# Patient Record
Sex: Male | Born: 1945 | Race: White | Hispanic: No | Smoking: Current every day smoker
Health system: Southern US, Community
[De-identification: ages and names within clinical notes are randomized; demographics above are authoritative.]

## PROBLEM LIST (undated history)

## (undated) ENCOUNTER — Emergency Department (HOSPITAL_COMMUNITY): Discharge status: Against Medical Advice | Payer: Self-pay

## (undated) DIAGNOSIS — F1020 Alcohol dependence, uncomplicated: Secondary | ICD-10-CM

## (undated) DIAGNOSIS — I10 Essential (primary) hypertension: Secondary | ICD-10-CM

---

## 1998-11-08 ENCOUNTER — Encounter: Payer: Self-pay | Admitting: Orthopedic Surgery

## 1998-11-11 ENCOUNTER — Observation Stay (HOSPITAL_COMMUNITY): Admission: RE | Admit: 1998-11-11 | Discharge: 1998-11-12 | Payer: Self-pay | Admitting: Orthopedic Surgery

## 2001-11-03 ENCOUNTER — Ambulatory Visit (HOSPITAL_COMMUNITY): Admission: RE | Admit: 2001-11-03 | Discharge: 2001-11-04 | Payer: Self-pay | Admitting: Cardiovascular Disease

## 2001-11-03 ENCOUNTER — Encounter: Payer: Self-pay | Admitting: Cardiovascular Disease

## 2007-08-15 ENCOUNTER — Emergency Department (HOSPITAL_COMMUNITY): Admission: EM | Admit: 2007-08-15 | Discharge: 2007-08-15 | Payer: Self-pay | Admitting: Emergency Medicine

## 2008-06-27 ENCOUNTER — Ambulatory Visit: Payer: Self-pay | Admitting: Cardiology

## 2008-06-27 ENCOUNTER — Inpatient Hospital Stay (HOSPITAL_COMMUNITY): Admission: EM | Admit: 2008-06-27 | Discharge: 2008-06-28 | Payer: Self-pay | Admitting: Emergency Medicine

## 2008-07-19 DIAGNOSIS — J449 Chronic obstructive pulmonary disease, unspecified: Secondary | ICD-10-CM | POA: Insufficient documentation

## 2008-07-19 DIAGNOSIS — I10 Essential (primary) hypertension: Secondary | ICD-10-CM | POA: Insufficient documentation

## 2008-07-19 DIAGNOSIS — J42 Unspecified chronic bronchitis: Secondary | ICD-10-CM

## 2008-12-28 ENCOUNTER — Emergency Department (HOSPITAL_COMMUNITY): Admission: EM | Admit: 2008-12-28 | Discharge: 2008-12-29 | Payer: Self-pay | Admitting: Emergency Medicine

## 2009-05-23 IMAGING — CT CT ABDOMEN W/ CM
1 of 3 series · 14 of 32 positions shown, 19 images · IV contrast (agent unspecified)
Comparison: None

CT ABDOMEN

CLINICAL DATA: Nominal pain and diarrhea.

CT ABDOMEN AND PELVIS WITH CONTRAST
TECHNIQUE: Multidetector CT imaging of the abdomen and pelvis was
performed using the standard protocol following bolus
administration of intravenous contrast.
Contrast: 100 ml Imnipaque-W77 and oral contrast

[Series 2: abd_pel 5.0 b40f st · axial · 0.72mm/px · z∈[-465,-20]mm · 14 of 99 slices shown, 19 images]
[im 5/99  soft-tissue]
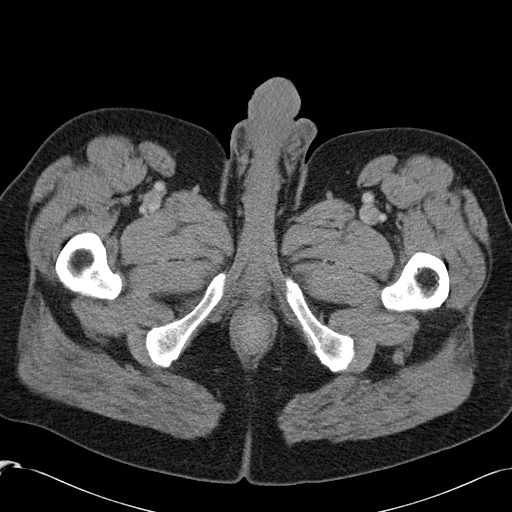
[im 5/99  bone]
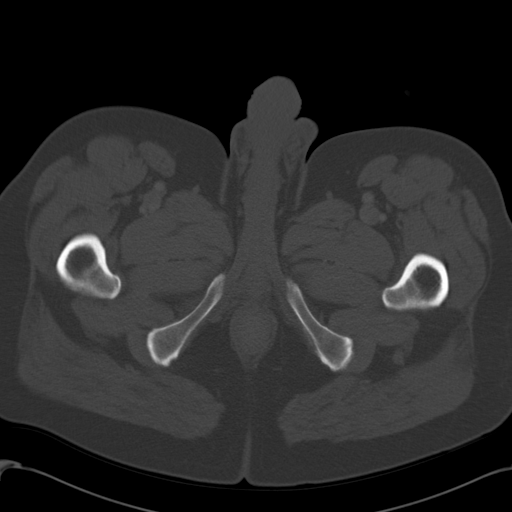
[im 15/99  soft-tissue]
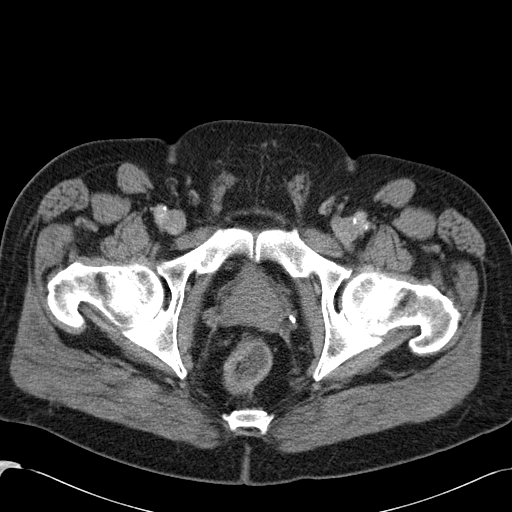
[im 20/99  soft-tissue]
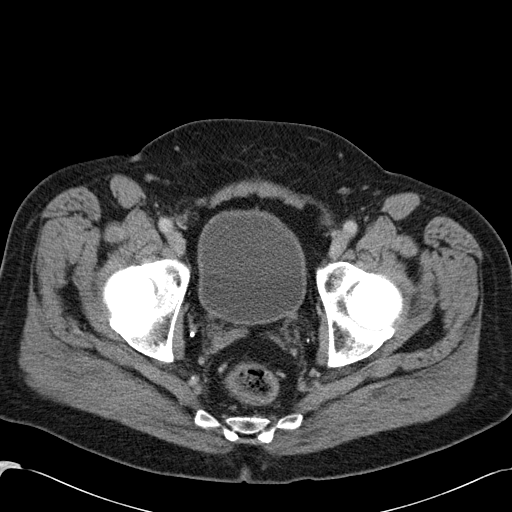
[im 30/99  soft-tissue]
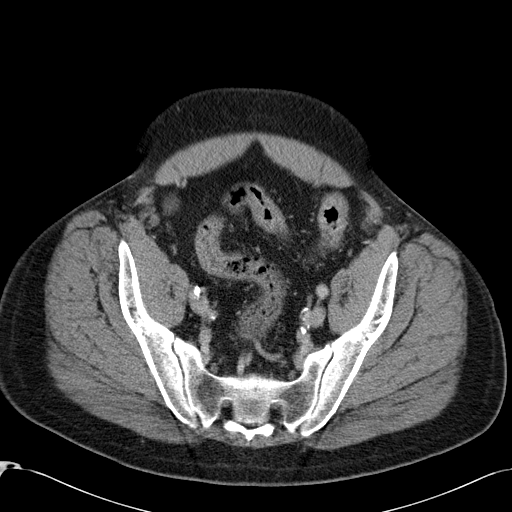
[im 35/99  soft-tissue]
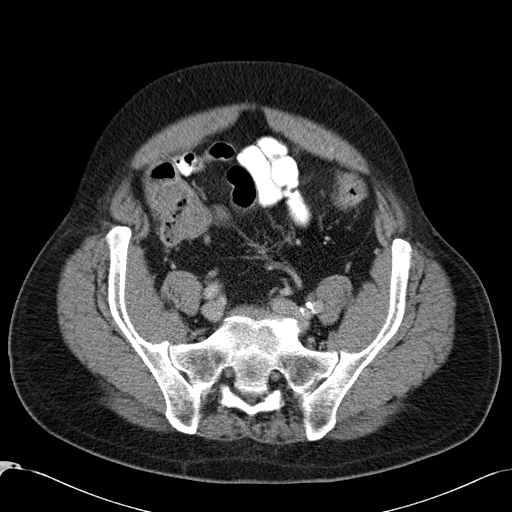
[im 45/99  soft-tissue]
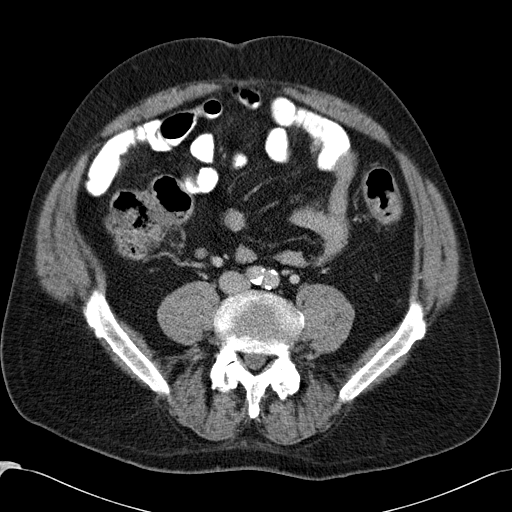
[im 50/99  soft-tissue]
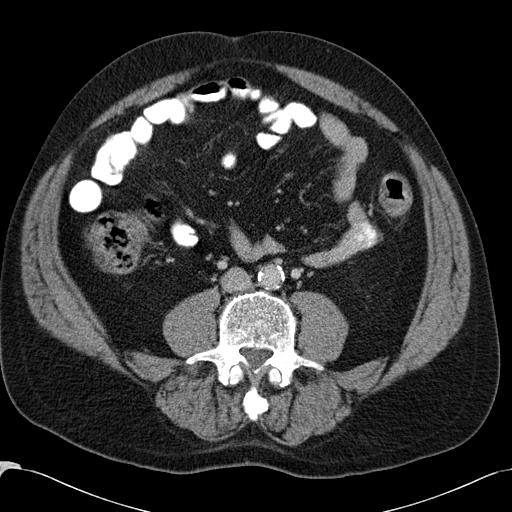
[im 54/99  soft-tissue]
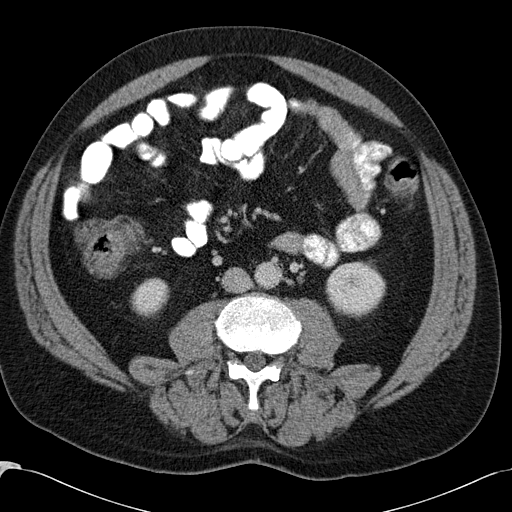
[im 64/99  soft-tissue]
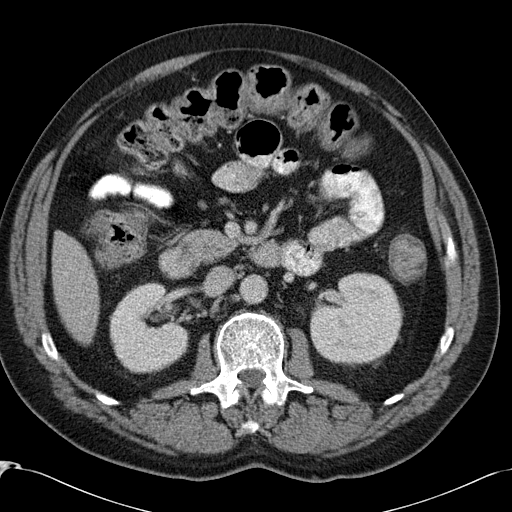
[im 64/99  bone]
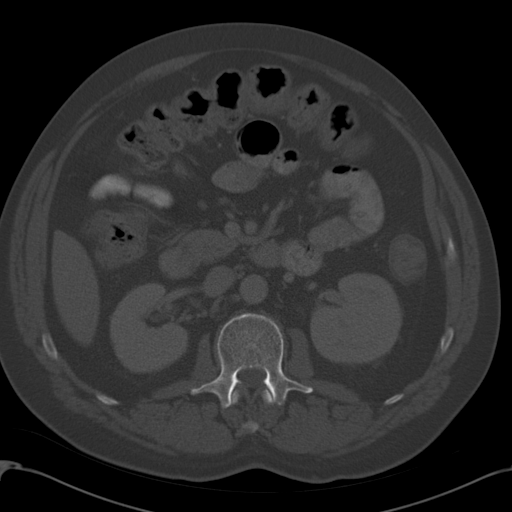
[im 69/99  soft-tissue]
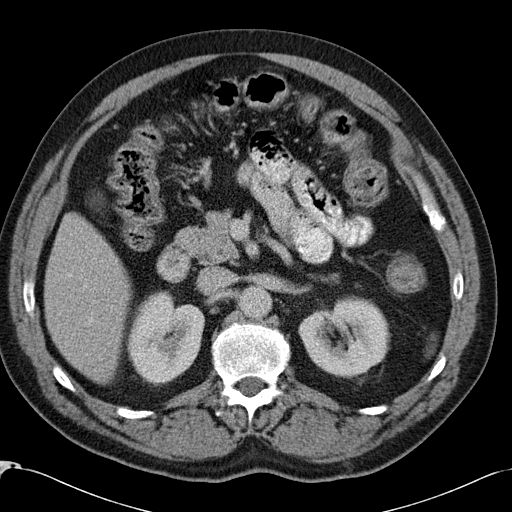
[im 79/99  soft-tissue]
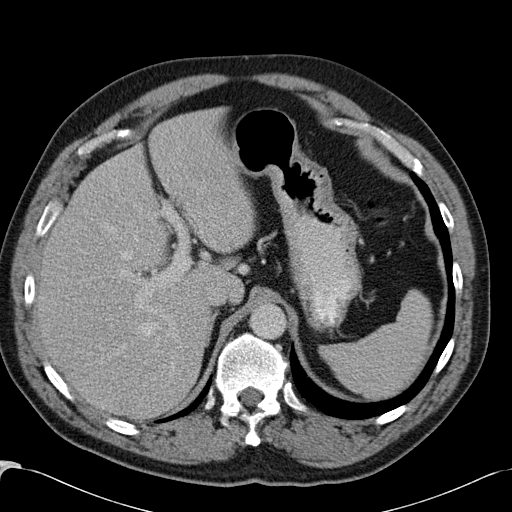
[im 79/99  lung]
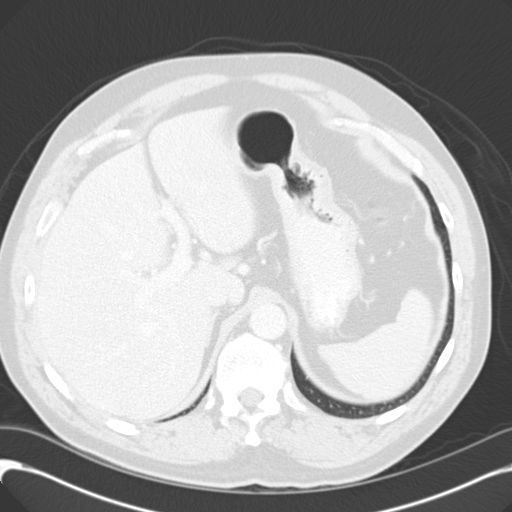
[im 84/99  soft-tissue]
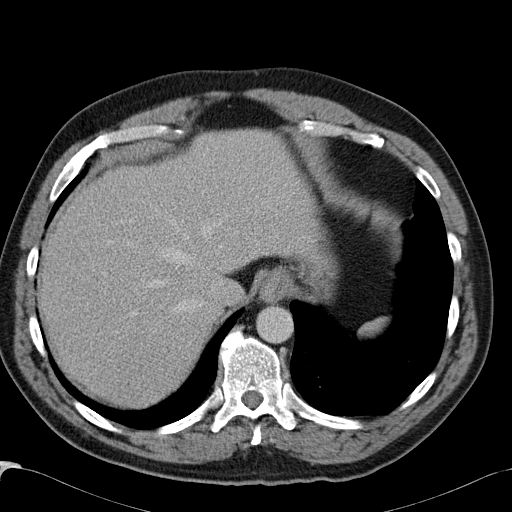
[im 84/99  lung]
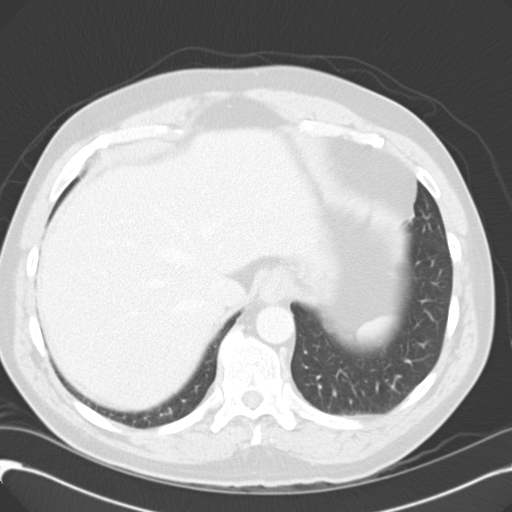
[im 89/99  lung]
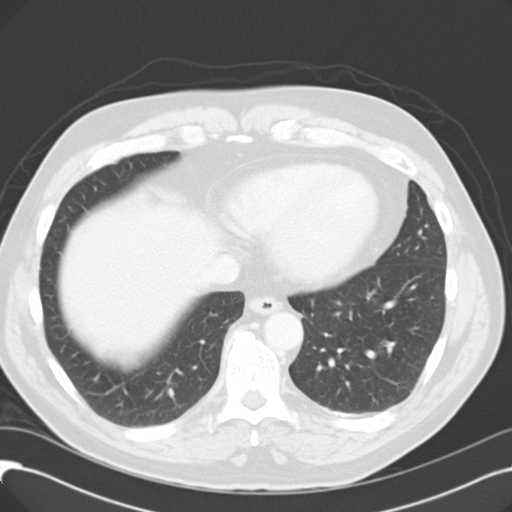
[im 94/99  soft-tissue]
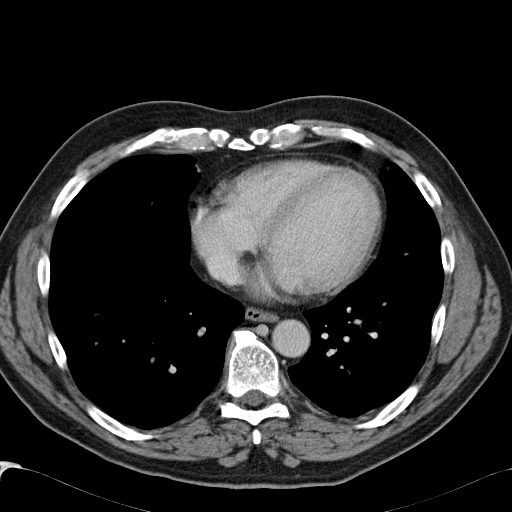
[im 94/99  lung]
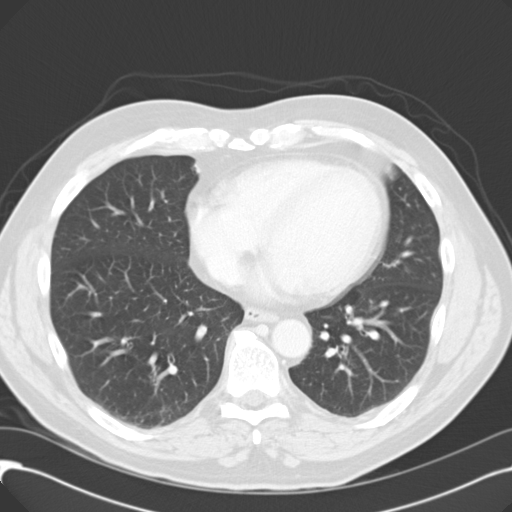

[14 of 32 positions shown; findings below may reference images not displayed]

FINDINGS: A large calcified granulomas seen in the posterior right
lower lobe.  A tiny less than 1 cm cyst is seen in the lower pole
right kidney.  The kidneys are otherwise normal appearance there is
no evidence of hydronephrosis.  The other abdominal parenchymal
organs are normal appearance.

There is no evidence of soft tissue masses or adenopathy.  There is
no evidence of inflammatory process or abnormal fluid collections.
Abdominal bowel loops are unremarkable appearance.
IMPRESSION: No acute findings.

CT PELVIS
FINDINGS: There is no evidence of pelvic mass or lymphadenopathy.
There is no evidence of inflammatory process or abnormal fluid
collections.  Pelvic bowel loops are unremarkable in appearance.
IMPRESSION: No acute findings.

## 2010-04-05 IMAGING — CR DG CHEST 1V PORT
1 series · 1 of 1 positions shown · non-contrast
Comparison: None

CLINICAL DATA: Chest pain.

PORTABLE CHEST - 1 VIEW

[AP]
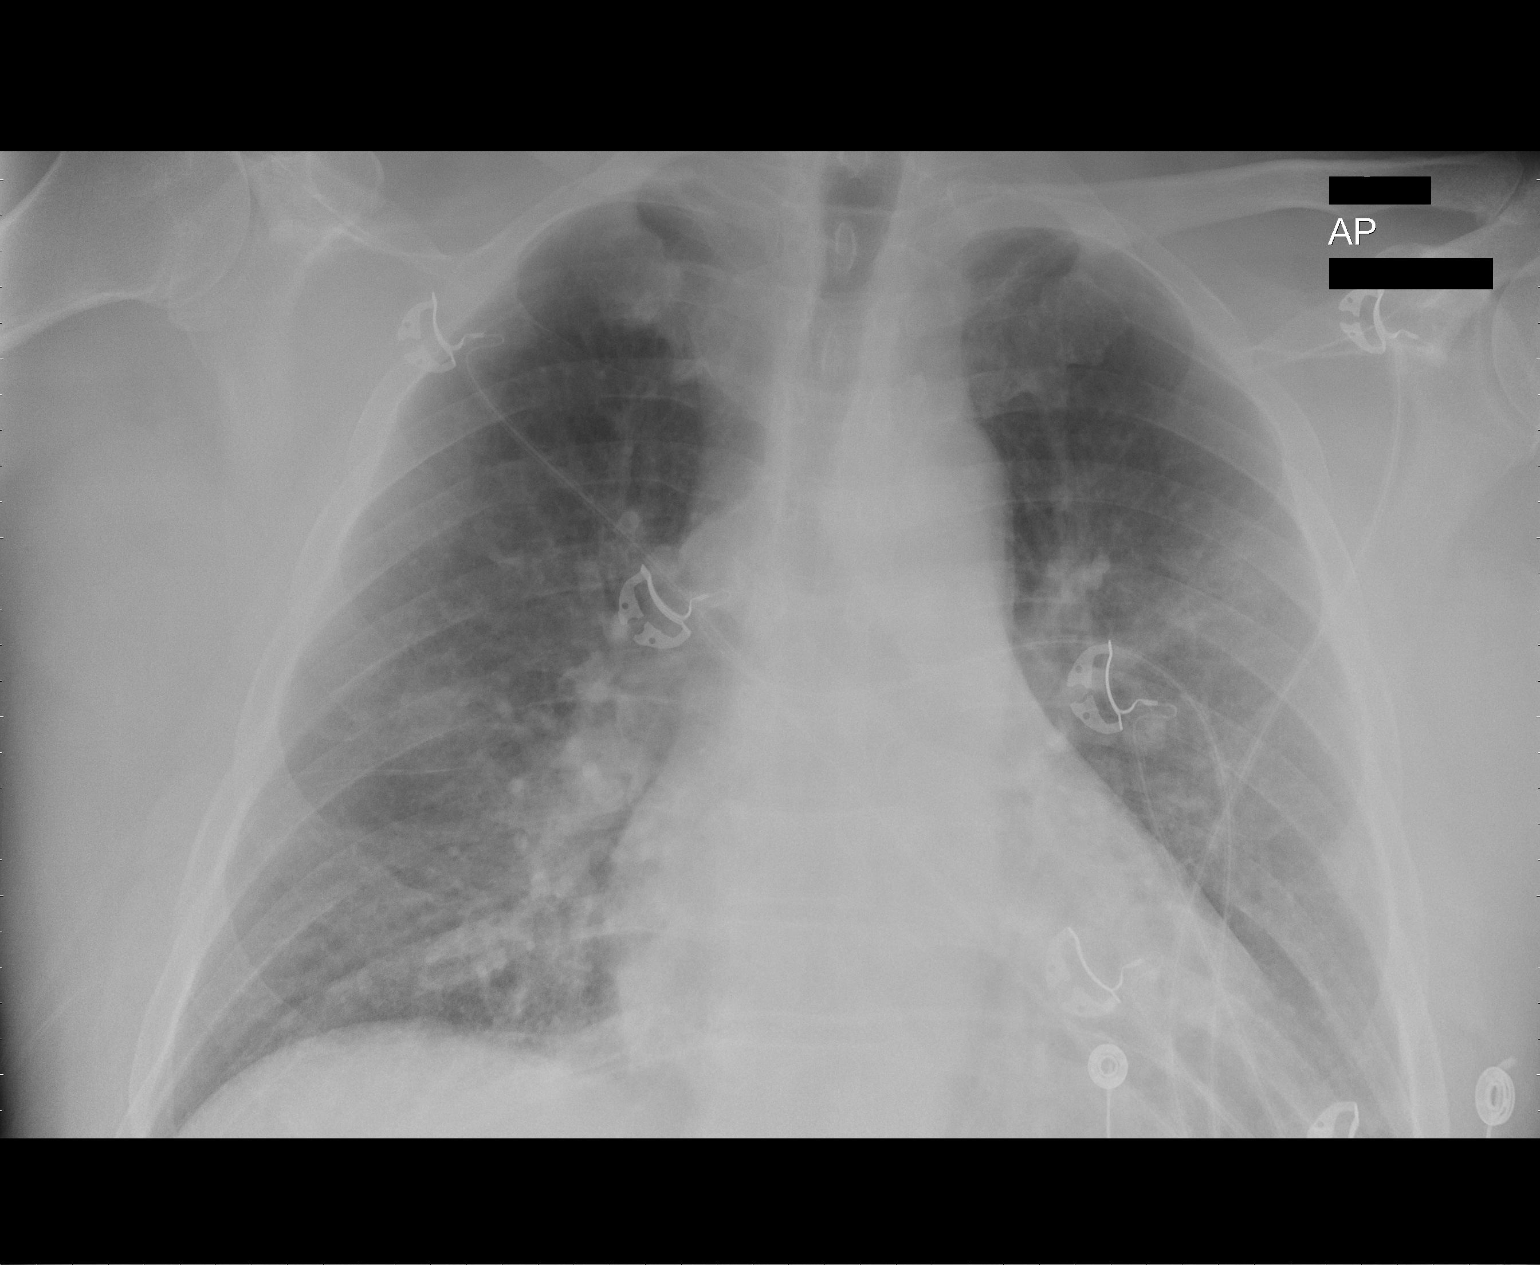

[1 of 1 positions shown; findings below may reference images not displayed]

FINDINGS: Cardiomegaly and moderate vascular congestion.  No
pulmonary edema or infiltrate.
IMPRESSION: Cardiomegaly and vascular congestion.

## 2010-07-09 LAB — DIFFERENTIAL
Eosinophils Absolute: 0 10*3/uL (ref 0.0–0.7)
Eosinophils Relative: 0 % (ref 0–5)
Lymphs Abs: 0.9 10*3/uL (ref 0.7–4.0)
Monocytes Relative: 4 % (ref 3–12)

## 2010-07-09 LAB — CARDIAC PANEL(CRET KIN+CKTOT+MB+TROPI)
CK, MB: 35.4 ng/mL — ABNORMAL HIGH (ref 0.3–4.0)
CK, MB: 39.3 ng/mL — ABNORMAL HIGH (ref 0.3–4.0)
Relative Index: 13.9 — ABNORMAL HIGH (ref 0.0–2.5)
Total CK: 270 U/L — ABNORMAL HIGH (ref 7–232)

## 2010-07-09 LAB — LIPID PANEL
HDL: 48 mg/dL
Total CHOL/HDL Ratio: 4.2 ratio
Triglycerides: 64 mg/dL
VLDL: 13 mg/dL (ref 0–40)

## 2010-07-09 LAB — COMPREHENSIVE METABOLIC PANEL
ALT: 25 U/L (ref 0–53)
AST: 30 U/L (ref 0–37)
Albumin: 3.2 g/dL — ABNORMAL LOW (ref 3.5–5.2)
Calcium: 8.5 mg/dL (ref 8.4–10.5)
GFR calc Af Amer: >60 mL/min (ref >60)
Sodium: 136 mEq/L (ref 135–145)
Total Protein: 6 g/dL (ref 6.0–8.3)

## 2010-07-09 LAB — CBC
MCHC: 35 g/dL (ref 30.0–36.0)
RDW: 13.9 % (ref 11.5–15.5)

## 2010-07-09 LAB — HEMOGLOBIN A1C
Hgb A1c MFr Bld: 5.5 % (ref 4.6–6.1)
Mean Plasma Glucose: 111 mg/dL

## 2010-07-09 LAB — PROTIME-INR: INR: 1 (ref 0.00–1.49)

## 2010-07-09 LAB — GLUCOSE, CAPILLARY: Glucose-Capillary: 139 mg/dL — ABNORMAL HIGH (ref 70–99)

## 2010-07-09 LAB — TSH: TSH: 0.27 u[IU]/mL — ABNORMAL LOW (ref 0.350–4.500)

## 2010-07-10 LAB — POCT I-STAT, CHEM 8
Calcium, Ion: 0.97 mmol/L — ABNORMAL LOW (ref 1.12–1.32)
Chloride: 102 mEq/L (ref 96–112)
HCT: 49 % (ref 39.0–52.0)
Hemoglobin: 16.7 g/dL (ref 13.0–17.0)

## 2010-07-10 LAB — POCT CARDIAC MARKERS: Troponin i, poc: <0.05 ng/mL (ref 0.00–0.09)

## 2010-08-12 NOTE — Discharge Summary (Signed)
NAME:  NAS, WAFER NO.:  [id number]   MEDICAL RECORD NO.:  [id number]          PATIENT TYPE:  INP   LOCATION:  2913                         FACILITY:  [facility]   PHYSICIAN:  Luis Abed, MD, FACCDATE OF BIRTH:  03/16/1946   DATE OF ADMISSION:  06/27/2008  DATE OF DISCHARGE:  06/28/2008                               DISCHARGE SUMMARY   CARDIOLOGIST:  Luis Abed, MD, [facility].   PREVIOUS CARDIOLOGIST:  Richard A. Alanda Amass, MD   DISCHARGE DIAGNOSES:  1. Coronary artery disease/ST elevated myocardial infarction this      admission.  The patient with acute myocardial infarction due to      posterior lateral ruptured plaque, underwent cardiac      catheterization on day of admission with successful percutaneous      angioplasty only of the small vessel.  He has diffuse left coronary      artery disease.  See cardiac catheterization report for further      details.  Note, the patient is not clinically ready for discharge,      but is insistent on leaving and is being discharged with      prescriptions provided.  2. Alcoholism, ongoing.  3. Tobacco abuse, ongoing.  4. Medical noncompliance.  The patient is apparently on medications at      home prescribed through the [place], but he is not taking them.   PAST MEDICAL HISTORY:  1. Anxiety.  2. Previous cardiac catheterization in 2003.      a.     Coronary artery disease with high-grade mid circumflex       stenosis, culprit lesion treated successfully with percutaneous       transluminal angioplasty and drug-eluting stent in 2003.  3. Ongoing tobacco abuse.  4. GERD.  5. EtOH abuse.   HOSPITAL COURSE:  [name] Oneal is a 65 year old Caucasian gentleman with  previous history as stated above.  He is an unfortunate gentleman.  He  is an alcoholic.  He is a previous [place] veteran with anxiety issues  who presented to the emergency room with an acute MI.  The patient was  stabilized and taken to the cardiac  catheterization lab.  The patient  with improvement in the ST elevated MI as medications were started.  In  the cardiac catheterization lab, the patient was found to have diffuse  30% plaquing to the proximal vessel of the RCA, moderate diffuse  irregularity distally with another 30% area of narrowing, mild  irregularity in the PDA, the posterior lateral branch moderate size  vessel, 2 tiny branches and moderate-sized branch that had subtotal  occlusion following the dilatation with a 2-mm balloon, this was reduced  to less than 50% with evidence of TIMI-III flow.  Left main coronary was  calcified and tapered.  There is both ostial circumflex and LAD disease.  The distal left main was about 40% narrow, ostium of the LAD had 50-60%  narrowing that opens with aneurysmal dilatation, the diagonal itself had  50% narrowing, circumflex mild irregularity.  There was probably 30-40%  mid circumflex plaquing, the stent  placed in the second of the larger  marginal branches was widely patent.  EF around 60%.  The patient with  acute myocardial infarction due to posterior lateral ruptured plaque  underwent successful percutaneous angioplasty of the small vessel only  as described above.  The patient with multiple lifestyle issues, will  need close follow up at the [facility] for further management.  The patient  admitted to Intensive Care Unit from the cath lab, remained stable post-  cath entirely, and was discontinued.  Nitroglycerin was weaned off,  major concern for DTs in the setting of EtOH abuse.  The patient  remained tachycardic.  Blood pressure stable.  The patient adamant and  agitated, became combative to pulling out IVs, difficult to manage,  security notified to help monitor the patient's status, attempted  tobacco cessation educator, but the patient is not cooperative.  On the  evening of June 28, 2008, the patient stable, demanding to go home,  being obtuse and belligerent with staff, Dr.  Myrtis Ser in to see the patient.  The patient will not tolerate being in the hospital.  He felt the  patient would be better off at home with his family, therefore, the  patient is being given instructions and prescriptions and being allowed  to return home.  He will need to follow up outpatient with the [place].  I  have written for the patient 20 tablets of Ativan, to follow a detox  protocol, which has been specifically written out on his pink discharge  instructions as to how to taper the dosing.  We will not be giving him  any refills on this medication.  He will have to follow up with the [place].   He is instructed to continue aspirin 325, Lipitor 80, and Toprol-XL 50,  nitroglycerin p.r.n. I have given him prescriptions for 30 day supply on  the Lipitor and Toprol and 25 tablets of the nitroglycerin, Ativan for  alcohol detox is prescribed.  [facility] [place]  Post-cardiac catheterization instructions have been given to the patient  also.   Duration of discharge encounter is over 30 minutes.      [name] Oneal, ACNP      Luis Abed, MD, [facility]  Electronically Signed    MB/MEDQ  D:  06/28/2008  T:  06/29/2008  Job:  [id number]   cc:   [facility]

## 2010-08-12 NOTE — Cardiovascular Report (Signed)
NAME:  LENIS, NETTLETON NO.:  [id number]   MEDICAL RECORD NO.:  [id number]           PATIENT TYPE:   LOCATION:                                 FACILITY:   PHYSICIAN:  Arturo Morton. Riley Kill, MD, FACCDATE OF BIRTH:  Jan 24, 1946   DATE OF PROCEDURE:  DATE OF DISCHARGE:                            CARDIAC CATHETERIZATION   INDICATIONS:  Mr. Bulman is a 65 year old who has previously undergone  stenting of the circumflex artery.  He is followed at the [place].  He  presented to the emergency room with chest pain and some isolated ST  elevation in the inferior leads.  He was called as a code STEMI, and was  promptly seen by Dr. Myrtis Ser and myself.  At the same time, Dr. Margretta Ditty  had a second patient with marked ST elevation in the anterior precordial  leads, with back pain that he sent for a CT scan.  The patient also had  ongoing chest pain and the EKG was suggestive of anterior infarction.  As a result, the cath lab team had been called in, and the patient based  on the electrocardiogram was taken promptly to the catheterization  laboratory.  In the meantime, Dr. Arvilla Meres joined Dr. Myrtis Ser.  They began to arrange for a second call team while we were completing  the first anterior wall infarction case.  The patient was brought to the  catheterization laboratory.  Of note, the patient had some modification  of his pain, and improvement in his ST segments.  His EKG was not  considered a high-risk inferior infarction.  He was brought to the  laboratory by the second call team, and the case was started by Dr.  Arvilla Meres.  The patient arrived to the [facility] Emergency Room at  1854.  The patient arrived in the cath lab at 2055.  Case was started by  Dr. Gala Romney at 2107.  I came over from the other laboratory at 2138  and the first balloon was placed to 2150.  Delay in treatment was  related to one team with 2 acute myocardial infarctions.   PROCEDURE:  1. Left heart  catheterization.  2. Selective coronary arteriography.  3. Selective left ventriculography.  4. Intracoronary nitroglycerin administration.  5. Percutaneous angioplasty of the posterolateral artery and the right      coronary artery.   DESCRIPTION OF PROCEDURE:  The patient was brought to the  catheterization laboratory.  The case was started by Dr. Gala Romney.  He  entered the right femoral artery through a single anterior puncture.  A  6-French sheath was placed.  There was some oozing around the sheath,  and a moderate hematoma developed.  Views of the left and right coronary  arteries were obtained as well as ventriculography.  Intracoronary  nitroglycerin was administered in the posterolateral branch.  Following  this, he and I discussed the case and I elected to recommend a  percutaneous angioplasty of the posterolateral vessel.  The patient had  already received both unfractionated heparin and eptifibatide 50 units  per kilo were given to prolong the ACT  greater than 200 seconds.  The 6-  French sheath was upgraded to a 7-French sheath because of the using.  A  JR-4 guiding catheter with side holes was utilized.  ACT was rechecked  and found to be appropriate for percutaneous intervention.  The patient  also received 300 mg of oral clopidogrel.  A second bolus of entire room  was administered.  We then used a traverse wire to cross the total  occlusion and dilatations were done with both a 1.5-mm apex and 2.0-mm  apex balloons.  The latter was done for 3 minutes, and after  intracoronary nitroglycerin there was fairly marked improvement in the  appearance of the artery.  Given the 2-mm vessel, we elected not to  place a stent.  His pain was relieved, and he was markedly improved.  All catheters were subsequently removed, and the femoral sheath was sewn  into place.  There were no major complications.   HEMODYNAMIC DATA:  1. Initial central aortic pressure 169/89, mean 120.  2.  Left ventricular pressure 177/21 with an LVEDP of 30.  3. There was no gradient on pullback across the aortic valve noted.   ANGIOGRAPHIC DATA:  1. The right coronary artery is the infarct-related artery.  There is      diffuse 30% plaquing throughout the proximal vessel.  There are      some moderate diffuse irregularity distally with another 30% area      of narrowing.  There is mild irregularity in the mid PDA.  The      posterolateral branch is a moderate-sized vessel that is 2 mm.      There are 2 tiny branches and a moderate-sized branch that has a      subtotal occlusion.  Following the dilatation with a 2-mm balloon,      this was reduced to less than 50% with evidence of TIMI III flow.  2. The left main coronary artery is calcified and tapers so that there      is both ostial circumflex and LAD disease.  The distal left main is      about 40% narrow.  3. The ostium of the left anterior descending artery has about 50-60%      narrowing that opens up with aneurysmal dilatation.  The ostial      lesion is calcified.  There is another 30% area of narrowing      leading in the LAD at the takeoff of the diagonal.  The diagonal      itself has 50% narrowing.  At intramyocardial portion of the LAD      distally, there is about 40% narrowing.  4. The circumflex ostium has mild irregularity and then there is      diffuse luminal irregularity prior to bifurcation into 2 large      marginal branches.  There is a smaller marginal branch and a small      intermediate vessel as well, none of which have critical disease      but diffuse luminal irregularities.  There probably 30-40% mid      circumflex plaquing.  The stent placed in the second of the larger      marginal branches is widely patent with less than 0% narrowing.   Ventriculography was performed in the RAO projection.  Overall, systolic  function was preserved.  There was a very small wall motion abnormality.  Ejection fraction  would be estimated at around 60%.   CONCLUSION:  1. Acute myocardial infarction due to posterolateral ruptured plaque.  2. Successful percutaneous angioplasty only of the small vessel.  3. Diffuse left coronary artery disease as described in the above      text.   DISPOSITION:  The patient has multiple lifestyle issues.  His mother  admits that he has some difficulty with good lifestyle habits.  He will  need followup at the [place].      Arturo Morton. Riley Kill, MD, [facility]  Electronically Signed     TDS/MEDQ  D:  06/27/2008  T:  06/28/2008  Job:  [id number]

## 2010-08-12 NOTE — H&P (Signed)
NAME:  [name] Oneal, [name] NO.:  [id number]   MEDICAL RECORD NO.:  [id number]          PATIENT TYPE:  EMS   LOCATION:  MAJO                         FACILITY:  [facility]   PHYSICIAN:  Luis Abed, MD, FACCDATE OF BIRTH:  [birth year]   DATE OF ADMISSION:  06/27/2008  DATE OF DISCHARGE:                              HISTORY & PHYSICAL   Mr. [name] Oneal presented to the emergency room with an acute MI.  He has a  history of coronary disease.  In 2003, he received a Cypher stent to the  circumflex.  He had normal LV function at that time.  The patient  continues to smoke.  Currently, he is smoking 1-1/2 packs per day.  He  does also drink alcohol.  He is very honest about it and admits to one  pint per day.  He has chronic bronchitis.  However, recently he has been  feeling very well.  On June 26, 2008, he began having chest pain in the  evening.  He had pain throughout the evening.  Then late today on June 27, 2008, he felt worse and came to the hospital.  It is difficult to  say if there was a definite time to the change in his pain pattern, but  it may have been at 5 o'clock this afternoon.  He presented into the  emergency room in the range of 7.  His EKG shows inferior ST elevation  compatible with an inferior MI.  The patient was managed very rapidly.  With the addition of aspirin, heparin and nitroglycerin his pain began  to improve.  Therefore, a decision was made to continue giving him meds  to stabilize his situation.  Lytic therapy was considered, but for  several reasons it was felt that this was not appropriate.  Because he  was improving and because there was a simultaneous acute anterior MI  with pulmonary edema, decision is made on the emergent basis to continue  to watch his improvement on medications over the next short period of  time.   PAST MEDICAL HISTORY:   ALLERGIES:  NO KNOWN DRUG ALLERGIES.   MEDICATIONS:  Aspirin 81 mg.   OTHER MEDICAL PROBLEMS:   Hypertension and smoking.   SOCIAL HISTORY:  The patient lives in the area with his wife.   FAMILY HISTORY:  Positive for coronary disease.   REVIEW OF SYSTEMS:  He has not had any fevers or chills.  He is not  having any headaches.  He has not had skin rashes.  He does have  problems with chronic bronchitis.  There has been no change in his  weight.  He has no visual changes or hearing changes.  He has had no  significant GI symptoms.  There is no GU symptoms.  He had no major  musculoskeletal problems.  All other review of systems were interviewed  an asked and are all negative.   PHYSICAL EXAMINATION:  VITAL SIGNS:  Initially blood pressure was  extremely high at 224/118.  It is coming down progressively and  currently in the range of 160/80, and  his pulse is now in the range of  70.  GENERAL:  The patient is oriented to person, time and place.  Affect is  normal.  His wife is here with him in the room.  HEENT:  Reveals no xanthelasma.  He has normal extraocular motion.  NECK:  There are no carotid bruits.  There is no jugulovenous  distention.  LUNGS:  Clear.  Respiratory effort is not labored.  CARDIAC:  Reveals S1-S2.  There are no clicks or significant murmurs.  ABDOMEN:  Soft.  EXTREMITIES:  He has no significant peripheral edema.   LABORATORY DATA:  BUN is 13.  Creatinine 1.1.  Hemoglobin is 16.7.  His  first EKG reveals 2 mm of inferior ST elevation with some reciprocal ST  depression.  His most recent EKG was in the range of 1 mm of ST  elevation and soon another repeat will be done.   PROBLEMS:  1. Hypertension, severe with admission, improving.  2. History of coronary disease, status post circumflex stent with a      Cypher stent in 2003.  3. History of good left ventricular function.  4. History of smoking that continues at 1-1/2 packs per day.  5. History of alcohol use.  He still drinks one pint per day.  We will      need to follow alcohol withdrawal  precautions carefully.  6. Chronic bronchitis.  7. ** Acute inferior ST elevation myocardial infarction with continued      improvement in the ST-segment elevation as medications are started.      The patient will be treated very carefully and aggressively over      the next 30 minutes to 1 hour as decisions are made on the best      approach to his care considering all factors, including the fact      that there is an acute anterior MI in the cath lab with pulmonary      edema.  The patient has received aspirin and IV heparin.  There is      IV nitroglycerin.  He is getting Integrilin at this time.  Beta-      blocker is being used because he is tachycardiac.      Luis Abed, MD, [facility]  Electronically Signed     JDK/MEDQ  D:  06/27/2008  T:  06/27/2008  Job:  [id number]

## 2010-08-15 NOTE — Cardiovascular Report (Signed)
NAME:  [name] Oneal, [name] Oneal                    ACCOUNT NO.:  [id number]   MEDICAL RECORD NO.:  [id number]                   PATIENT TYPE:  OIB   LOCATION:  2899                                 FACILITY:  [facility]   PHYSICIAN:  Richard A. Alanda Amass, M.D.          DATE OF BIRTH:  12/20/1945   DATE OF PROCEDURE:  11/03/2001  DATE OF DISCHARGE:                              CARDIAC CATHETERIZATION   PROCEDURE:  1. Retrograde central aortic catheterization.  2. Selective coronary angiography pre and post intracoronary nitroglycerin     administration.  3. Left ventricular angiogram, RAO/LAO projection.  4. Abdominal angiogram, hand injection, PA projection.  5. Angiomax bolus plus infusion.  6. Aspirin and Plavix p.o.  7. Percutaneous coronary intervention with percutaneous transluminal     coronary angioplasty and subsequent coronary stenting, high-grade mid     circumflex stenosis.  8. Attempted Perclose, right femoral artery, 6-French single suture closure.   DESCRIPTION OF PROCEDURE:  The patient was brought to the second floor CP  lab in a postabsorptive state after 5 mg Valium p.o. premedication.  He took  an aspirin a day prior to the procedure and aspirin the day of the procedure  and was given three baby aspirin in the laboratory.  Xylocaine, 1%, was used  for local anesthesia.  The RFA was entered with a single anterior puncture  using a #18 thin-walled needle and a 6-French short Daig sidearm sheath were  inserted without difficulty.  Catheterization was done with a 6-French 4 cm  taper, Cordis preformed coronary and pigtail catheters using Omnipaque dye  throughout the procedure.  LV angiogram was done in the RAO and LAO  projection at 25 cc/14 cc per second, 20 cc/12 cc per second.  Pullback  pressure of the CA showed no gradient across the aortic valve.  Subselective  LIMA and left subclavian injection was done with a right coronary catheter  by hand injection.   Abdominal angiogram was done with a pigtail catheter above the level of the  renal arteries by hand injection demonstrating single normal renal arteries  bilaterally and normal-appearing infrarenal abdominal aorta and iliac  bifurcation with no aneurysm.  The patient was given 2 mg of Nubain for  sedation during the diagnostic procedure and given 200 mcg of IC  nitroglycerin with the left coronary repeat injections obtained.   He tolerated the procedure well.   PRESSURES:  1. LV 155/0; LVEDP 18 mmHg.  2. CA 155/85 mmHg.  3. There was no gradient across the aortic valve on catheter pullback.   LEFT VENTRICULOGRAPHY:  LV angiogram in the RAO and LAO projection  demonstrated a vigorously and normally contracting left ventricle with EF  greater than 55% and no segmental wall motion abnormalities.  No mitral  regurgitation was present.   ANGIOGRAPHIC RESULTS:  1. The LIMA was widely patent.  There was no subclavian stenosis.  There was     antegrade left vertebral flow.  2.  Fluoroscopy demonstrated +2 calcification of the proximal LAD, +1     calcification of the proximal circumflex, and +1 calcification of the     proximal RCA.  There was no intracardiac or valvular calcification seen.  3. The main left coronary was normal.  4. The left anterior descending artery had mild to moderate aneurysmal     dilatation segmentally of the proximal LAD approximately 5 mm after its     origin extending before the first septal perforator branch.  There was     some streaming of flow through this but no thrombus present.  Careful     examination in multiple views of the ostial and proximal LAD revealed a     probable normal-appearing segment compared to the referenced LAD beyond     the first diagonal.  There was some minimal haziness in this area but no     thrombus present and good flow.  The remainder of the LAD had no     significant stenosis and coursed to the apex of the heart where it      bifurcated.  The first septal perforator branch arose opposite the large     first diagonal branch which trifurcated and was normal.  5. The circumflex artery gave off two small marginal branches proximally     followed by an atrial branch.  The third marginal branch was moderately     large and bifurcated and was normal.  Just beyond this in the mid     circumflex was a 95% segmental subtotal stenosis.  No thrombus was     evident and there was TIMI-3 flow to the distal circumflex which     bifurcated, was moderately large, and a small PABG branch.  6. The right coronary artery was a large dominant vessel.  There was 30%     narrowing in the proximal, proximal-mid, and mid portion beyond the RV     branch.  The remainder of the vessel was large, widely patent, with no     significant stenosis with good flow to a large bifurcating PLA and PDA.   DISCUSSION:  This 65 year old separated white father of two has a history of  heavy smoking greater than two packs a day, persistent chronic ETOH use.  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  He has been under a lot of  stress caring for his father who has had prior CABG, remote CVAs, and is now  in a nursing home.  The patient has had two weeks, approximately, of  recurrent substernal chest pain compatible with ischemia occurring both at  rest and during a.m. hours.  In coming home from the beach on November 02, 2001, he had prolonged chest pain lasting several hours.  He beat himself  in the chest to help relieve this.  He was concerned and presented to  Urgent Care.  Nonspecific ST-T changes were present on EKG without acute  changes and he was referred to [place] for evaluation.  The patient was quite  anxious although he was concerned.  He voiced to [place] his concern and dislike for physicians and medical therapy, particularly in view of his father's  apparent  poor outcome.  His anxiety was quite clear and he was quite  animated in the office.   We had recommended hospitalization for unstable angina and the patient chose  to  go outside and have a cigarette and consider this.  He did agree to come  into the hospital on the following day and took aspirin on the day prior to  admission.   He did not have any recurrent pain since yesterday.  He has not been on  prior medication and no recent hospitalizations.   The patient has high-grade 95% stenosis in a string-like segmental fashion  of the mid circumflex which appears to be his culprit lesion.  He does have  aneurysmal dilatation of the proximal LAD segmentally.  Just proximal to  this in the ostium and very proximal LAD, this area of the artery has an  adequate lumen.  There does not appear to be any significant stenosis and it  is about the size of the referenced LAD beyond the first diagonal.  It was  recommended that we proceed with PCA and PCI in this setting.   Because of this patient's anxiety, concern about lying in the hospital, and  my concern about his possibility of problems postprocedure including  possibility of getting out of bed early, we felt that we should probably use  Angiomax and provisional IIb/IIIa inhibitors with antiplatelet therapy in  this setting and also consider Perclose closure of his right groin if  possible.  Procedure and risks were previously and again explained to the  patient.  He agreed to proceed with the procedure.   DESCRIPTION OF PROCEDURE:  He was given Angiomax bolus plus infusion, 300 mg  of Plavix in the laboratory, and three baby aspirin.  He was given an  additional 4 mg of Nubain IV for sedation in divided doses.   The left groin area was intubated with a 6-French JL-4 guiding catheter.  The high-grade circumflex lesion was crossed with a HTF 0.014 inch guidewire  which was positioned free in the distal circumflex.  The lesion was   predilated with an ACS 3.5 x 15 mm CrossSail balloon at 5 atmospheres for 41  seconds with adequate expansion.  It was then exchanged for a 2.5 x 13 mm  balloon expandable Cypher DES (Cordis).  This was positioned  fluoroscopically beyond the third marginal branch and deployed at 12  atmospheres for 47 seconds with good balloon expansion.  The balloon was  then pulled back and exchanged for the 2.5 x 15 mm CrossSail and the stent  was post dilated at 10 atmospheres for 53 seconds.  IC nitroglycerin was  administered, 200 mcg.  Final injections demonstrated excellent angiographic  result with stenosis reduction from 95% to less than 10% with good TIMI-3  flow to the distal circumflex and no dissection.  The patient was stable  throughout the procedure.  The dilatation system was removed.  The ACT was  275 seconds.  Angiomax infusion was discontinued.  A 6-French Perclose single suture device was then attempted.  Despite  adequate entrance into the CFA, there was continued oozing at the site.  The  sutures were cut and removed and pressure hemostasis was placed on the right  groin in the lab which the patient tolerated well.  ACTs will be monitored  closely postoperatively.   RECOMMENDATIONS:  Recommend continued medical therapy with the additional of  beta blockers, aspirin, and long-term Plavix in this patient.  He has been  agreeable to be compliant with medication which was addressed  preoperatively, particularly in use of the DES stent.   CATHETERIZATION DIAGNOSES:  1. Arteriosclerotic heart disease, unstable angina pectoris.  2. High-grade  mid circumflex stenosis, culprit lesion, treated successfully     with percutaneous transluminal coronary angioplasty and drug-eluting     stenting, November 03, 2001.  3. Cigarette abuse greater than two packs a day, chronic with chronic     bronchitis.  4. Gastroesophageal reflux disease.  5. Probable hyperlipidemia, laboratory pending.  6.  Chronic ETOH abuse.  7. Anxiety.                                               Richard A. Alanda Amass, M.D.    RAW/MEDQ  D:  11/03/2001  T:  11/08/2001  Job:  [id number]   cc:   Cardiac Catheterization Lab   Darlin Priestly, M.D.

## 2010-08-15 NOTE — Discharge Summary (Signed)
   NAME:  [name] Oneal, [name] Oneal                    ACCOUNT NO.:  [id number]   MEDICAL RECORD NO.:  [id number]                   PATIENT TYPE:  OIB   LOCATION:  6531                                 FACILITY:  [facility]   PHYSICIAN:  Richard A. Alanda Amass, M.D.          DATE OF BIRTH:  1945-06-29   DATE OF ADMISSION:  11/03/2001  DATE OF DISCHARGE:  11/04/2001                                 DISCHARGE SUMMARY   DISCHARGE DIAGNOSES:  1. Unstable angina.  2. Coronary disease status post circumflex intervention with CYPHER stent     this admission.  3. History of smoking.  4. History of alcohol abuse.  5. Anxiety.   HOSPITAL COURSE:  The patient is a 66 year old male who was sent to [place] from  [facility].  He presented there with complaints of chest pain  while driving back from the beach.  His symptoms were consistent with  unstable angina.  He was very anxious and did not want to be admitted.  We  set him up for same-day catheterization the next day.  This was done November 03, 2001 by Dr. Alanda Amass and revealed 95% mid-circumflex lesion that was  dilated and stented.  His EF was 55%.  Proximal LAD had an aneurysmal  dilatation but no significant stenosis.  RCA had 30% narrowing.  We feel he  can be discharged November 04, 2001.  He will follow up with Dr. Alanda Amass in a  few weeks in the office.   DISCHARGE MEDICATIONS:  1. Baby aspirin once a day.  2. Plavix 75 mg a day.  3. Nexium 40 mg a day.  4. Zyban 150 mg twice a day.  5. Foltx once a day.  6. Nitroglycerin sublingual p.r.n.  7. Zocor 20 mg a day.   He is not on a beta blocker; his heart rate was in the 50s in the hospital.   LABORATORIES:  Hematology shows a white count 9.6, hemoglobin 14.3,  hematocrit 42.3, platelets 245.  INR 0.9.  Sodium 139, potassium 2.9, BUN 9,  creatinine 0.8.  TSH 0.44.  Lipid profile is pending.  Chest x-ray showed no  active disease.  EKG showed sinus rhythm, sinus bradycardia without  acute  changes.   DISPOSITION:  The patient is discharged in stable condition and will follow  up in our office in a couple weeks.  We did talk to him about smoking and he  promises to give this serious consideration.  He has also been instructed  not to do any heavy lifting or strenuous activity for at least 48 hours.     Abelino Derrick, P.A.                      Richard A. Alanda Amass, M.D.    Lenard Lance  D:  11/04/2001  T:  11/09/2001  Job:  [id number]

## 2010-12-24 LAB — DIFFERENTIAL
Basophils Absolute: 0
Basophils Relative: 0
Eosinophils Absolute: 0.1
Monocytes Absolute: 1.5 — ABNORMAL HIGH
Neutro Abs: 14.5 — ABNORMAL HIGH
Neutrophils Relative %: 79 — ABNORMAL HIGH

## 2010-12-24 LAB — COMPREHENSIVE METABOLIC PANEL
ALT: 29
Alkaline Phosphatase: 69
BUN: 9
Chloride: 101
Glucose, Bld: 102 — ABNORMAL HIGH
Potassium: 4
Sodium: 137
Total Bilirubin: 0.9

## 2010-12-24 LAB — URINE MICROSCOPIC-ADD ON

## 2010-12-24 LAB — URINALYSIS, ROUTINE W REFLEX MICROSCOPIC
Bilirubin Urine: NEGATIVE
Glucose, UA: NEGATIVE
Hgb urine dipstick: NEGATIVE
Ketones, ur: NEGATIVE
pH: 6

## 2010-12-24 LAB — CBC
HCT: 46.4
Hemoglobin: 16.2
RBC: 4.84
WBC: 18.5 — ABNORMAL HIGH

## 2010-12-24 LAB — URINE CULTURE

## 2014-09-18 ENCOUNTER — Encounter (HOSPITAL_COMMUNITY): Payer: Self-pay

## 2014-09-18 ENCOUNTER — Emergency Department (HOSPITAL_COMMUNITY): Payer: PPO

## 2014-09-18 ENCOUNTER — Emergency Department (HOSPITAL_COMMUNITY)
Admission: EM | Admit: 2014-09-18 | Discharge: 2014-09-18 | Disposition: A | Discharge status: Home / Self Care | Payer: PPO | Attending: Emergency Medicine | Admitting: Emergency Medicine

## 2014-09-18 DIAGNOSIS — Y9289 Other specified places as the place of occurrence of the external cause: Secondary | ICD-10-CM | POA: Diagnosis not present

## 2014-09-18 DIAGNOSIS — Y9389 Activity, other specified: Secondary | ICD-10-CM | POA: Insufficient documentation

## 2014-09-18 DIAGNOSIS — W010XXA Fall on same level from slipping, tripping and stumbling without subsequent striking against object, initial encounter: Secondary | ICD-10-CM | POA: Diagnosis not present

## 2014-09-18 DIAGNOSIS — M62830 Muscle spasm of back: Secondary | ICD-10-CM | POA: Diagnosis not present

## 2014-09-18 DIAGNOSIS — I10 Essential (primary) hypertension: Secondary | ICD-10-CM | POA: Insufficient documentation

## 2014-09-18 DIAGNOSIS — S3992XA Unspecified injury of lower back, initial encounter: Secondary | ICD-10-CM | POA: Insufficient documentation

## 2014-09-18 DIAGNOSIS — Z720 Tobacco use: Secondary | ICD-10-CM | POA: Diagnosis not present

## 2014-09-18 DIAGNOSIS — M545 Low back pain, unspecified: Secondary | ICD-10-CM

## 2014-09-18 DIAGNOSIS — T148 Other injury of unspecified body region: Secondary | ICD-10-CM | POA: Diagnosis not present

## 2014-09-18 DIAGNOSIS — T148XXA Other injury of unspecified body region, initial encounter: Secondary | ICD-10-CM

## 2014-09-18 DIAGNOSIS — F1011 Alcohol abuse, in remission: Secondary | ICD-10-CM

## 2014-09-18 DIAGNOSIS — Y998 Other external cause status: Secondary | ICD-10-CM | POA: Diagnosis not present

## 2014-09-18 HISTORY — DX: Essential (primary) hypertension: I10

## 2014-09-18 HISTORY — DX: Alcohol dependence, uncomplicated: F10.20

## 2014-09-18 MED ORDER — MELOXICAM 7.5 MG PO TABS: 7.5000 mg | ORAL_TABLET | Freq: Every day | ORAL | Status: DC

## 2014-09-18 MED ORDER — MELOXICAM 7.5 MG PO TABS
7.5000 mg | ORAL_TABLET | Freq: Once | ORAL | Status: AC
  Administered 2014-09-18: 7.5 mg via ORAL
  Filled 2014-09-18: qty 1

## 2014-09-18 NOTE — Discharge Instructions (Signed)
Back Pain:  Your back pain should be treated with medicines such as ibuprofen or aleve and this back pain should get better over the next 2 weeks.  However if you develop severe or worsening pain, low back pain with fever, numbness, weakness or inability to walk or urinate, you should return to the ER immediately.  Please follow up with your doctor this week for a recheck if still having symptoms.  Low back pain is discomfort in the lower back that may be due to injuries to muscles and ligaments around the spine.  Occasionally, it may be caused by a a problem to a part of the spine called a disc.  The pain may last several days or a week;  However, most patients get completely well in 4 weeks.  Self - care:  The application of heat can help soothe the pain.  Maintaining your daily activities, including walking, is encourged, as it will help you get better faster than just staying in bed. Perform gentle stretching as discussed. Drink plenty of fluids.  Medications are also useful to help with pain control.  A commonly prescribed medication includes tylenol.  Non steroidal anti inflammatory medications including Ibuprofen and naproxen;  These medications help both pain and swelling and are very useful in treating back pain.  They should be taken with food, as they can cause stomach upset, and more seriously, stomach bleeding.    SEEK IMMEDIATE MEDICAL ATTENTION IF: New numbness, tingling, weakness, or problem with the use of your arms or legs.  Severe back pain not relieved with medications.  Difficulty with or loss of control of your bowel or bladder control.  Increasing pain in any areas of the body (such as chest or abdominal pain).  Shortness of breath, dizziness or fainting.  Nausea (feeling sick to your stomach), vomiting, fever, or sweats.  You will need to follow up with  Your orthopedist in 1-2 weeks for reassessment.   Back Pain, Adult Back pain is very common. The pain often gets  better over time. The cause of back pain is usually not dangerous. Most people can learn to manage their back pain on their own.  HOME CARE   Stay active. Start with short walks on flat ground if you can. Try to walk farther each day.  Do not sit, drive, or stand in one place for more than 30 minutes. Do not stay in bed.  Do not avoid exercise or work. Activity can help your back heal faster.  Be careful when you bend or lift an object. Bend at your knees, keep the object close to you, and do not twist.  Sleep on a firm mattress. Lie on your side, and bend your knees. If you lie on your back, put a pillow under your knees.  Only take medicines as told by your doctor.  Put ice on the injured area.  Put ice in a plastic bag.  Place a towel between your skin and the bag.  Leave the ice on for 15-20 minutes, 03-04 times a day for the first 2 to 3 days. After that, you can switch between ice and heat packs.  Ask your doctor about back exercises or massage.  Avoid feeling anxious or stressed. Find good ways to deal with stress, such as exercise. GET HELP RIGHT AWAY IF:   Your pain does not go away with rest or medicine.  Your pain does not go away in 1 week.  You have new problems.  You  do not feel well.  The pain spreads into your legs.  You cannot control when you poop (bowel movement) or pee (urinate).  Your arms or legs feel weak or lose feeling (numbness).  You feel sick to your stomach (nauseous) or throw up (vomit).  You have belly (abdominal) pain.  You feel like you may pass out (faint). MAKE SURE YOU:   Understand these instructions.  Will watch your condition.  Will get help right away if you are not doing well or get worse. Document Released: 09/02/2007 Document Revised: 06/08/2011 Document Reviewed: 07/18/2013 [facility] Patient Information 2015 [place], [place]. This information is not intended to replace advice given to you by your health care provider.  Make sure you discuss any questions you have with your health care provider.  Back Injury Prevention The following tips can help you to prevent a back injury. PHYSICAL FITNESS  Exercise often. Try to develop strong stomach (abdominal) muscles.  Do aerobic exercises often. This includes walking, jogging, biking, swimming.  Do exercises that help with balance and strength often. This includes tai chi and yoga.  Stretch before and after you exercise.  Keep a healthy weight. DIET   Ask your doctor how much calcium and vitamin D you need every day.  Include calcium in your diet. Foods high in calcium include dairy products; green, leafy vegetables; and products with calcium added (fortified).  Include vitamin D in your diet. Foods high in vitamin D include milk and products with vitamin D added.  Think about taking a multivitamin or other nutritional products called " supplements."  Stop smoking if you smoke. POSTURE   Sit and stand up straight. Avoid leaning forward or hunching over.  Choose chairs that support your lower back.  If you work at a desk:  Sit close to your work so you do not lean over.  Keep your chin tucked in.  Keep your neck drawn back.  Keep your elbows bent at a right angle. Your arms should look like the letter "L."  Sit high and close to the steering wheel when you drive. Add low back support to your car seat if needed.  Avoid sitting or standing in one position for too long. Get up and move around every hour. Take breaks if you are driving for a long time.  Sleep on your side with your knees slightly bent. You can also sleep on your back with a pillow under your knees. Do not sleep on your stomach. LIFTING, TWISTING, AND REACHING  Avoid heavy lifting, especially lifting over and over again. If you must do heavy lifting:  Stretch before lifting.  Work slowly.  Rest between lifts.  Use carts and dollies to move objects when possible.  Make  several small trips instead of carrying 1 heavy load.  Ask for help when you need it.  Ask for help when moving big, awkward objects.  Follow these steps when lifting:  Stand with your feet shoulder-width apart.  Get as close to the object as you can. Do not pick up heavy objects that are far from your body.  Use handles or lifting straps when possible.  Bend at your knees. Squat down, but keep your heels off the floor.  Keep your shoulders back, your chin tucked in, and your back straight.  Lift the object slowly. Tighten the muscles in your legs, stomach, and butt. Keep the object as close to the center of your body as possible.  Reverse these directions when you put  a load down.  Do not:  Lift the object above your waist.  Twist at the waist while lifting or carrying a load. Move your feet if you need to turn, not your waist.  Bend over without bending at your knees.  Avoid reaching over your head, across a table, or for an object on a high surface. OTHER TIPS  Avoid wet floors and keep sidewalks clear of ice.  Do not sleep on a mattress that is too soft or too hard.  Keep items that you use often within easy reach.  Put heavier objects on shelves at waist level. Put lighter objects on lower or higher shelves.  Find ways to lessen your stress. You can try exercise, massage, or relaxation.  Get help for depression or anxiety if needed. GET HELP IF:  You injure your back.  You have questions about diet, exercise, or other ways to prevent back injuries. MAKE SURE YOU:  Understand these instructions.  Will watch your condition.  Will get help right away if you are not doing well or get worse. Document Released: 09/02/2007 Document Revised: 06/08/2011 Document Reviewed: 04/27/2011 [facility] Patient Information 2015 [place], [place]. This information is not intended to replace advice given to you by your health care provider. Make sure you discuss any questions you  have with your health care provider.  Back Exercises Back exercises help treat and prevent back injuries. The goal of back exercises is to increase the strength of your abdominal and back muscles and the flexibility of your back. These exercises should be started when you no longer have back pain. Back exercises include:  Pelvic Tilt. Lie on your back with your knees bent. Tilt your pelvis until the lower part of your back is against the floor. Hold this position 5 to 10 sec and repeat 5 to 10 times.  Knee to Chest. Pull first 1 knee up against your chest and hold for 20 to 30 seconds, repeat this with the other knee, and then both knees. This may be done with the other leg straight or bent, whichever feels better.  Sit-Ups or Curl-Ups. Bend your knees 90 degrees. Start with tilting your pelvis, and do a partial, slow sit-up, lifting your trunk only 30 to 45 degrees off the floor. Take at least 2 to 3 seconds for each sit-up. Do not do sit-ups with your knees out straight. If partial sit-ups are difficult, simply do the above but with only tightening your abdominal muscles and holding it as directed.  Hip-Lift. Lie on your back with your knees flexed 90 degrees. Push down with your feet and shoulders as you raise your hips a couple inches off the floor; hold for 10 seconds, repeat 5 to 10 times.  Back arches. Lie on your stomach, propping yourself up on bent elbows. Slowly press on your hands, causing an arch in your low back. Repeat 3 to 5 times. Any initial stiffness and discomfort should lessen with repetition over time.  Shoulder-Lifts. Lie face down with arms beside your body. Keep hips and torso pressed to floor as you slowly lift your head and shoulders off the floor. Do not overdo your exercises, especially in the beginning. Exercises may cause you some mild back discomfort which lasts for a few minutes; however, if the pain is more severe, or lasts for more than 15 minutes, do not continue  exercises until you see your caregiver. Improvement with exercise therapy for back problems is slow.  See your caregivers for assistance  with developing a proper back exercise program. Document Released: 04/23/2004 Document Revised: 06/08/2011 Document Reviewed: 01/15/2011 [facility] Patient Information 2015 [place], [place]. This information is not intended to replace advice given to you by your health care provider. Make sure you discuss any questions you have with your health care provider.  Contusion A contusion is a deep bruise. Contusions are the result of an injury that caused bleeding under the skin. The contusion may turn blue, purple, or yellow. Minor injuries will give you a painless contusion, but more severe contusions may stay painful and swollen for a few weeks.  CAUSES  A contusion is usually caused by a blow, trauma, or direct force to an area of the body. SYMPTOMS   Swelling and redness of the injured area.  Bruising of the injured area.  Tenderness and soreness of the injured area.  Pain. DIAGNOSIS  The diagnosis can be made by taking a history and physical exam. An X-ray, CT scan, or MRI may be needed to determine if there were any associated injuries, such as fractures. TREATMENT  Specific treatment will depend on what area of the body was injured. In general, the best treatment for a contusion is resting, icing, elevating, and applying cold compresses to the injured area. Over-the-counter medicines may also be recommended for pain control. Ask your caregiver what the best treatment is for your contusion. HOME CARE INSTRUCTIONS   Put ice on the injured area.  Put ice in a plastic bag.  Place a towel between your skin and the bag.  Leave the ice on for 15-20 minutes, 3-4 times a day, or as directed by your health care provider.  Only take over-the-counter or prescription medicines for pain, discomfort, or fever as directed by your caregiver. Your caregiver may  recommend avoiding anti-inflammatory medicines (aspirin, ibuprofen, and naproxen) for 48 hours because these medicines may increase bruising.  Rest the injured area.  If possible, elevate the injured area to reduce swelling. SEEK IMMEDIATE MEDICAL CARE IF:   You have increased bruising or swelling.  You have pain that is getting worse.  Your swelling or pain is not relieved with medicines. MAKE SURE YOU:   Understand these instructions.  Will watch your condition.  Will get help right away if you are not doing well or get worse. Document Released: 12/24/2004 Document Revised: 03/21/2013 Document Reviewed: 01/19/2011 [facility] Patient Information 2015 [place], [place]. This information is not intended to replace advice given to you by your health care provider. Make sure you discuss any questions you have with your health care provider.  Cryotherapy Cryotherapy means treatment with cold. Ice or gel packs can be used to reduce both pain and swelling. Ice is the most helpful within the first 24 to 48 hours after an injury or flare-up from overusing a muscle or joint. Sprains, strains, spasms, burning pain, shooting pain, and aches can all be eased with ice. Ice can also be used when recovering from surgery. Ice is effective, has very few side effects, and is safe for most people to use. PRECAUTIONS  Ice is not a safe treatment option for people with:  Raynaud phenomenon. This is a condition affecting small blood vessels in the extremities. Exposure to cold may cause your problems to return.  Cold hypersensitivity. There are many forms of cold hypersensitivity, including:  Cold urticaria. Red, itchy hives appear on the skin when the tissues begin to warm after being iced.  Cold erythema. This is a red, itchy rash caused by exposure  to cold.  Cold hemoglobinuria. Red blood cells break down when the tissues begin to warm after being iced. The hemoglobin that carry oxygen are passed into  the urine because they cannot combine with blood proteins fast enough.  Numbness or altered sensitivity in the area being iced. If you have any of the following conditions, do not use ice until you have discussed cryotherapy with your caregiver:  Heart conditions, such as arrhythmia, angina, or chronic heart disease.  High blood pressure.  Healing wounds or open skin in the area being iced.  Current infections.  Rheumatoid arthritis.  Poor circulation.  Diabetes. Ice slows the blood flow in the region it is applied. This is beneficial when trying to stop inflamed tissues from spreading irritating chemicals to surrounding tissues. However, if you expose your skin to cold temperatures for too long or without the proper protection, you can damage your skin or nerves. Watch for signs of skin damage due to cold. HOME CARE INSTRUCTIONS Follow these tips to use ice and cold packs safely.  Place a dry or damp towel between the ice and skin. A damp towel will cool the skin more quickly, so you may need to shorten the time that the ice is used.  For a more rapid response, add gentle compression to the ice.  Ice for no more than 10 to 20 minutes at a time. The bonier the area you are icing, the less time it will take to get the benefits of ice.  Check your skin after 5 minutes to make sure there are no signs of a poor response to cold or skin damage.  Rest 20 minutes or more between uses.  Once your skin is numb, you can end your treatment. You can test numbness by very lightly touching your skin. The touch should be so light that you do not see the skin dimple from the pressure of your fingertip. When using ice, most people will feel these normal sensations in this order: cold, burning, aching, and numbness.  Do not use ice on someone who cannot communicate their responses to pain, such as small children or people with dementia. HOW TO MAKE AN ICE PACK Ice packs are the most common way to  use ice therapy. Other methods include ice massage, ice baths, and cryosprays. Muscle creams that cause a cold, tingly feeling do not offer the same benefits that ice offers and should not be used as a substitute unless recommended by your caregiver. To make an ice pack, do one of the following:  Place crushed ice or a bag of frozen vegetables in a sealable plastic bag. Squeeze out the excess air. Place this bag inside another plastic bag. Slide the bag into a pillowcase or place a damp towel between your skin and the bag.  Mix 3 parts water with 1 part rubbing alcohol. Freeze the mixture in a sealable plastic bag. When you remove the mixture from the freezer, it will be slushy. Squeeze out the excess air. Place this bag inside another plastic bag. Slide the bag into a pillowcase or place a damp towel between your skin and the bag. SEEK MEDICAL CARE IF:  You develop white spots on your skin. This may give the skin a blotchy (mottled) appearance.  Your skin turns blue or pale.  Your skin becomes waxy or hard.  Your swelling gets worse. MAKE SURE YOU:   Understand these instructions.  Will watch your condition.  Will get help right away if you  are not doing well or get worse. Document Released: 11/10/2010 Document Revised: 07/31/2013 Document Reviewed: 11/10/2010 [facility] Patient Information 2015 [place], [place]. This information is not intended to replace advice given to you by your health care provider. Make sure you discuss any questions you have with your health care provider.  Heat Therapy Heat therapy can help make painful, stiff muscles and joints feel better. Do not use heat on new injuries. Wait at least 48 hours after an injury to use heat. Do not use heat when you have aches or pains right after an activity. If you still have pain 3 hours after stopping the activity, then you may use heat. HOME CARE Wet heat pack  Soak a clean towel in warm water. Squeeze out the extra  water.  Put the warm, wet towel in a plastic bag.  Place a thin, dry towel between your skin and the bag.  Put the heat pack on the area for 5 minutes, and check your skin. Your skin may be pink, but it should not be red.  Leave the heat pack on the area for 15 to 30 minutes.  Repeat this every 2 to 4 hours while awake. Do not use heat while you are sleeping. Warm water bath  Fill a tub with warm water.  Place the affected body part in the tub.  Soak the area for 20 to 40 minutes.  Repeat as needed. Hot water bottle  Fill the water bottle half full with hot water.  Press out the extra air. Close the cap tightly.  Place a dry towel between your skin and the bottle.  Put the bottle on the area for 5 minutes, and check your skin. Your skin may be pink, but it should not be red.  Leave the bottle on the area for 15 to 30 minutes.  Repeat this every 2 to 4 hours while awake. Electric heating pad  Place a dry towel between your skin and the heating pad.  Set the heating pad on low heat.  Put the heating pad on the area for 10 minutes, and check your skin. Your skin may be pink, but it should not be red.  Leave the heating pad on the area for 20 to 40 minutes.  Repeat this every 2 to 4 hours while awake.  Do not lie on the heating pad.  Do not fall asleep while using the heating pad.  Do not use the heating pad near water. GET HELP RIGHT AWAY IF:  You get blisters or red skin.  Your skin is puffy (swollen), or you lose feeling (numbness) in the affected area.  You have any new problems.  Your problems are getting worse.  You have any questions or concerns. If you have any problems, stop using heat therapy until you see your doctor. MAKE SURE YOU:  Understand these instructions.  Will watch your condition.  Will get help right away if you are not doing well or get worse. Document Released: 06/08/2011 Document Reviewed: 05/09/2013 [facility] Patient  Information 2015 [place]. This information is not intended to replace advice given to you by your health care provider. Make sure you discuss any questions you have with your health care provider.  How Much is Too Much Alcohol? Drinking too much alcohol can cause injury, accidents, and health problems. These types of problems can include:   Car crashes.  Falls.  Family fighting (domestic violence).  Drowning.  Fights.  Injuries.  Burns.  Damage to certain  organs.  Having a baby with birth defects. ONE DRINK CAN BE TOO MUCH WHEN YOU ARE:  Working.  Pregnant or breastfeeding.  Taking medicines. Ask your doctor.  Driving or planning to drive. WHAT IS A STANDARD DRINK?   1 regular beer (12 ounces or 360 milliliters).  1 glass of wine (5 ounces or 150 milliliters).  1 shot of liquor (1.5 ounces or 45 milliliters). BLOOD ALCOHOL LEVELS   .00 A person is sober.  Marland Kitchen03 A person has no trouble keeping balance, talking, or seeing right, but a "buzz" may be felt.  Marland Kitchen05 A person feels "buzzed" and relaxed.  Marland Kitchen08 or .10  A person is drunk. He or she has trouble talking, seeing right, and keeping his or her balance.  .15 A person loses body control and may pass out (blackout).  .20 A person has trouble walking (staggering) and throws up (vomits).  .30 A person will pass out (unconscious).  .40+ A person will be in a coma. Death is possible. If you or someone you know has a drinking problem, get help from a doctor.  Document Released: 01/10/2009 Document Revised: 06/08/2011 Document Reviewed: 01/10/2009 [facility] Patient Information 2015 [place], [place]. This information is not intended to replace advice given to you by your health care provider. Make sure you discuss any questions you have with your health care provider.

## 2014-09-18 NOTE — ED Notes (Signed)
Pt sitting on side of bed in no distress, given water to drink.

## 2014-09-18 NOTE — ED Notes (Signed)
Patient transported to X-ray 

## 2014-09-18 NOTE — ED Provider Notes (Signed)
CSN: [id number]     Arrival date & time 09/18/14  0428 History   First MD Initiated Contact with Patient 09/18/14 [phone]     Chief Complaint  Patient presents with  . Fall     (Consider location/radiation/quality/duration/timing/severity/associated sxs/prior Treatment) HPI Comments: [name] Oneal is a 69 y.o. male with a PMHx of alcoholism and HTN, who presents to the ED with complaints of left-sided lower back pain after a mechanical fall when he tripped over his dog and fell onto 2-3 concrete steps 2 weeks ago. He reports the pain is 7/10 left-sided low back constant sharp nonradiating pain worse with walking and bending with no medications tried prior to arrival. He states that typically his pain is worse in the morning and eats up by the afternoon but then returns in the evening. He has an appointment with Dr. Fabienne Bruns free on 10/04/14. He denies any head injury or loss of consciousness, fevers, chills, chest pain, shortness of breath, abdominal pain, nausea, vomiting, dysuria, hematuria, cauda equina symptoms, incontinence of urine or stool, numbness, tingling, or weakness. Denies any bruises or abrasions.  Patient is a 69 y.o. male presenting with fall. The history is provided by the patient. No language interpreter was used.  Fall This is a new problem. The current episode started 1 to 4 weeks ago. The problem occurs rarely. The problem has been unchanged. Pertinent negatives include no abdominal pain, arthralgias, chest pain, chills, fever, headaches, myalgias, nausea, numbness, urinary symptoms, visual change, vomiting or weakness. The symptoms are aggravated by walking and bending. He has tried nothing for the symptoms. The treatment provided no relief.    Past Medical History  Diagnosis Date  . Alcoholic   . Hypertension    History reviewed. No pertinent past surgical history. History reviewed. No pertinent family history. History  Substance Use Topics  . Smoking status: Current  Every Day Smoker  . Smokeless tobacco: Not on file  . Alcohol Use: Yes    Review of Systems  Constitutional: Negative for fever and chills.  HENT: Negative for facial swelling (no head injury).   Eyes: Negative for visual disturbance.  Respiratory: Negative for shortness of breath.   Cardiovascular: Negative for chest pain.  Gastrointestinal: Negative for nausea, vomiting and abdominal pain.  Genitourinary: Negative for dysuria, hematuria and flank pain.  Musculoskeletal: Positive for back pain (L lumbar). Negative for myalgias and arthralgias.  Skin: Negative for color change and wound.  Allergic/Immunologic: Negative for immunocompromised state.  Neurological: Negative for syncope, weakness, numbness and headaches.  Psychiatric/Behavioral: Negative for confusion.   10 Systems reviewed and are negative for acute change except as noted in the HPI.    Allergies  Review of patient's allergies indicates no known allergies.  Home Medications   Prior to Admission medications   Not on File   BP 152/83 mmHg  Pulse 95  Temp(Src) 97.8 F (36.6 C) (Oral)  Resp 20  SpO2 96% Physical Exam  Constitutional: He is oriented to person, place, and time. Vital signs are normal. He appears well-developed and well-nourished.  Non-toxic appearance. No distress.  Afebrile, nontoxic, NAD  HENT:  Head: Normocephalic and atraumatic.  Mouth/Throat: Mucous membranes are normal.  Eyes: Conjunctivae and EOM are normal. Right eye exhibits no discharge. Left eye exhibits no discharge.  Neck: Normal range of motion. Neck supple.  Cardiovascular: Normal rate and intact distal pulses.   Pulmonary/Chest: Effort normal and breath sounds normal. No respiratory distress. He has no decreased breath sounds. He has  no wheezes. He has no rhonchi. He has no rales.  Abdominal: Normal appearance. He exhibits no distension.  Musculoskeletal: Normal range of motion.       Lumbar back: He exhibits tenderness and  spasm. He exhibits normal range of motion, no bony tenderness and no deformity.       Back:  Lumbar spine with FROM intact without spinous process TTP, no bony stepoffs or deformities, with L sided paraspinous muscle TTP near the last rib, palpable muscle spasms. Strength 5/5 in all extremities, sensation grossly intact in all extremities, negative SLR bilaterally, gait steady and nonantalgic. No overlying skin changes. Distal pulses intact  Neurological: He is alert and oriented to person, place, and time. He has normal strength. No sensory deficit.  Skin: Skin is warm, dry and intact. No rash noted.  No bruising or abrasions  Psychiatric: He has a normal mood and affect.  Nursing note and vitals reviewed.   ED Course  Procedures (including critical care time) Labs Review Labs Reviewed - No data to display  Imaging Review Dg Ribs Unilateral W/chest Left  09/18/2014   CLINICAL DATA:  Tripped over dog and fell down steps 2 weeks ago. Persistent LEFT posterolateral rib pain, worse with coughing.  EXAM: LEFT RIBS AND CHEST - 3+ VIEW  COMPARISON:  Chest radiograph June 27, 2008  FINDINGS: The cardiac silhouette is mildly enlarged, similar. Mediastinal silhouette is nonsuspicious. Mild pulmonary vascular congestion without pleural effusion or focal consolidation. Suspected RIGHT calcified pleural plaque midlung zone. No pneumothorax.  No rib fracture deformity.  IMPRESSION: Stable cardiomegaly and mild pulmonary vascular congestion. No rib fracture deformity.   Electronically Signed   By: Elon Alas M.D.   On: 09/18/2014 05:59     EKG Interpretation None      MDM   Final diagnoses:  Left-sided low back pain without sciatica  HTN (hypertension), benign  History of alcohol abuse  Contusion  Muscle spasm of back    69 y.o. male here with L lower back/flank pain after a mechanical fall. No red flag s/s of low back pain. No s/s of central cord compression or cauda equina. Lower  extremities are neurovascularly intact and patient is ambulating without difficulty. Pain to paraspinous muscles of L side, at the level of the last ribs. No midline tenderness. Xray of L ribs negative. Patient was counseled on back pain precautions and told to do activity as tolerated but do not lift, push, or pull heavy objects more than 10 pounds for the next week. Patient counseled to use ice or heat on back for no longer than 15 minutes every hour. Pt is an alcoholic therefore will avoid narcotics or muscle relaxants. Will start on mobic low-dose until he sees Dr. Gladstone Lighter on 7/7. Discussed use of ice/heat. Pt urged to return with worsening severe pain, loss of bowel or bladder control, trouble walking. The patient verbalizes understanding and agrees with the plan.   BP 139/103 mmHg  Pulse 91  Temp(Src) 97.8 F (36.6 C) (Oral)  Resp 18  SpO2 93%  Meds ordered this encounter  Medications  . meloxicam (MOBIC) tablet 7.5 mg    Sig:   . meloxicam (MOBIC) 7.5 MG tablet    Sig: Take 1 tablet (7.5 mg total) by mouth daily.    Dispense:  30 tablet    Refill:  0    Order Specific Question:  Supervising Provider    Answer:  Sabra Heck, BRIAN [[id number]]       Howie Rufus Camprubi-Soms,  PA-C 09/18/14 0701  Shanon Rosser, MD 09/18/14 3414

## 2014-09-18 NOTE — ED Notes (Signed)
Pt fell about two weeks ago, he still complains of left flank pain especially when coughing and taking deep breaths, no obvious bruising noted

## 2014-09-18 NOTE — ED Notes (Signed)
Pt states about 2 1/2 weeks ago tripped over his dog and fell down about 2-3 concrete stairs on L side, states has a hx of a "bad back", states still having L flank/back pain, states feels like something sharp when walking. Has not been seen for this when it happen.

## 2015-05-09 DIAGNOSIS — E559 Vitamin D deficiency, unspecified: Secondary | ICD-10-CM | POA: Diagnosis not present

## 2015-05-09 DIAGNOSIS — M25511 Pain in right shoulder: Secondary | ICD-10-CM | POA: Diagnosis not present

## 2015-05-09 DIAGNOSIS — M25512 Pain in left shoulder: Secondary | ICD-10-CM | POA: Diagnosis not present

## 2015-05-09 DIAGNOSIS — E7800 Pure hypercholesterolemia, unspecified: Secondary | ICD-10-CM | POA: Diagnosis not present

## 2015-05-09 DIAGNOSIS — I10 Essential (primary) hypertension: Secondary | ICD-10-CM | POA: Diagnosis not present

## 2015-05-09 DIAGNOSIS — G8929 Other chronic pain: Secondary | ICD-10-CM | POA: Diagnosis not present

## 2015-07-22 ENCOUNTER — Emergency Department (HOSPITAL_COMMUNITY)
Admission: EM | Admit: 2015-07-22 | Discharge: 2015-07-22 | Discharge status: Absent without leave | Payer: PRIVATE HEALTH INSURANCE

## 2015-07-23 DIAGNOSIS — E559 Vitamin D deficiency, unspecified: Secondary | ICD-10-CM | POA: Diagnosis not present

## 2015-07-23 DIAGNOSIS — M25511 Pain in right shoulder: Secondary | ICD-10-CM | POA: Diagnosis not present

## 2015-07-23 DIAGNOSIS — G8929 Other chronic pain: Secondary | ICD-10-CM | POA: Diagnosis not present

## 2015-07-23 DIAGNOSIS — Z720 Tobacco use: Secondary | ICD-10-CM | POA: Diagnosis not present

## 2015-07-23 DIAGNOSIS — F17200 Nicotine dependence, unspecified, uncomplicated: Secondary | ICD-10-CM | POA: Diagnosis not present

## 2015-07-23 DIAGNOSIS — R0781 Pleurodynia: Secondary | ICD-10-CM | POA: Diagnosis not present

## 2015-07-23 DIAGNOSIS — M25512 Pain in left shoulder: Secondary | ICD-10-CM | POA: Diagnosis not present

## 2015-08-06 DIAGNOSIS — M25512 Pain in left shoulder: Secondary | ICD-10-CM | POA: Diagnosis not present

## 2015-08-06 DIAGNOSIS — I10 Essential (primary) hypertension: Secondary | ICD-10-CM | POA: Diagnosis not present

## 2015-08-06 DIAGNOSIS — E559 Vitamin D deficiency, unspecified: Secondary | ICD-10-CM | POA: Diagnosis not present

## 2015-08-06 DIAGNOSIS — Z79899 Other long term (current) drug therapy: Secondary | ICD-10-CM | POA: Diagnosis not present

## 2015-08-06 DIAGNOSIS — M25511 Pain in right shoulder: Secondary | ICD-10-CM | POA: Diagnosis not present

## 2015-08-06 DIAGNOSIS — F17200 Nicotine dependence, unspecified, uncomplicated: Secondary | ICD-10-CM | POA: Diagnosis not present

## 2015-08-06 DIAGNOSIS — E7800 Pure hypercholesterolemia, unspecified: Secondary | ICD-10-CM | POA: Diagnosis not present

## 2015-08-06 DIAGNOSIS — Z720 Tobacco use: Secondary | ICD-10-CM | POA: Diagnosis not present

## 2015-09-25 DIAGNOSIS — J069 Acute upper respiratory infection, unspecified: Secondary | ICD-10-CM | POA: Diagnosis not present

## 2015-09-25 DIAGNOSIS — R0602 Shortness of breath: Secondary | ICD-10-CM | POA: Diagnosis not present

## 2015-12-12 DIAGNOSIS — L821 Other seborrheic keratosis: Secondary | ICD-10-CM | POA: Diagnosis not present

## 2015-12-12 DIAGNOSIS — D485 Neoplasm of uncertain behavior of skin: Secondary | ICD-10-CM | POA: Diagnosis not present

## 2015-12-12 DIAGNOSIS — L570 Actinic keratosis: Secondary | ICD-10-CM | POA: Diagnosis not present

## 2016-01-02 DIAGNOSIS — D045 Carcinoma in situ of skin of trunk: Secondary | ICD-10-CM | POA: Diagnosis not present

## 2016-01-23 ENCOUNTER — Emergency Department (HOSPITAL_COMMUNITY)
Admission: EM | Admit: 2016-01-23 | Discharge: 2016-01-23 | Disposition: A | Discharge status: Home / Self Care | Payer: PPO | Attending: Emergency Medicine | Admitting: Emergency Medicine

## 2016-01-23 ENCOUNTER — Encounter (HOSPITAL_COMMUNITY): Payer: Self-pay | Admitting: *Deleted

## 2016-01-23 DIAGNOSIS — Y999 Unspecified external cause status: Secondary | ICD-10-CM | POA: Insufficient documentation

## 2016-01-23 DIAGNOSIS — F17200 Nicotine dependence, unspecified, uncomplicated: Secondary | ICD-10-CM | POA: Insufficient documentation

## 2016-01-23 DIAGNOSIS — X501XXA Overexertion from prolonged static or awkward postures, initial encounter: Secondary | ICD-10-CM | POA: Diagnosis not present

## 2016-01-23 DIAGNOSIS — Y929 Unspecified place or not applicable: Secondary | ICD-10-CM | POA: Insufficient documentation

## 2016-01-23 DIAGNOSIS — M62830 Muscle spasm of back: Secondary | ICD-10-CM | POA: Diagnosis not present

## 2016-01-23 DIAGNOSIS — M545 Low back pain: Secondary | ICD-10-CM | POA: Diagnosis present

## 2016-01-23 DIAGNOSIS — I10 Essential (primary) hypertension: Secondary | ICD-10-CM | POA: Insufficient documentation

## 2016-01-23 DIAGNOSIS — Y9389 Activity, other specified: Secondary | ICD-10-CM | POA: Insufficient documentation

## 2016-01-23 MED ORDER — CYCLOBENZAPRINE HCL 10 MG PO TABS: 10.0000 mg | ORAL_TABLET | Freq: Two times a day (BID) | ORAL | 0 refills | Status: DC | PRN

## 2016-01-23 MED ORDER — CYCLOBENZAPRINE HCL 10 MG PO TABS
10.0000 mg | ORAL_TABLET | Freq: Once | ORAL | Status: AC
  Administered 2016-01-23: 10 mg via ORAL
  Filled 2016-01-23: qty 1

## 2016-01-23 NOTE — ED Provider Notes (Signed)
[facility] DEPT Provider Note   CSN: [id number] Arrival date & time: 01/23/16  2111     History   Chief Complaint Chief Complaint  Patient presents with  . Back Pain    HPI [name] Oneal is a 70 y.o. male.  Patient presents to the emergency department with chief complaint of muscle spasm. He states that he has had low back pain for the past 3-4 days. He states that he twisted his back or pick up the newspaper. He denies any fevers, chills, nausea, or vomiting. There are no other associated symptoms. He denies any numbness, weakness, or tingling. He has not tried taking anything to alleviate his symptoms.   The history is provided by the patient. No language interpreter was used.    Past Medical History:  Diagnosis Date  . Alcoholic ([place])   . Hypertension     Patient Active Problem List   Diagnosis Date Noted  . HYPERTENSION, UNSPECIFIED 07/19/2008  . BRONCHITIS, CHRONIC 07/19/2008    History reviewed. No pertinent surgical history.     Home Medications    Prior to Admission medications   Medication Sig Start Date End Date Taking? Authorizing Provider  cyclobenzaprine (FLEXERIL) 10 MG tablet Take 1 tablet (10 mg total) by mouth 2 (two) times daily as needed for muscle spasms. 01/23/16   Montine Circle, PA-C  meloxicam (MOBIC) 7.5 MG tablet Take 1 tablet (7.5 mg total) by mouth daily. 09/18/14   Mercedes Camprubi-Soms, PA-C    Family History History reviewed. No pertinent family history.  Social History Social History  Substance Use Topics  . Smoking status: Current Every Day Smoker  . Smokeless tobacco: Never Used  . Alcohol use Yes     Allergies   Review of patient's allergies indicates no known allergies.   Review of Systems Review of Systems  Constitutional: Negative for chills and fever.  Gastrointestinal:       No bowel incontinence  Genitourinary:       No urinary incontinence  Musculoskeletal: Positive for arthralgias, back  pain and myalgias.  Neurological:       No saddle anesthesia     Physical Exam Updated Vital Signs BP 167/89 (BP Location: Left Arm)   Pulse 81   Temp 97.6 F (36.4 C) (Oral)   Resp 16   Ht 5\' 10"  (1.778 m)   Wt 105.8 kg   SpO2 95%   BMI 33.47 kg/m   Physical Exam Physical Exam  Constitutional: Pt appears well-developed and well-nourished. No distress.  HENT:  Head: Normocephalic and atraumatic.  Mouth/Throat: Oropharynx is clear and moist. No oropharyngeal exudate.  Eyes: Conjunctivae are normal.  Neck: Normal range of motion. Neck supple.  No meningismus Cardiovascular: Normal rate, regular rhythm and intact distal pulses.   Pulmonary/Chest: Effort normal and breath sounds normal. No respiratory distress. Pt has no wheezes.  Abdominal: Pt exhibits no distension Musculoskeletal:  Left latissimus dorsi tender to palpation, no bony CTLS spine tenderness, deformity, step-off, or crepitus Lymphadenopathy: Pt has no cervical adenopathy.  Neurological: Pt is alert and oriented Speech is clear and goal oriented, follows commands Normal 5/5 strength in upper and lower extremities bilaterally including dorsiflexion and plantar flexion, strong and equal grip strength Sensation intact Great toe extension intact Moves extremities without ataxia, coordination intact Normal gait Normal balance No Clonus Skin: Skin is warm and dry. No rash noted. Pt is not diaphoretic. No erythema.  Psychiatric: Pt has a normal mood and affect. Behavior is normal.  Nursing  note and vitals reviewed.   ED Treatments / Results  Labs (all labs ordered are listed, but only abnormal results are displayed) Labs Reviewed - No data to display  EKG  EKG Interpretation None       Radiology No results found.  Procedures Procedures (including critical care time)  Medications Ordered in ED Medications  cyclobenzaprine (FLEXERIL) tablet 10 mg (not administered)     Initial Impression /  Assessment and Plan / ED Course  I have reviewed the triage vital signs and the nursing notes.  Pertinent labs & imaging results that were available during my care of the patient were reviewed by me and considered in my medical decision making (see chart for details).  Clinical Course    Patient with back pain.  No neurological deficits and normal neuro exam.  Patient is ambulatory.  No loss of bowel or bladder control.  Doubt cauda equina.  Denies fever,  doubt epidural abscess or other lesion. Recommend back exercises, stretching, RICE, and will treat with a short course of flexeril. Encouraged the patient that there could be a need for additional workup and/or imaging such as MRI, if the symptoms do not resolve. Patient advised that if the back pain does not resolve, or radiates, this could progress to more serious conditions and is encouraged to follow-up with PCP or orthopedics within 2 weeks.     Final Clinical Impressions(s) / ED Diagnoses   Final diagnoses:  Muscle spasm of back    New Prescriptions New Prescriptions   CYCLOBENZAPRINE (FLEXERIL) 10 MG TABLET    Take 1 tablet (10 mg total) by mouth 2 (two) times daily as needed for muscle spasms.     Montine Circle, PA-C 01/23/16 2149    Virgel Manifold, MD 01/26/16 [phone]

## 2016-01-23 NOTE — ED Notes (Signed)
Declined W/C at D/C and was escorted to lobby by RN. 

## 2016-01-23 NOTE — ED Notes (Signed)
Pt appears intoxicated.  States that he had beer PTA (10 pack)

## 2016-01-23 NOTE — ED Triage Notes (Signed)
Pt here for worsening lower back pain in left hip and back.  Not associated with any recent injury.  No weakness or numbness with this.

## 2016-03-06 DIAGNOSIS — M25512 Pain in left shoulder: Secondary | ICD-10-CM | POA: Diagnosis not present

## 2016-03-06 DIAGNOSIS — H10023 Other mucopurulent conjunctivitis, bilateral: Secondary | ICD-10-CM | POA: Diagnosis not present

## 2016-03-06 DIAGNOSIS — E7800 Pure hypercholesterolemia, unspecified: Secondary | ICD-10-CM | POA: Diagnosis not present

## 2016-03-06 DIAGNOSIS — I10 Essential (primary) hypertension: Secondary | ICD-10-CM | POA: Diagnosis not present

## 2016-03-06 DIAGNOSIS — Z716 Tobacco abuse counseling: Secondary | ICD-10-CM | POA: Diagnosis not present

## 2016-03-06 DIAGNOSIS — Z0000 Encounter for general adult medical examination without abnormal findings: Secondary | ICD-10-CM | POA: Diagnosis not present

## 2016-03-06 DIAGNOSIS — Z1389 Encounter for screening for other disorder: Secondary | ICD-10-CM | POA: Diagnosis not present

## 2016-03-06 DIAGNOSIS — M25511 Pain in right shoulder: Secondary | ICD-10-CM | POA: Diagnosis not present

## 2016-03-06 DIAGNOSIS — G8929 Other chronic pain: Secondary | ICD-10-CM | POA: Diagnosis not present

## 2016-03-06 DIAGNOSIS — Z79899 Other long term (current) drug therapy: Secondary | ICD-10-CM | POA: Diagnosis not present

## 2016-03-06 DIAGNOSIS — E559 Vitamin D deficiency, unspecified: Secondary | ICD-10-CM | POA: Diagnosis not present

## 2016-05-27 DIAGNOSIS — M25511 Pain in right shoulder: Secondary | ICD-10-CM | POA: Diagnosis not present

## 2016-05-27 DIAGNOSIS — M25512 Pain in left shoulder: Secondary | ICD-10-CM | POA: Diagnosis not present

## 2016-05-27 DIAGNOSIS — Z79899 Other long term (current) drug therapy: Secondary | ICD-10-CM | POA: Diagnosis not present

## 2016-05-27 DIAGNOSIS — I10 Essential (primary) hypertension: Secondary | ICD-10-CM | POA: Diagnosis not present

## 2016-05-27 DIAGNOSIS — G8929 Other chronic pain: Secondary | ICD-10-CM | POA: Diagnosis not present

## 2016-09-03 DIAGNOSIS — I10 Essential (primary) hypertension: Secondary | ICD-10-CM | POA: Diagnosis not present

## 2016-09-03 DIAGNOSIS — E559 Vitamin D deficiency, unspecified: Secondary | ICD-10-CM | POA: Diagnosis not present

## 2016-09-03 DIAGNOSIS — Z87891 Personal history of nicotine dependence: Secondary | ICD-10-CM | POA: Diagnosis not present

## 2016-09-03 DIAGNOSIS — M25512 Pain in left shoulder: Secondary | ICD-10-CM | POA: Diagnosis not present

## 2016-09-03 DIAGNOSIS — Z79899 Other long term (current) drug therapy: Secondary | ICD-10-CM | POA: Diagnosis not present

## 2016-09-03 DIAGNOSIS — G8929 Other chronic pain: Secondary | ICD-10-CM | POA: Diagnosis not present

## 2016-09-03 DIAGNOSIS — F17200 Nicotine dependence, unspecified, uncomplicated: Secondary | ICD-10-CM | POA: Diagnosis not present

## 2016-09-03 DIAGNOSIS — E7800 Pure hypercholesterolemia, unspecified: Secondary | ICD-10-CM | POA: Diagnosis not present

## 2016-09-03 DIAGNOSIS — M25511 Pain in right shoulder: Secondary | ICD-10-CM | POA: Diagnosis not present

## 2016-09-03 DIAGNOSIS — Z1389 Encounter for screening for other disorder: Secondary | ICD-10-CM | POA: Diagnosis not present

## 2016-12-01 DIAGNOSIS — M25512 Pain in left shoulder: Secondary | ICD-10-CM | POA: Diagnosis not present

## 2016-12-01 DIAGNOSIS — G8929 Other chronic pain: Secondary | ICD-10-CM | POA: Diagnosis not present

## 2016-12-01 DIAGNOSIS — E7800 Pure hypercholesterolemia, unspecified: Secondary | ICD-10-CM | POA: Diagnosis not present

## 2016-12-01 DIAGNOSIS — Z79899 Other long term (current) drug therapy: Secondary | ICD-10-CM | POA: Diagnosis not present

## 2016-12-01 DIAGNOSIS — M25511 Pain in right shoulder: Secondary | ICD-10-CM | POA: Diagnosis not present

## 2017-01-22 DIAGNOSIS — Z87891 Personal history of nicotine dependence: Secondary | ICD-10-CM | POA: Diagnosis not present

## 2017-01-22 DIAGNOSIS — F17200 Nicotine dependence, unspecified, uncomplicated: Secondary | ICD-10-CM | POA: Diagnosis not present

## 2017-01-22 DIAGNOSIS — M25512 Pain in left shoulder: Secondary | ICD-10-CM | POA: Diagnosis not present

## 2017-01-22 DIAGNOSIS — M25511 Pain in right shoulder: Secondary | ICD-10-CM | POA: Diagnosis not present

## 2017-01-22 DIAGNOSIS — J209 Acute bronchitis, unspecified: Secondary | ICD-10-CM | POA: Diagnosis not present

## 2017-01-22 DIAGNOSIS — G8929 Other chronic pain: Secondary | ICD-10-CM | POA: Diagnosis not present

## 2017-01-22 DIAGNOSIS — I10 Essential (primary) hypertension: Secondary | ICD-10-CM | POA: Diagnosis not present

## 2017-01-22 DIAGNOSIS — Z79899 Other long term (current) drug therapy: Secondary | ICD-10-CM | POA: Diagnosis not present

## 2017-09-16 ENCOUNTER — Encounter (HOSPITAL_COMMUNITY): Payer: Self-pay

## 2017-09-16 ENCOUNTER — Other Ambulatory Visit: Payer: Self-pay

## 2017-09-16 ENCOUNTER — Emergency Department (HOSPITAL_COMMUNITY)
Admission: EM | Admit: 2017-09-16 | Discharge: 2017-09-17 | Disposition: A | Discharge status: Home / Self Care | Payer: Non-veteran care | Attending: Emergency Medicine | Admitting: Emergency Medicine

## 2017-09-16 DIAGNOSIS — Y908 Blood alcohol level of 240 mg/100 ml or more: Secondary | ICD-10-CM | POA: Diagnosis not present

## 2017-09-16 DIAGNOSIS — I10 Essential (primary) hypertension: Secondary | ICD-10-CM | POA: Diagnosis not present

## 2017-09-16 DIAGNOSIS — F1014 Alcohol abuse with alcohol-induced mood disorder: Secondary | ICD-10-CM | POA: Insufficient documentation

## 2017-09-16 DIAGNOSIS — R451 Restlessness and agitation: Secondary | ICD-10-CM | POA: Insufficient documentation

## 2017-09-16 DIAGNOSIS — Z046 Encounter for general psychiatric examination, requested by authority: Secondary | ICD-10-CM

## 2017-09-16 DIAGNOSIS — Z79899 Other long term (current) drug therapy: Secondary | ICD-10-CM | POA: Insufficient documentation

## 2017-09-16 DIAGNOSIS — F17210 Nicotine dependence, cigarettes, uncomplicated: Secondary | ICD-10-CM | POA: Diagnosis not present

## 2017-09-16 LAB — COMPREHENSIVE METABOLIC PANEL
ALT: 67 U/L — ABNORMAL HIGH (ref 17–63)
AST: 63 U/L — AB (ref 15–41)
Albumin: 4.3 g/dL (ref 3.5–5.0)
Alkaline Phosphatase: 58 U/L (ref 38–126)
Anion gap: 14 (ref 5–15)
BUN: 24 mg/dL — ABNORMAL HIGH (ref 6–20)
CHLORIDE: 104 mmol/L (ref 101–111)
CO2: 26 mmol/L (ref 22–32)
Calcium: 9.5 mg/dL (ref 8.9–10.3)
Creatinine, Ser: 1.08 mg/dL (ref 0.61–1.24)
Glucose, Bld: 106 mg/dL — ABNORMAL HIGH (ref 65–99)
POTASSIUM: 3.8 mmol/L (ref 3.5–5.1)
SODIUM: 144 mmol/L (ref 135–145)
Total Bilirubin: 0.6 mg/dL (ref 0.3–1.2)
Total Protein: 7.6 g/dL (ref 6.5–8.1)

## 2017-09-16 LAB — SALICYLATE LEVEL

## 2017-09-16 LAB — CBC
HCT: 46.1 % (ref 39.0–52.0)
HEMOGLOBIN: 16 g/dL (ref 13.0–17.0)
MCH: 35 pg — ABNORMAL HIGH (ref 26.0–34.0)
MCHC: 34.7 g/dL (ref 30.0–36.0)
MCV: 100.9 fL — ABNORMAL HIGH (ref 78.0–100.0)
PLATELETS: 279 10*3/uL (ref 150–400)
RBC: 4.57 MIL/uL (ref 4.22–5.81)
RDW: 14.3 % (ref 11.5–15.5)
WBC: 8.2 10*3/uL (ref 4.0–10.5)

## 2017-09-16 LAB — ACETAMINOPHEN LEVEL: Acetaminophen (Tylenol), Serum: <10 ug/mL — ABNORMAL LOW (ref 10–30)

## 2017-09-16 LAB — ETHANOL: ALCOHOL ETHYL (B): 267 mg/dL — AB (ref <10)

## 2017-09-16 MED ORDER — THIAMINE HCL 100 MG/ML IJ SOLN: 100.0000 mg | Freq: Every day | INTRAMUSCULAR | Status: DC

## 2017-09-16 MED ORDER — VITAMIN B-1 100 MG PO TABS
100.0000 mg | ORAL_TABLET | Freq: Every day | ORAL | Status: DC
  Administered 2017-09-17: 100 mg via ORAL
  Filled 2017-09-16: qty 1

## 2017-09-16 MED ORDER — NICOTINE 21 MG/24HR TD PT24
21.0000 mg | MEDICATED_PATCH | Freq: Every day | TRANSDERMAL | Status: DC
  Filled 2017-09-16: qty 1

## 2017-09-16 MED ORDER — ONDANSETRON HCL 4 MG PO TABS: 4.0000 mg | ORAL_TABLET | Freq: Three times a day (TID) | ORAL | Status: DC | PRN

## 2017-09-16 MED ORDER — ZOLPIDEM TARTRATE 5 MG PO TABS
5.0000 mg | ORAL_TABLET | Freq: Every evening | ORAL | Status: DC | PRN
  Administered 2017-09-16: 5 mg via ORAL
  Filled 2017-09-16: qty 1

## 2017-09-16 MED ORDER — LORAZEPAM 2 MG/ML IJ SOLN: 0.0000 mg | Freq: Two times a day (BID) | INTRAMUSCULAR | Status: DC

## 2017-09-16 MED ORDER — LORAZEPAM 1 MG PO TABS: 0.0000 mg | ORAL_TABLET | Freq: Two times a day (BID) | ORAL | Status: DC

## 2017-09-16 MED ORDER — ALUM & MAG HYDROXIDE-SIMETH 200-200-20 MG/5ML PO SUSP: 30.0000 mL | Freq: Four times a day (QID) | ORAL | Status: DC | PRN

## 2017-09-16 MED ORDER — LORAZEPAM 2 MG/ML IJ SOLN
2.0000 mg | Freq: Once | INTRAMUSCULAR | Status: AC
Start: 2017-09-16 — End: 2017-09-16
  Administered 2017-09-16: 2 mg via INTRAMUSCULAR
  Filled 2017-09-16: qty 1

## 2017-09-16 MED ORDER — LORAZEPAM 1 MG PO TABS
0.0000 mg | ORAL_TABLET | Freq: Four times a day (QID) | ORAL | Status: DC
  Administered 2017-09-17 (×2): 1 mg via ORAL
  Filled 2017-09-16 (×2): qty 1

## 2017-09-16 MED ORDER — LORAZEPAM 2 MG/ML IJ SOLN: 0.0000 mg | Freq: Four times a day (QID) | INTRAMUSCULAR | Status: DC

## 2017-09-16 MED ORDER — ACETAMINOPHEN 325 MG PO TABS: 650.0000 mg | ORAL_TABLET | ORAL | Status: DC | PRN

## 2017-09-16 NOTE — ED Notes (Signed)
Bed: WA31 Expected date:  Expected time:  Means of arrival:  Comments: 

## 2017-09-16 NOTE — ED Notes (Signed)
Pt being eval by TTS Lytle Michaels at present.
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
## 2017-09-16 NOTE — BHH Counselor (Signed)
Clinician called IVC petitioner Linus Orn Candie Echevaria, niece [phone]) to gather additional information however the call was sent to voicemail. Clinician left a HIPPA compliant voice message.   Vertell Novak, MS, [facility], [facility] Triage Specialist [phone]

## 2017-09-17 DIAGNOSIS — F1014 Alcohol abuse with alcohol-induced mood disorder: Secondary | ICD-10-CM

## 2017-09-17 LAB — RAPID URINE DRUG SCREEN, HOSP PERFORMED
Amphetamines: NOT DETECTED
BENZODIAZEPINES: NOT DETECTED
COCAINE: NOT DETECTED
Opiates: NOT DETECTED
Tetrahydrocannabinol: NOT DETECTED

## 2017-09-17 LAB — URINALYSIS, ROUTINE W REFLEX MICROSCOPIC
Bilirubin Urine: NEGATIVE
Glucose, UA: NEGATIVE mg/dL
Hgb urine dipstick: NEGATIVE
Ketones, ur: NEGATIVE mg/dL
Leukocytes, UA: NEGATIVE
Nitrite: NEGATIVE
Protein, ur: NEGATIVE mg/dL
Specific Gravity, Urine: 1.014 (ref 1.005–1.030)
pH: 5 (ref 5.0–8.0)

## 2017-09-17 MED ORDER — LISINOPRIL 20 MG PO TABS
20.0000 mg | ORAL_TABLET | Freq: Every day | ORAL | Status: DC
  Administered 2017-09-17: 20 mg via ORAL
  Filled 2017-09-17: qty 1

## 2017-09-17 MED ORDER — HYDROCHLOROTHIAZIDE 25 MG PO TABS
25.0000 mg | ORAL_TABLET | Freq: Every day | ORAL | Status: DC
  Administered 2017-09-17: 25 mg via ORAL
  Filled 2017-09-17: qty 1

## 2017-09-17 MED ORDER — GABAPENTIN 300 MG PO CAPS
300.0000 mg | ORAL_CAPSULE | Freq: Two times a day (BID) | ORAL | Status: DC
  Administered 2017-09-17: 300 mg via ORAL
  Filled 2017-09-17: qty 1

## 2017-09-17 MED ORDER — GABAPENTIN 300 MG PO CAPS: 300.0000 mg | ORAL_CAPSULE | Freq: Two times a day (BID) | ORAL | Status: DC

## 2017-09-17 MED ORDER — LISINOPRIL-HYDROCHLOROTHIAZIDE 20-25 MG PO TABS: 1.0000 | ORAL_TABLET | Freq: Every day | ORAL | Status: DC

## 2017-09-17 MED ORDER — GABAPENTIN 300 MG PO CAPS: 300.0000 mg | ORAL_CAPSULE | Freq: Two times a day (BID) | ORAL | 0 refills | Status: DC

## 2017-09-17 MED ORDER — METOPROLOL TARTRATE 25 MG PO TABS
100.0000 mg | ORAL_TABLET | Freq: Two times a day (BID) | ORAL | Status: DC
  Administered 2017-09-17: 100 mg via ORAL
  Filled 2017-09-17: qty 4

## 2017-09-17 NOTE — Discharge Instructions (Signed)
For your behavioral health needs, you are advised to follow up with one of the following providers at your earliest opportunity:       [facility]      [place], [place] [address]      [phone]       [facility]      [place], [place] [address]      [phone]  For supportive services for veterans in the [place] area, contact the [place]:       [facility]      [address], [place], [place] [address]      [phone]      Hours of operation: 8:00 am - 7:00 pm, Monday - Friday  For crisis services for veterans, call the [employer], available 24 hours a day, 7 days a week:       [facility] [employer]      [phone]

## 2017-09-17 NOTE — ED Notes (Signed)
Pt discharged home. Discharged instructions read to pt who verbalized understanding. All belongings returned to pt who signed for same. Denies SI/HI, is not delusional and not responding to internal stimuli. Escorted pt to the ED exit.   Concerning high BP pt said, "It is always like that; just let me go please." Pt insisted on leaving. All BP medications in addition to an Ativan 1 mg and Neurontin given to pt. Pt was calm and cooperative.

## 2017-09-17 NOTE — BH Assessment (Signed)
[facility] Assessment Progress Note  Per Corena Pilgrim, MD, this pt does not require psychiatric hospitalization at this time.  Pt presents under IVC initiated by pt's niece, which Dr Darleene Cleaver has rescinded.  Pt is to be discharged from [facility] with referral information for the [facility], and for area [place] service providers.  This has been included in pt's discharge instructions.  Pt's nurse, Diane, has been notified.  Jalene Mullet, [facility] Triage Specialist [phone]

## 2017-09-17 NOTE — Patient Outreach (Signed)
CPSS met with the patient to provide substance use recovery support.  Patient states that he has been to [place] meetings in the past and has a family member who is really involved in [place]. CPSS provided an AA meeting list for the [place] area. CPSS provided CPSS contact information. CPSS strongly encouraged the patient to contact CPSS for further recovery support and help with recovery resources.
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
## 2018-02-08 DIAGNOSIS — M25511 Pain in right shoulder: Secondary | ICD-10-CM | POA: Diagnosis not present

## 2018-02-08 DIAGNOSIS — M542 Cervicalgia: Secondary | ICD-10-CM | POA: Diagnosis not present

## 2019-09-04 DIAGNOSIS — M19071 Primary osteoarthritis, right ankle and foot: Secondary | ICD-10-CM | POA: Diagnosis not present

## 2019-09-04 DIAGNOSIS — M109 Gout, unspecified: Secondary | ICD-10-CM | POA: Diagnosis not present

## 2019-09-04 DIAGNOSIS — M67371 Transient synovitis, right ankle and foot: Secondary | ICD-10-CM | POA: Diagnosis not present

## 2019-09-06 DIAGNOSIS — M109 Gout, unspecified: Secondary | ICD-10-CM | POA: Diagnosis not present

## 2019-09-06 DIAGNOSIS — M7751 Other enthesopathy of right foot: Secondary | ICD-10-CM | POA: Diagnosis not present

## 2019-11-22 DIAGNOSIS — J1282 Pneumonia due to coronavirus disease 2019: Secondary | ICD-10-CM | POA: Diagnosis not present

## 2019-11-22 DIAGNOSIS — R918 Other nonspecific abnormal finding of lung field: Secondary | ICD-10-CM | POA: Diagnosis not present

## 2019-11-22 DIAGNOSIS — J189 Pneumonia, unspecified organism: Secondary | ICD-10-CM | POA: Diagnosis not present

## 2019-11-22 DIAGNOSIS — R05 Cough: Secondary | ICD-10-CM | POA: Diagnosis not present

## 2019-11-22 DIAGNOSIS — R5383 Other fatigue: Secondary | ICD-10-CM | POA: Diagnosis not present

## 2019-11-22 DIAGNOSIS — U071 COVID-19: Secondary | ICD-10-CM | POA: Diagnosis not present

## 2019-11-22 DIAGNOSIS — Z20822 Contact with and (suspected) exposure to covid-19: Secondary | ICD-10-CM | POA: Diagnosis not present

## 2019-11-22 DIAGNOSIS — R0602 Shortness of breath: Secondary | ICD-10-CM | POA: Diagnosis not present

## 2020-05-27 DIAGNOSIS — M109 Gout, unspecified: Secondary | ICD-10-CM | POA: Diagnosis not present

## 2020-05-27 DIAGNOSIS — M67371 Transient synovitis, right ankle and foot: Secondary | ICD-10-CM | POA: Diagnosis not present

## 2020-05-29 DIAGNOSIS — M109 Gout, unspecified: Secondary | ICD-10-CM | POA: Diagnosis not present

## 2020-05-29 DIAGNOSIS — M7751 Other enthesopathy of right foot: Secondary | ICD-10-CM | POA: Diagnosis not present

## 2022-06-20 ENCOUNTER — Emergency Department (HOSPITAL_COMMUNITY): Payer: No Typology Code available for payment source

## 2022-06-20 ENCOUNTER — Other Ambulatory Visit: Payer: Self-pay

## 2022-06-20 ENCOUNTER — Inpatient Hospital Stay (HOSPITAL_COMMUNITY)
Admission: EM | Admit: 2022-06-20 | Discharge: 2022-07-14 | DRG: 280 | Disposition: A | Discharge status: Skilled Nursing Facility | Payer: No Typology Code available for payment source | Attending: Internal Medicine | Admitting: Internal Medicine

## 2022-06-20 ENCOUNTER — Encounter (HOSPITAL_COMMUNITY): Payer: Self-pay

## 2022-06-20 DIAGNOSIS — Z1152 Encounter for screening for COVID-19: Secondary | ICD-10-CM

## 2022-06-20 DIAGNOSIS — J189 Pneumonia, unspecified organism: Secondary | ICD-10-CM

## 2022-06-20 DIAGNOSIS — I160 Hypertensive urgency: Secondary | ICD-10-CM | POA: Diagnosis present

## 2022-06-20 DIAGNOSIS — Z66 Do not resuscitate: Secondary | ICD-10-CM | POA: Diagnosis present

## 2022-06-20 DIAGNOSIS — D539 Nutritional anemia, unspecified: Secondary | ICD-10-CM | POA: Diagnosis present

## 2022-06-20 DIAGNOSIS — E7220 Disorder of urea cycle metabolism, unspecified: Secondary | ICD-10-CM | POA: Diagnosis present

## 2022-06-20 DIAGNOSIS — K5900 Constipation, unspecified: Secondary | ICD-10-CM | POA: Diagnosis not present

## 2022-06-20 DIAGNOSIS — I200 Unstable angina: Secondary | ICD-10-CM | POA: Diagnosis present

## 2022-06-20 DIAGNOSIS — N179 Acute kidney failure, unspecified: Secondary | ICD-10-CM | POA: Diagnosis not present

## 2022-06-20 DIAGNOSIS — I214 Non-ST elevation (NSTEMI) myocardial infarction: Secondary | ICD-10-CM

## 2022-06-20 DIAGNOSIS — I25110 Atherosclerotic heart disease of native coronary artery with unstable angina pectoris: Secondary | ICD-10-CM | POA: Diagnosis present

## 2022-06-20 DIAGNOSIS — L03116 Cellulitis of left lower limb: Secondary | ICD-10-CM | POA: Diagnosis present

## 2022-06-20 DIAGNOSIS — E669 Obesity, unspecified: Secondary | ICD-10-CM | POA: Diagnosis present

## 2022-06-20 DIAGNOSIS — R001 Bradycardia, unspecified: Secondary | ICD-10-CM | POA: Diagnosis not present

## 2022-06-20 DIAGNOSIS — I5032 Chronic diastolic (congestive) heart failure: Secondary | ICD-10-CM | POA: Diagnosis present

## 2022-06-20 DIAGNOSIS — G9341 Metabolic encephalopathy: Secondary | ICD-10-CM | POA: Diagnosis present

## 2022-06-20 DIAGNOSIS — E870 Hyperosmolality and hypernatremia: Secondary | ICD-10-CM | POA: Diagnosis present

## 2022-06-20 DIAGNOSIS — Z7409 Other reduced mobility: Secondary | ICD-10-CM | POA: Diagnosis not present

## 2022-06-20 DIAGNOSIS — L89620 Pressure ulcer of left heel, unstageable: Secondary | ICD-10-CM | POA: Diagnosis not present

## 2022-06-20 DIAGNOSIS — J441 Chronic obstructive pulmonary disease with (acute) exacerbation: Secondary | ICD-10-CM | POA: Diagnosis not present

## 2022-06-20 DIAGNOSIS — I959 Hypotension, unspecified: Secondary | ICD-10-CM | POA: Diagnosis not present

## 2022-06-20 DIAGNOSIS — R739 Hyperglycemia, unspecified: Secondary | ICD-10-CM | POA: Diagnosis not present

## 2022-06-20 DIAGNOSIS — Z781 Physical restraint status: Secondary | ICD-10-CM

## 2022-06-20 DIAGNOSIS — Z91199 Patient's noncompliance with other medical treatment and regimen due to unspecified reason: Secondary | ICD-10-CM

## 2022-06-20 DIAGNOSIS — E538 Deficiency of other specified B group vitamins: Secondary | ICD-10-CM | POA: Diagnosis present

## 2022-06-20 DIAGNOSIS — J9602 Acute respiratory failure with hypercapnia: Secondary | ICD-10-CM | POA: Diagnosis not present

## 2022-06-20 DIAGNOSIS — R52 Pain, unspecified: Secondary | ICD-10-CM | POA: Diagnosis not present

## 2022-06-20 DIAGNOSIS — J690 Pneumonitis due to inhalation of food and vomit: Secondary | ICD-10-CM | POA: Diagnosis not present

## 2022-06-20 DIAGNOSIS — E871 Hypo-osmolality and hyponatremia: Secondary | ICD-10-CM | POA: Diagnosis present

## 2022-06-20 DIAGNOSIS — R414 Neurologic neglect syndrome: Secondary | ICD-10-CM | POA: Diagnosis not present

## 2022-06-20 DIAGNOSIS — M7989 Other specified soft tissue disorders: Secondary | ICD-10-CM | POA: Diagnosis not present

## 2022-06-20 DIAGNOSIS — L538 Other specified erythematous conditions: Secondary | ICD-10-CM | POA: Diagnosis not present

## 2022-06-20 DIAGNOSIS — Z79899 Other long term (current) drug therapy: Secondary | ICD-10-CM

## 2022-06-20 DIAGNOSIS — I77810 Thoracic aortic ectasia: Secondary | ICD-10-CM | POA: Diagnosis present

## 2022-06-20 DIAGNOSIS — Z5320 Procedure and treatment not carried out because of patient's decision for unspecified reasons: Secondary | ICD-10-CM | POA: Diagnosis not present

## 2022-06-20 DIAGNOSIS — F10231 Alcohol dependence with withdrawal delirium: Secondary | ICD-10-CM | POA: Diagnosis not present

## 2022-06-20 DIAGNOSIS — Y92239 Unspecified place in hospital as the place of occurrence of the external cause: Secondary | ICD-10-CM | POA: Diagnosis not present

## 2022-06-20 DIAGNOSIS — R339 Retention of urine, unspecified: Secondary | ICD-10-CM | POA: Diagnosis not present

## 2022-06-20 DIAGNOSIS — R7401 Elevation of levels of liver transaminase levels: Secondary | ICD-10-CM | POA: Diagnosis not present

## 2022-06-20 DIAGNOSIS — E785 Hyperlipidemia, unspecified: Secondary | ICD-10-CM | POA: Diagnosis present

## 2022-06-20 DIAGNOSIS — F17200 Nicotine dependence, unspecified, uncomplicated: Secondary | ICD-10-CM | POA: Diagnosis present

## 2022-06-20 DIAGNOSIS — I493 Ventricular premature depolarization: Secondary | ICD-10-CM | POA: Diagnosis present

## 2022-06-20 DIAGNOSIS — I10 Essential (primary) hypertension: Secondary | ICD-10-CM | POA: Diagnosis not present

## 2022-06-20 DIAGNOSIS — I110 Hypertensive heart disease with heart failure: Secondary | ICD-10-CM | POA: Diagnosis present

## 2022-06-20 DIAGNOSIS — R1310 Dysphagia, unspecified: Secondary | ICD-10-CM | POA: Diagnosis not present

## 2022-06-20 DIAGNOSIS — Z6834 Body mass index (BMI) 34.0-34.9, adult: Secondary | ICD-10-CM

## 2022-06-20 DIAGNOSIS — L89610 Pressure ulcer of right heel, unstageable: Secondary | ICD-10-CM | POA: Diagnosis not present

## 2022-06-20 DIAGNOSIS — R7303 Prediabetes: Secondary | ICD-10-CM | POA: Diagnosis present

## 2022-06-20 DIAGNOSIS — Z720 Tobacco use: Secondary | ICD-10-CM | POA: Diagnosis not present

## 2022-06-20 DIAGNOSIS — J439 Emphysema, unspecified: Secondary | ICD-10-CM | POA: Diagnosis present

## 2022-06-20 DIAGNOSIS — F05 Delirium due to known physiological condition: Secondary | ICD-10-CM | POA: Diagnosis not present

## 2022-06-20 DIAGNOSIS — R471 Dysarthria and anarthria: Secondary | ICD-10-CM | POA: Diagnosis not present

## 2022-06-20 DIAGNOSIS — R410 Disorientation, unspecified: Secondary | ICD-10-CM | POA: Diagnosis not present

## 2022-06-20 DIAGNOSIS — F10931 Alcohol use, unspecified with withdrawal delirium: Secondary | ICD-10-CM | POA: Diagnosis not present

## 2022-06-20 DIAGNOSIS — I272 Pulmonary hypertension, unspecified: Secondary | ICD-10-CM | POA: Diagnosis present

## 2022-06-20 DIAGNOSIS — E876 Hypokalemia: Secondary | ICD-10-CM | POA: Diagnosis not present

## 2022-06-20 DIAGNOSIS — J9601 Acute respiratory failure with hypoxia: Secondary | ICD-10-CM | POA: Diagnosis not present

## 2022-06-20 DIAGNOSIS — R4182 Altered mental status, unspecified: Secondary | ICD-10-CM | POA: Diagnosis not present

## 2022-06-20 DIAGNOSIS — R451 Restlessness and agitation: Secondary | ICD-10-CM | POA: Diagnosis not present

## 2022-06-20 DIAGNOSIS — K521 Toxic gastroenteritis and colitis: Secondary | ICD-10-CM | POA: Diagnosis not present

## 2022-06-20 DIAGNOSIS — D7589 Other specified diseases of blood and blood-forming organs: Secondary | ICD-10-CM | POA: Diagnosis present

## 2022-06-20 DIAGNOSIS — I4510 Unspecified right bundle-branch block: Secondary | ICD-10-CM | POA: Diagnosis present

## 2022-06-20 DIAGNOSIS — L89890 Pressure ulcer of other site, unstageable: Secondary | ICD-10-CM | POA: Diagnosis not present

## 2022-06-20 DIAGNOSIS — E440 Moderate protein-calorie malnutrition: Secondary | ICD-10-CM | POA: Diagnosis present

## 2022-06-20 DIAGNOSIS — Z955 Presence of coronary angioplasty implant and graft: Secondary | ICD-10-CM

## 2022-06-20 DIAGNOSIS — T473X5A Adverse effect of saline and osmotic laxatives, initial encounter: Secondary | ICD-10-CM | POA: Diagnosis not present

## 2022-06-20 DIAGNOSIS — G9340 Encephalopathy, unspecified: Secondary | ICD-10-CM | POA: Diagnosis not present

## 2022-06-20 HISTORY — DX: Unstable angina: I20.0

## 2022-06-20 LAB — I-STAT VENOUS BLOOD GAS, ED
Acid-Base Excess: 4 mmol/L — ABNORMAL HIGH (ref 0.0–2.0)
Bicarbonate: 30.8 mmol/L — ABNORMAL HIGH (ref 20.0–28.0)
Calcium, Ion: 1.11 mmol/L — ABNORMAL LOW (ref 1.15–1.40)
HCT: 48 % (ref 39.0–52.0)
Hemoglobin: 16.3 g/dL (ref 13.0–17.0)
O2 Saturation: 49 %
Potassium: 3.7 mmol/L (ref 3.5–5.1)
Sodium: 138 mmol/L (ref 135–145)
TCO2: 32 mmol/L (ref 22–32)
pCO2, Ven: 50.4 mmHg (ref 44–60)
pH, Ven: 7.394 (ref 7.25–7.43)
pO2, Ven: 27 mmHg — CL (ref 32–45)

## 2022-06-20 LAB — COMPREHENSIVE METABOLIC PANEL
ALT: 31 U/L (ref 0–44)
AST: 40 U/L (ref 15–41)
Albumin: 3.6 g/dL (ref 3.5–5.0)
Alkaline Phosphatase: 58 U/L (ref 38–126)
Anion gap: 13 (ref 5–15)
BUN: 9 mg/dL (ref 8–23)
CO2: 27 mmol/L (ref 22–32)
Calcium: 8.7 mg/dL — ABNORMAL LOW (ref 8.9–10.3)
Chloride: 96 mmol/L — ABNORMAL LOW (ref 98–111)
Creatinine, Ser: 1.11 mg/dL (ref 0.61–1.24)
GFR, Estimated: >60 mL/min (ref >60)
Glucose, Bld: 115 mg/dL — ABNORMAL HIGH (ref 70–99)
Potassium: 3.6 mmol/L (ref 3.5–5.1)
Sodium: 136 mmol/L (ref 135–145)
Total Bilirubin: 1.4 mg/dL — ABNORMAL HIGH (ref 0.3–1.2)
Total Protein: 6.5 g/dL (ref 6.5–8.1)

## 2022-06-20 LAB — HEPARIN LEVEL (UNFRACTIONATED)
Heparin Unfractionated: 0.19 IU/mL — ABNORMAL LOW (ref 0.30–0.70)
Heparin Unfractionated: 0.13 IU/mL — ABNORMAL LOW (ref 0.30–0.70)

## 2022-06-20 LAB — RAPID URINE DRUG SCREEN, HOSP PERFORMED
Amphetamines: NOT DETECTED
Barbiturates: NOT DETECTED
Benzodiazepines: NOT DETECTED
Cocaine: NOT DETECTED
Opiates: NOT DETECTED
Tetrahydrocannabinol: NOT DETECTED

## 2022-06-20 LAB — CBC
HCT: 47.1 % (ref 39.0–52.0)
Hemoglobin: 15.9 g/dL (ref 13.0–17.0)
MCH: 34.7 pg — ABNORMAL HIGH (ref 26.0–34.0)
MCHC: 33.8 g/dL (ref 30.0–36.0)
MCV: 102.8 fL — ABNORMAL HIGH (ref 80.0–100.0)
Platelets: 242 10*3/uL (ref 150–400)
RBC: 4.58 MIL/uL (ref 4.22–5.81)
RDW: 14.6 % (ref 11.5–15.5)
WBC: 13.1 10*3/uL — ABNORMAL HIGH (ref 4.0–10.5)
nRBC: 0 % (ref 0.0–0.2)

## 2022-06-20 LAB — TROPONIN I (HIGH SENSITIVITY)
Troponin I (High Sensitivity): 319 ng/L (ref <18)
Troponin I (High Sensitivity): 379 ng/L (ref <18)

## 2022-06-20 LAB — RESP PANEL BY RT-PCR (RSV, FLU A&B, COVID)  RVPGX2
Influenza A by PCR: NEGATIVE
Influenza B by PCR: NEGATIVE
Resp Syncytial Virus by PCR: NEGATIVE
SARS Coronavirus 2 by RT PCR: NEGATIVE

## 2022-06-20 LAB — BRAIN NATRIURETIC PEPTIDE: B Natriuretic Peptide: 431.6 pg/mL — ABNORMAL HIGH (ref 0.0–100.0)

## 2022-06-20 LAB — VITAMIN B12: Vitamin B-12: 130 pg/mL — ABNORMAL LOW (ref 180–914)

## 2022-06-20 LAB — FOLATE: Folate: 3.1 ng/mL — ABNORMAL LOW (ref >5.9)

## 2022-06-20 LAB — ETHANOL: Alcohol, Ethyl (B): <10 mg/dL (ref <10)

## 2022-06-20 MED ORDER — LORAZEPAM 1 MG PO TABS
1.0000 mg | ORAL_TABLET | ORAL | Status: DC | PRN
  Administered 2022-06-20: 2 mg via ORAL
  Administered 2022-06-20 (×2): 4 mg via ORAL
  Administered 2022-06-21 (×2): 2 mg via ORAL
  Filled 2022-06-20 (×3): qty 2
  Filled 2022-06-20 (×2): qty 4

## 2022-06-20 MED ORDER — SODIUM CHLORIDE 0.9 % WEIGHT BASED INFUSION: 1.0000 mL/kg/h | INTRAVENOUS | Status: DC

## 2022-06-20 MED ORDER — SODIUM CHLORIDE 0.9% FLUSH: 3.0000 mL | INTRAVENOUS | Status: DC | PRN

## 2022-06-20 MED ORDER — SODIUM CHLORIDE 0.9 % WEIGHT BASED INFUSION: 3.0000 mL/kg/h | INTRAVENOUS | Status: AC

## 2022-06-20 MED ORDER — FOLIC ACID 1 MG PO TABS
1.0000 mg | ORAL_TABLET | Freq: Every day | ORAL | Status: DC
  Administered 2022-06-20 – 2022-06-21 (×2): 1 mg via ORAL
  Filled 2022-06-20 (×2): qty 1

## 2022-06-20 MED ORDER — IPRATROPIUM-ALBUTEROL 0.5-2.5 (3) MG/3ML IN SOLN
3.0000 mL | Freq: Once | RESPIRATORY_TRACT | Status: AC
  Administered 2022-06-20: 3 mL via RESPIRATORY_TRACT
  Filled 2022-06-20: qty 3

## 2022-06-20 MED ORDER — LISINOPRIL 20 MG PO TABS
20.0000 mg | ORAL_TABLET | Freq: Every day | ORAL | Status: DC
  Administered 2022-06-20 – 2022-06-24 (×3): 20 mg via ORAL
  Filled 2022-06-20 (×3): qty 1

## 2022-06-20 MED ORDER — HEPARIN BOLUS VIA INFUSION
4000.0000 [IU] | Freq: Once | INTRAVENOUS | Status: AC
  Administered 2022-06-20: 4000 [IU] via INTRAVENOUS
  Filled 2022-06-20: qty 4000

## 2022-06-20 MED ORDER — THIAMINE MONONITRATE 100 MG PO TABS
100.0000 mg | ORAL_TABLET | Freq: Every day | ORAL | Status: DC
  Administered 2022-06-20: 100 mg via ORAL
  Filled 2022-06-20 (×2): qty 1

## 2022-06-20 MED ORDER — HEPARIN BOLUS VIA INFUSION
3000.0000 [IU] | Freq: Once | INTRAVENOUS | Status: DC
  Filled 2022-06-20: qty 3000

## 2022-06-20 MED ORDER — METOPROLOL TARTRATE 25 MG PO TABS
25.0000 mg | ORAL_TABLET | Freq: Four times a day (QID) | ORAL | Status: DC
  Administered 2022-06-20 – 2022-06-21 (×5): 25 mg via ORAL
  Filled 2022-06-20 (×6): qty 1

## 2022-06-20 MED ORDER — HEPARIN (PORCINE) 25000 UT/250ML-% IV SOLN
1450.0000 [IU]/h | INTRAVENOUS | Status: DC
  Administered 2022-06-20: 1300 [IU]/h via INTRAVENOUS
  Administered 2022-06-20: 1550 [IU]/h via INTRAVENOUS
  Administered 2022-06-21 – 2022-06-22 (×2): 1850 [IU]/h via INTRAVENOUS
  Administered 2022-06-22: 1600 [IU]/h via INTRAVENOUS
  Administered 2022-06-23 – 2022-06-24 (×2): 1500 [IU]/h via INTRAVENOUS
  Filled 2022-06-20 (×7): qty 250

## 2022-06-20 MED ORDER — LORAZEPAM 1 MG PO TABS: 1.0000 mg | ORAL_TABLET | ORAL | Status: DC | PRN

## 2022-06-20 MED ORDER — ASPIRIN 81 MG PO CHEW: 81.0000 mg | CHEWABLE_TABLET | ORAL | Status: AC

## 2022-06-20 MED ORDER — NITROGLYCERIN 0.4 MG SL SUBL: 0.4000 mg | SUBLINGUAL_TABLET | SUBLINGUAL | Status: DC | PRN

## 2022-06-20 MED ORDER — HYDRALAZINE HCL 20 MG/ML IJ SOLN
10.0000 mg | Freq: Once | INTRAMUSCULAR | Status: AC
  Administered 2022-06-20: 10 mg via INTRAVENOUS
  Filled 2022-06-20: qty 1

## 2022-06-20 MED ORDER — ASPIRIN 81 MG PO CHEW
324.0000 mg | CHEWABLE_TABLET | Freq: Once | ORAL | Status: AC
  Administered 2022-06-20: 324 mg via ORAL
  Filled 2022-06-20: qty 4

## 2022-06-20 MED ORDER — SODIUM CHLORIDE 0.9 % IV SOLN
250.0000 mL | INTRAVENOUS | Status: DC | PRN
  Administered 2022-06-22 – 2022-07-01 (×3): 250 mL via INTRAVENOUS

## 2022-06-20 MED ORDER — HEPARIN BOLUS VIA INFUSION
2000.0000 [IU] | Freq: Once | INTRAVENOUS | Status: AC
  Administered 2022-06-20: 2000 [IU] via INTRAVENOUS
  Filled 2022-06-20: qty 2000

## 2022-06-20 MED ORDER — SODIUM CHLORIDE 0.9% FLUSH
3.0000 mL | Freq: Two times a day (BID) | INTRAVENOUS | Status: DC
  Administered 2022-06-20 – 2022-06-28 (×17): 3 mL via INTRAVENOUS
  Administered 2022-06-29: 10 mL via INTRAVENOUS
  Administered 2022-06-29 – 2022-07-14 (×18): 3 mL via INTRAVENOUS

## 2022-06-20 MED ORDER — ADULT MULTIVITAMIN W/MINERALS CH
1.0000 | ORAL_TABLET | Freq: Every day | ORAL | Status: DC
  Administered 2022-06-20 – 2022-07-14 (×23): 1 via ORAL
  Filled 2022-06-20 (×23): qty 1

## 2022-06-20 MED ORDER — THIAMINE HCL 100 MG/ML IJ SOLN
100.0000 mg | Freq: Every day | INTRAMUSCULAR | Status: DC
  Administered 2022-06-21 – 2022-06-24 (×4): 100 mg via INTRAVENOUS
  Filled 2022-06-20 (×5): qty 2

## 2022-06-20 MED ORDER — THIAMINE HCL 100 MG/ML IJ SOLN
100.0000 mg | Freq: Once | INTRAMUSCULAR | Status: AC
  Administered 2022-06-20: 100 mg via INTRAVENOUS
  Filled 2022-06-20: qty 2

## 2022-06-20 MED ORDER — NITROGLYCERIN 2 % TD OINT
1.0000 [in_us] | TOPICAL_OINTMENT | Freq: Once | TRANSDERMAL | Status: AC
  Administered 2022-06-20: 1 [in_us] via TOPICAL
  Filled 2022-06-20: qty 1

## 2022-06-20 NOTE — ED Notes (Signed)
Date and time results received: 06/20/22 0355 (use smartphrase ".now" to insert current time)  Test: troponin Critical Value: 319  Name of Provider Notified: Nechama Guard  Orders Received? Or Actions Taken?:

## 2022-06-20 NOTE — Progress Notes (Signed)
   Patient Name: [name] Oneal Date of Encounter: 06/20/2022 [place] Cardiologist: None  Interval Summary   No further chest pain.   Vital Signs   Vitals:   06/20/22 0758 06/20/22 0815 06/20/22 0833 06/20/22 0854  BP:  (!) 145/84  (!) 170/94  Pulse: 94 88 72 71  Resp: 15 (!) 21 20 17   Temp:    99 F (37.2 C)  TempSrc:    Oral  SpO2: 94% 94% 96% 97%  Weight:      Height:       No intake or output data in the 24 hours ending 06/20/22 1036    06/20/2022    2:39 AM 01/23/2016    9:19 PM  Last 3 Weights  Weight (lbs) 240 lb 233 lb 4 oz  Weight (kg) 108.863 kg 105.802 kg      Telemetry/ECG    NSR - Personally Reviewed  Physical Exam  GEN: No acute distress.   Neck: No JVD Cardiac: RRR, no murmurs, rubs, or gallops.  Respiratory: Clear to auscultation bilaterally. GI: Soft, nontender, non-distended  MS:   Mild edema.  Decreased pulses.   Assessment & Plan   Unstable angina/NSTEMI:    Admitted earlier today for cath.   Continue heparin and other meds as listed.    Follow on ETOH protocol.    Leg pain:  Likely claudication. Probable out patient work up if he will allow it .   For questions or updates, please contact [place] Please consult [email or web address] for contact info under        Signed, Minus Breeding, MD

## 2022-06-20 NOTE — ED Triage Notes (Signed)
Pt was BIB by [facility].Pt had CP all day - O2 was 90% 99% on 3L . SOB upon exertion. Pt was given 324 mg PO ASA and 1 Nitro and is now pain free.

## 2022-06-20 NOTE — H&P (Signed)
Cardiology Admission History and Physical:   Patient ID: [name] Oneal MRN: [id number]; DOB: 12-19-1945   Admission date: 06/20/2022  Primary Care Provider: Hillary Bow, MD [facility] Cardiologist: None  [facility] Electrophysiologist:  None   Chief Complaint:  Chest Pain, NSTEMI  Patient Profile:   [name] Oneal is a 77 y.o. male with PMHx HTN, EtOH dependence, current tobacco use, CAD (DES-Lcx in 2003) who presents with recurrent CP. Cardiology consulted by ED for further management of NSTEMI.   History of Present Illness:   [name] Oneal states that he presented to ED today for further evaluation of "gripping, heavy, substernal chest pressure that just wouldn't go away." He states that he experiences this pain intermittently, but that it "goes away after a few shots of liquor". On day of presentation, he states that he ran out of alcohol and had no way to treat his pain. Last EtOH at 0400 on 3/22.   On arrival to ED, patient was afebrile, BP 150-180 / 90-120, HR 70-90. Workup in ED included the following: - BMP with normal Cr, K3.6 - CBC with WBC 13.1, Hgb 15.9, HCT 47.1 - hsTnT 319. Repeat pending. - BNP 431.6 - EKG shows NSR, RBBB  Cardiology consulted for further management NSTEMI.   Past Medical History:  Diagnosis Date   Alcoholic ([place])    Hypertension     History reviewed. No pertinent surgical history.   Medications Prior to Admission: Prior to Admission medications   Medication Sig Start Date End Date Taking? Authorizing Provider  cyclobenzaprine (FLEXERIL) 10 MG tablet Take 1 tablet (10 mg total) by mouth 2 (two) times daily as needed for muscle spasms. Patient not taking: Reported on 09/16/2017 01/23/16   Montine Circle, PA-C  gabapentin (NEURONTIN) 300 MG capsule Take 1 capsule (300 mg total) by mouth 2 (two) times daily. 09/17/17   Ethelene Hal, NP  lisinopril-hydrochlorothiazide (PRINZIDE,ZESTORETIC) 20-25 MG tablet Take 1  tablet by mouth daily.    [provider]  meloxicam (MOBIC) 7.5 MG tablet Take 1 tablet (7.5 mg total) by mouth daily. Patient not taking: Reported on 09/16/2017 09/18/14   Street, [place], PA-C  metoprolol tartrate (LOPRESSOR) 100 MG tablet Take 100 mg by mouth 2 (two) times daily.    [provider]     Allergies:   No Known Allergies  Social History:   Social History   Socioeconomic History   Marital status: Legally Separated    Spouse name: Not on file   Number of children: Not on file   Years of education: Not on file   Highest education level: Not on file  Occupational History   Not on file  Tobacco Use   Smoking status: Every Day   Smokeless tobacco: Never  Substance and Sexual Activity   Alcohol use: Yes   Drug use: Not on file   Sexual activity: Not Currently  Other Topics Concern   Not on file  Social History Narrative   Not on file   Social Determinants of Health   Financial Resource Strain: Not on file  Food Insecurity: Not on file  Transportation Needs: Not on file  Physical Activity: Not on file  Stress: Not on file  Social Connections: Not on file  Intimate Partner Violence: Not on file    Family History:   The patient's family history is not on file.    Physical Exam/Data:   Vitals:   06/20/22 0245 06/20/22 0300 06/20/22 0345 06/20/22 0430  BP: Marland Kitchen)  151/120 (!) 176/90 (!) 164/99 (!) 155/96  Pulse: 70 78 70 84  Resp: 17 20 14 20   Temp:      TempSrc:      SpO2: 95% 97% 98% 96%  Weight:      Height:       No intake or output data in the 24 hours ending 06/20/22 0510    06/20/2022    2:39 AM 01/23/2016    9:19 PM  Last 3 Weights  Weight (lbs) 240 lb 233 lb 4 oz  Weight (kg) 108.863 kg 105.802 kg     Body mass index is 34.44 kg/m.  General:  NAD HEENT: normal Lymph: no adenopathy Neck: no JVD Endocrine:  No thryomegaly Vascular: No carotid bruits; FA pulses 2+ bilaterally without bruits  Cardiac:  clear S1, S2;  RRR Lungs:  clear to auscultation bilaterally, no wheezing, rhonchi or rales  Abd: soft, nontender, no hepatomegaly  Ext: no edema Musculoskeletal:  No deformities, BUE and BLE strength normal and equal Skin: warm and dry  Neuro:  CNs 2-12 intact, no focal abnormalities noted  EKG:  The ECG that was done 06/20/22 was personally reviewed and demonstrates NSR, RBBB. No acute ischemic change. No prior for comparison.   Relevant CV Studies: 2003 Coronary Angiography:  1. The LIMA was widely patent.  There was no subclavian stenosis.  There was     antegrade left vertebral flow.  2. Fluoroscopy demonstrated +2 calcification of the proximal LAD, +1     calcification of the proximal circumflex, and +1 calcification of the     proximal RCA.  There was no intracardiac or valvular calcification seen.  3. The main left coronary was normal.  4. The left anterior descending artery had mild to moderate aneurysmal     dilatation segmentally of the proximal LAD approximately 5 mm after its     origin extending before the first septal perforator branch.  There was     some streaming of flow through this but no thrombus present.  Careful     examination in multiple views of the ostial and proximal LAD revealed a     probable normal-appearing segment compared to the referenced LAD beyond     the first diagonal.  There was some minimal haziness in this area but no     thrombus present and good flow.  The remainder of the LAD had no     significant stenosis and coursed to the apex of the heart where it     bifurcated.  The first septal perforator branch arose opposite the large     first diagonal branch which trifurcated and was normal.  5. The circumflex artery gave off two small marginal branches proximally     followed by an atrial branch.  The third marginal branch was moderately     large and bifurcated and was normal.  Just beyond this in the mid     circumflex was a 95% segmental subtotal stenosis.   No thrombus was     evident and there was TIMI-3 flow to the distal circumflex which     bifurcated, was moderately large, and a small PABG branch.  6. The right coronary artery was a large dominant vessel.  There was 30%     narrowing in the proximal, proximal-mid, and mid portion beyond the RV     branch.  The remainder of the vessel was large, widely patent, with no     significant stenosis with  good flow to a large bifurcating PLA and PDA.  Laboratory Data:  High Sensitivity Troponin:   Recent Labs  Lab 06/20/22 0245  TROPONINIHS 319*      Chemistry Recent Labs  Lab 06/20/22 0245 06/20/22 0250  NA 136 138  K 3.6 3.7  CL 96*  --   CO2 27  --   GLUCOSE 115*  --   BUN 9  --   CREATININE 1.11  --   CALCIUM 8.7*  --   GFRNONAA >60  --   ANIONGAP 13  --     Recent Labs  Lab 06/20/22 0245  PROT 6.5  ALBUMIN 3.6  AST 40  ALT 31  ALKPHOS 58  BILITOT 1.4*   Hematology Recent Labs  Lab 06/20/22 0245 06/20/22 0250  WBC 13.1*  --   RBC 4.58  --   HGB 15.9 16.3  HCT 47.1 48.0  MCV 102.8*  --   MCH 34.7*  --   MCHC 33.8  --   RDW 14.6  --   PLT 242  --    BNP Recent Labs  Lab 06/20/22 0245  BNP 431.6*    DDimer No results for input(s): "DDIMER" in the last 168 hours.  Radiology/Studies:  DG Chest 2 View  Result Date: 06/20/2022 CLINICAL DATA:  Chest pain EXAM: CHEST - 2 VIEW COMPARISON:  09/18/2014 FINDINGS: Mild cardiac enlargement. No vascular congestion or edema. Mild emphysematous changes in the lungs with peribronchial thickening and scattered interstitial changes, likely chronic bronchitis. Calcified granulomas on the left. No focal consolidation. No pleural effusions. No pneumothorax. Mediastinal contours appear intact. Degenerative changes in the spine and shoulders. IMPRESSION: Emphysematous and chronic bronchitic changes in the lungs. Scattered calcified granulomas. No focal consolidation. Electronically Signed   By: Lucienne Capers M.D.   On:  06/20/2022 03:58      TIMI Risk Score for Unstable Angina or Non-ST Elevation MI:   The patient's TIMI risk score is 5, which indicates a 26% risk of all cause mortality, new or recurrent myocardial infarction or need for urgent revascularization in the next 14 days.      Assessment and Plan:   #NSTEMI #Chest Pain Patient reports frequent episodes of severe, squeezing, substernal CP, ultimately prompting presentation on 3/22. Known CAD with last coronary angiography in 2003 showing diffuse 30% RCA disease, moderate aneurysmal dilation pLAD, and 95% mLCx disease. Underwent successful DES-mLCx. He has since been lost to Cardiology follow-up. - Repeat 3hr hsTnT pending - TTE - S/p ASA 324mg . Continue ASA 81mg  QD. - Heparin gtt, ACS dosing - Resume metoprolol tartrate 25mg  q6h - Resume lisinopril 20mg  QD - Atorvastatin 80mg  QD - SL NTG PRN - Plan for coronary angiography Monday. Patient is willing to reverse DNR for procedure.   #Chronic Polysubstance Use #EtOH Dependence No history of EtOH withdrawal. Drinks ~20 shots daily. Last drink 0400 on 3/22. - CIWA protocol. - Urine tox screen pending  Severity of Illness: The appropriate patient status for this patient is INPATIENT. Inpatient status is judged to be reasonable and necessary in order to provide the required intensity of service to ensure the patient's safety. The patient's presenting symptoms, physical exam findings, and initial radiographic and laboratory data in the context of their chronic comorbidities is felt to place them at high risk for further clinical deterioration. Furthermore, it is not anticipated that the patient will be medically stable for discharge from the hospital within 2 midnights of admission.   * I certify that at  the point of admission it is my clinical judgment that the patient will require inpatient hospital care spanning beyond 2 midnights from the point of admission due to high intensity of service, high  risk for further deterioration and high frequency of surveillance required.*   For questions or updates, please contact [place] Please consult [email or web address] for contact info under     Signed, Delorse Limber, MD  06/20/2022 5:10 AM

## 2022-06-20 NOTE — Progress Notes (Addendum)
ANTICOAGULATION CONSULT NOTE - Follow Up Pharmacy Consult for Heparin Indication: chest pain/ACS/NSTEMI  No Known Allergies  Patient Measurements: Height: 5\' 10"  (177.8 cm) Weight: 108.9 kg (240 lb) IBW/kg (Calculated) : 73 Heparin Dosing Weight: 97 kg  Vital Signs: Temp: 99.1 F (37.3 C) (03/23 2215) Temp Source: Oral (03/23 2215) BP: 171/69 (03/23 2215) Pulse Rate: 79 (03/23 2129)  Labs: Recent Labs    06/20/22 0245 06/20/22 0250 06/20/22 0430 06/20/22 1231 06/20/22 2144  HGB 15.9 16.3  --   --   --   HCT 47.1 48.0  --   --   --   PLT 242  --   --   --   --   HEPARINUNFRC  --   --   --  0.19* 0.13*  CREATININE 1.11  --   --   --   --   TROPONINIHS 319*  --  379*  --   --      Estimated Creatinine Clearance: 71.1 mL/min (by C-G formula based on SCr of 1.11 mg/dL).   Assessment:  77 yr old male to begin IV heparin for ACS/NSTEMI.  Troponin 319. Not on anticoagulation prior to admission.  CBC normal.   Heparin level down further to 0.13 on infusion at 1550 units/hr. No bleeding per RN. RN notes that there was an IV infiltration ~1900 which caused small hematoma - bleeding stopped after pressure applied.  Goal of Therapy:  Heparin level 0.3-0.7 units/ml Monitor platelets by anticoagulation protocol: Yes   Plan:  Rebolus heparin 2000 units Increase heparin drip to 1850 units/hr Heparin level 8 hrs   Sherlon Handing, PharmD, BCPS Please see amion for complete clinical pharmacist phone list 06/20/2022 10:18 PM

## 2022-06-20 NOTE — Progress Notes (Signed)
ANTICOAGULATION CONSULT NOTE - Follow Up Pharmacy Consult for Heparin Indication: chest pain/ACS/NSTEMI  No Known Allergies  Patient Measurements: Height: 5\' 10"  (177.8 cm) Weight: 108.9 kg (240 lb) IBW/kg (Calculated) : 73 Heparin Dosing Weight: 97 kg  Vital Signs: Temp: 99 F (37.2 C) (03/23 0854) Temp Source: Oral (03/23 0854) BP: 168/94 (03/23 1106) Pulse Rate: 68 (03/23 1106)  Labs: Recent Labs    06/20/22 0245 06/20/22 0250 06/20/22 0430 06/20/22 1231  HGB 15.9 16.3  --   --   HCT 47.1 48.0  --   --   PLT 242  --   --   --   HEPARINUNFRC  --   --   --  0.19*  CREATININE 1.11  --   --   --   TROPONINIHS 319*  --  379*  --      Estimated Creatinine Clearance: 71.1 mL/min (by C-G formula based on SCr of 1.11 mg/dL).   Medical History: Past Medical History:  Diagnosis Date   Alcoholic ([place])    Hypertension    Assessment:  77 yr old male to begin IV heparin for ACS/NSTEMI.  Troponin 319. Not on anticoagulation prior to admission.  CBC normal. Heparin level subtherapeutic at 0.19. Per Rn no issues with heparin draw or pauses in the drip running. No signs/symptoms of bleeding  Goal of Therapy:  Heparin level 0.3-0.7 units/ml Monitor platelets by anticoagulation protocol: Yes   Plan:  Increase heparin drip to 1550 units/hr (increase by ~3units/kg/hr) Heparin level 8 hrs  Daily heparin level and CBC while on heparin Monitor for signs/symptoms of bleeding  Sandford Craze, PharmD. [facility] Acute Care PGY-1  06/20/2022 1:03 PM

## 2022-06-20 NOTE — Progress Notes (Signed)
Arrived to [place] from Emergency Department.  Bedside handoff completed with ED RN Chloe. Handoff of heparin gtt completed. Placed on cardiac monitor and vitals obtained. Oriented to room and call bell use.

## 2022-06-20 NOTE — Progress Notes (Addendum)
   06/20/22 2304  Notify: Charge Nurse/RN  Name of Charge Nurse/RN Notified Heather RN  Notify: Rapid Response  Name of Rapid Response RN Notified Shanon Brow RN  Date Rapid Response Notified 06/20/22  Time Rapid Response Notified 2304   MD notified also. CIWA >20  Pt visibly tremor-ing visible from door. No nausea/vomiting. Pt does not report any tactile/auditory disturbances. Reaching towards something that is not there in the air. Pulling lines, tubes out. 2 PIV's pulled out. No headaches reported. No anxiety/agitation, mostly restless. Getting out of bed multiple times, rolling around in bed, unable to explain why. Incontinence.

## 2022-06-20 NOTE — Progress Notes (Incomplete)
   06/20/22 2304  Notify: Charge Nurse/RN  Name of Charge Nurse/RN Notified Heather RN  Notify: Rapid Response  Name of Rapid Response RN Notified Shanon Brow RN  Date Rapid Response Notified 06/20/22  Time Rapid Response Notified 2304    CIWA >20

## 2022-06-20 NOTE — ED Notes (Signed)
Pt unable to void at this time. 

## 2022-06-20 NOTE — ED Provider Notes (Signed)
[facility] Provider Note   CSN: [id number] Arrival date & time: 06/20/22  0236     History  Chief Complaint  Patient presents with   Chest Pain   [name] Oneal is a 77 y.o. male. 92 year smoker With PMH of CAD s/p DES per patient, untreated HTN, chronic EtOH use who presents after episode of chest pain.  Of note, was satting 89 to 90% with EMS started on 3 L nasal cannula with improvement of O2 sats as well as given 1 nitroglycerin and 324 mg chewable aspirin. Patient tells me he has ongoing episodes of chest pain where he feels like he has a grip and pressure-like sensation in his chest.  Is nonradiating and he denies any associated symptoms.  He says he has been dealing with shortness of breath on exertion that has been worsening over time but he is also a smoker every day for the past 60 years.  He also is a drinker almost every day.  He says he always stops drinking a day or 2 every here and there.  His last drink was around 3 AM this morning.  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  He does not take any blood pressure or cholesterol medicines.  He had the grip like sensation throughout the day today but it went away by the time EMS arrived. This has happened multiple times before but usually drinks the pain away.  He was given nitroglycerin and denies any pain on arrival to the ED.  He denies any withdrawal from alcohol denies any shakes, hallucinations or seizures.  Denies any fevers or increased cough recently.   Chest Pain      Home Medications Prior to Admission medications   Medication Sig Start Date End Date Taking? Authorizing Provider  cyclobenzaprine (FLEXERIL) 10 MG tablet Take 1 tablet (10 mg total) by mouth 2 (two) times daily as needed for muscle spasms. Patient not taking: Reported on 09/16/2017 01/23/16   Montine Circle, PA-C  gabapentin (NEURONTIN) 300 MG capsule Take 1 capsule (300 mg total) by mouth 2 (two)  times daily. 09/17/17   Ethelene Hal, NP  lisinopril-hydrochlorothiazide (PRINZIDE,ZESTORETIC) 20-25 MG tablet Take 1 tablet by mouth daily.    [provider]  meloxicam (MOBIC) 7.5 MG tablet Take 1 tablet (7.5 mg total) by mouth daily. Patient not taking: Reported on 09/16/2017 09/18/14   Street, [place], PA-C  metoprolol tartrate (LOPRESSOR) 100 MG tablet Take 100 mg by mouth 2 (two) times daily.    [provider]      Allergies    Patient has no known allergies.    Review of Systems   Review of Systems  Cardiovascular:  Positive for chest pain.    Physical Exam Updated Vital Signs BP (!) 155/96   Pulse 84   Temp 98 F (36.7 C) (Oral)   Resp 20   Ht 5\' 10"  (1.778 m)   Wt 108.9 kg   SpO2 96%   BMI 34.44 kg/m  Physical Exam Constitutional: Alert and oriented.  Pleasant male, no acute distress Eyes: Conjunctivae are normal. ENT      Head: Normocephalic and atraumatic. Cardiovascular: S1, S2, regular rate, equal palpable radial and DP pulses Respiratory: Mild tachypnea, diffuse end expiratory wheezing, O2 sat 95-96 on 2 L nasal cannula Gastrointestinal: Distended but nontender Musculoskeletal: Normal range of motion in all extremities. Trace nontender nonerythematous equal pitting edema bilateral shins Neurologic: Normal speech and language.  Moving extremities equally with 5 out of 5 strength.  Sensation grossly intact.  No facial droop.  No gross focal neurologic deficits are appreciated.  No severe tremor on extension of extremities. Skin: Skin is warm, dry and intact. No rash noted. Psychiatric: Mood and affect are normal. Speech and behavior are normal.  ED Results / Procedures / Treatments   Labs (all labs ordered are listed, but only abnormal results are displayed) Labs Reviewed  CBC - Abnormal; Notable for the following components:      Result Value   WBC 13.1 (*)    MCV 102.8 (*)    MCH 34.7 (*)    All other components within normal  limits  COMPREHENSIVE METABOLIC PANEL - Abnormal; Notable for the following components:   Chloride 96 (*)    Glucose, Bld 115 (*)    Calcium 8.7 (*)    Total Bilirubin 1.4 (*)    All other components within normal limits  BRAIN NATRIURETIC PEPTIDE - Abnormal; Notable for the following components:   B Natriuretic Peptide 431.6 (*)    All other components within normal limits  I-STAT VENOUS BLOOD GAS, ED - Abnormal; Notable for the following components:   pO2, Ven 27 (*)    Bicarbonate 30.8 (*)    Acid-Base Excess 4.0 (*)    Calcium, Ion 1.11 (*)    All other components within normal limits  TROPONIN I (HIGH SENSITIVITY) - Abnormal; Notable for the following components:   Troponin I (High Sensitivity) 319 (*)    All other components within normal limits  RESP PANEL BY RT-PCR (RSV, FLU A&B, COVID)  RVPGX2  ETHANOL  HEPARIN LEVEL (UNFRACTIONATED)  TROPONIN I (HIGH SENSITIVITY)    EKG EKG Interpretation  Date/Time:  Saturday June 20 2022 02:42:33 EDT Ventricular Rate:  71 PR Interval:  220 QRS Duration: 177 QT Interval:  486 QTC Calculation: 529 R Axis:   122 Text Interpretation: Sinus rhythm Atrial premature complexes Prolonged PR interval Right bundle branch block, new ST depr, consider ischemia, inferior leads , new Confirmed by Georgina Snell [phone]) on 06/20/2022 2:45:49 AM  Radiology DG Chest 2 View  Result Date: 06/20/2022 CLINICAL DATA:  Chest pain EXAM: CHEST - 2 VIEW COMPARISON:  09/18/2014 FINDINGS: Mild cardiac enlargement. No vascular congestion or edema. Mild emphysematous changes in the lungs with peribronchial thickening and scattered interstitial changes, likely chronic bronchitis. Calcified granulomas on the left. No focal consolidation. No pleural effusions. No pneumothorax. Mediastinal contours appear intact. Degenerative changes in the spine and shoulders. IMPRESSION: Emphysematous and chronic bronchitic changes in the lungs. Scattered calcified granulomas.  No focal consolidation. Electronically Signed   By: Lucienne Capers M.D.   On: 06/20/2022 03:58    Procedures .Critical Care  Performed by: Elgie Congo, MD Authorized by: Elgie Congo, MD   Critical care provider statement:    Critical care time (minutes):  45   Critical care was necessary to treat or prevent imminent or life-threatening deterioration of the following conditions:  Cardiac failure and respiratory failure   Critical care was time spent personally by me on the following activities:  Development of treatment plan with patient or surrogate, discussions with consultants, evaluation of patient's response to treatment, examination of patient, ordering and review of laboratory studies, ordering and review of radiographic studies, ordering and performing treatments and interventions, pulse oximetry, re-evaluation of patient's condition, review of old charts and obtaining history from patient or surrogate   Care discussed with: admitting provider  Medications Ordered in ED Medications  heparin ADULT infusion 100 units/mL (25000 units/251mL) (1,300 Units/hr Intravenous New Bag/Given 06/20/22 0454)  nitroGLYCERIN (NITROGLYN) 2 % ointment 1 inch (1 inch Topical Given 06/20/22 0344)  ipratropium-albuterol (DUONEB) 0.5-2.5 (3) MG/3ML nebulizer solution 3 mL (3 mLs Nebulization Given 06/20/22 0344)  thiamine (VITAMIN B1) injection 100 mg (100 mg Intravenous Given 06/20/22 0344)  hydrALAZINE (APRESOLINE) injection 10 mg (10 mg Intravenous Given 06/20/22 0345)  heparin bolus via infusion 4,000 Units (4,000 Units Intravenous Bolus from Bag 06/20/22 0454)    ED Course/ Medical Decision Making/ A&P   {                            Medical Decision Making  [name] Oneal is a 77 y.o. male. 47 year smoker With PMH of CAD s/p DES per patient, untreated HTN, chronic EtOH use who presents after episode of chest pain.  Of note, was satting 89 to 90% with EMS started on 3 L nasal  cannula with improvement of O2 sats as well as given 1 nitroglycerin and 324 mg chewable aspirin.  Patient's initial EKG with new RBBB and new ST depression in inferior leads. No active CP on arrival.   Patient has significant history of CAD however is noncompliant with all medications. Concern for NSTEMI/unstable angina.   Troponin resulted 319. BNP 431 however no pulmonary edema on CXR personally reviewed by me. 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   Due to concern for unstable angina/NSTEMI, started patient on heparin drip, additionally, BP control with IV hydralazine and nitroglycerin ointment.  Discussed with cardiology on call fellow Dr Grayce Sessions for plans for inpatient cath and admission for NSTEMI.     Amount and/or Complexity of Data Reviewed Labs: ordered. Radiology: ordered.  Risk Prescription drug management. Decision regarding hospitalization.    Final Clinical Impression(s) / ED Diagnoses Final diagnoses:  NSTEMI (non-ST elevated myocardial infarction) [facility])  Hypertensive urgency    Rx / DC Orders ED Discharge Orders     None         Elgie Congo, MD 06/20/22 [phone]

## 2022-06-20 NOTE — Progress Notes (Signed)
ANTICOAGULATION CONSULT NOTE - Initial Consult  Pharmacy Consult for Heparin Indication: chest pain/ACS/NSTEMI  No Known Allergies  Patient Measurements: Height: 5\' 10"  (177.8 cm) Weight: 108.9 kg (240 lb) IBW/kg (Calculated) : 73 Heparin Dosing Weight: 97 kg  Vital Signs: Temp: 98 F (36.7 C) (03/23 0244) Temp Source: Oral (03/23 0244) BP: 155/96 (03/23 0430) Pulse Rate: 84 (03/23 0430)  Labs: Recent Labs    06/20/22 0245 06/20/22 0250  HGB 15.9 16.3  HCT 47.1 48.0  PLT 242  --   CREATININE 1.11  --   TROPONINIHS 319*  --     Estimated Creatinine Clearance: 71.1 mL/min (by C-G formula based on SCr of 1.11 mg/dL).   Medical History: Past Medical History:  Diagnosis Date   Alcoholic ([place])    Hypertension    Assessment:  77 yr old male to begin IV heparin for ACS/NSTEMI.  Troponin 319. Not on anticoagulation prior to admission.  CBC normal.  Goal of Therapy:  Heparin level 0.3-0.7 units/ml Monitor platelets by anticoagulation protocol: Yes   Plan:  Heparin 4000 units IV x 1 Heparin drip to begin at 1300 units/hr Heparin level ~8 hrs after drip begins. Daily heparin level and CBC while on heparin.  Arty Baumgartner, [place] 06/20/2022,4:42 AM

## 2022-06-20 NOTE — Plan of Care (Signed)
Admitted from Emergency Department today. Updated on plan of care. On heparin gtt currently. Daughter at bedside updated. Possible cardiac catheterization on Monday 06/22/2022.   Problem: Education: Goal: Knowledge of General Education information will improve Description: Including pain rating scale, medication(s)/side effects and non-pharmacologic comfort measures Outcome: Progressing   Problem: Health Behavior/Discharge Planning: Goal: Ability to manage health-related needs will improve Outcome: Progressing   Problem: Clinical Measurements: Goal: Ability to maintain clinical measurements within normal limits will improve Outcome: Progressing Goal: Will remain free from infection Outcome: Progressing Goal: Diagnostic test results will improve Outcome: Progressing Goal: Respiratory complications will improve Outcome: Progressing Goal: Cardiovascular complication will be avoided Outcome: Progressing

## 2022-06-20 NOTE — ED Notes (Signed)
ED TO INPATIENT HANDOFF REPORT  ED Nurse Name and Phone #: [phone], Cathlean Sauer Name/Age/Gender [name] Oneal 77 y.o. male Room/Bed: 016C/016C  Code Status   Code Status: DNR  Home/SNF/Other Home Patient oriented to: self, place, time, and situation Is this baseline? Yes   Triage Complete: Triage complete  Chief Complaint Unstable angina ([place]) [I20.0]  Triage Note Pt was BIB by [facility].Pt had CP all day - O2 was 90% 99% on 3L . SOB upon exertion. Pt was given 324 mg PO ASA and 1 Nitro and is now pain free.     Allergies No Known Allergies  Level of Care/Admitting Diagnosis ED Disposition     ED Disposition  Admit   Condition  --   Comment  Hospital Area: [place] [[id number]]  Level of Care: Telemetry Cardiac [[id number]]  May admit patient to [facility] or [facility] if equivalent level of care is available:: No  Covid Evaluation: Asymptomatic - no recent exposure (last 10 days) testing not required  Diagnosis: [facility]ina [id number]  Admitting Physician: Minus Breeding [phone]  Attending Physician: Minus Breeding [phone]  Bed request comments: 6e  Certification:: I certify this patient will need inpatient services for at least 2 midnights  Estimated Length of Stay: 4          B Medical/Surgery History Past Medical History:  Diagnosis Date   Alcoholic ([place])    Hypertension    History reviewed. No pertinent surgical history.   A IV Location/Drains/Wounds Patient Lines/Drains/Airways Status     Active Line/Drains/Airways     Name Placement date Placement time Site Days   Peripheral IV 06/20/22 20 G 1" Anterior;Left Forearm 06/20/22  0209  Forearm  less than 1   Peripheral IV 06/20/22 20 G 1" Right Forearm 06/20/22  0545  Forearm  less than 1            Intake/Output Last 24 hours No intake or output data in the 24 hours ending 06/20/22 0758  Labs/Imaging Results for orders placed or performed during the hospital  encounter of 06/20/22 (from the past 48 hour(s))  CBC     Status: Abnormal   Collection Time: 06/20/22  2:45 AM  Result Value Ref Range   WBC 13.1 (H) 4.0 - 10.5 K/uL   RBC 4.58 4.22 - 5.81 MIL/uL   Hemoglobin 15.9 13.0 - 17.0 g/dL   HCT 47.1 39.0 - 52.0 %   MCV 102.8 (H) 80.0 - 100.0 fL   MCH 34.7 (H) 26.0 - 34.0 pg   MCHC 33.8 30.0 - 36.0 g/dL   RDW 14.6 11.5 - 15.5 %   Platelets 242 150 - 400 K/uL   nRBC 0.0 0.0 - 0.2 %    Comment: Performed at [facility], [place] [address], [place], [place] [address]  Troponin I (High Sensitivity)     Status: Abnormal   Collection Time: 06/20/22  2:45 AM  Result Value Ref Range   Troponin I (High Sensitivity) 319 (HH) <18 ng/L    Comment: CRITICAL RESULT CALLED TO, READ BACK BY AND VERIFIED WITH Malachy Chamber, RN AT [phone] ON 06/20/22 BY H. HOWARD. (NOTE) Elevated high sensitivity troponin I (hsTnI) values and significant  changes across serial measurements may suggest ACS but many other  chronic and acute conditions are known to elevate hsTnI results.  Refer to the "Links" section for chest pain algorithms and additional  guidance. Performed at [facility], [address] [place], [place] [address]   Comprehensive metabolic panel     Status: Abnormal   Collection Time: 06/20/22  2:45 AM  Result Value Ref Range   Sodium 136 135 - 145 mmol/L   Potassium 3.6 3.5 - 5.1 mmol/L   Chloride 96 (L) 98 - 111 mmol/L   CO2 27 22 - 32 mmol/L   Glucose, Bld 115 (H) 70 - 99 mg/dL    Comment: Glucose reference range applies only to samples taken after fasting for at least 8 hours.   BUN 9 8 - 23 mg/dL   Creatinine, Ser 1.11 0.61 - 1.24 mg/dL   Calcium 8.7 (L) 8.9 - 10.3 mg/dL   Total Protein 6.5 6.5 - 8.1 g/dL   Albumin 3.6 3.5 - 5.0 g/dL   AST 40 15 - 41 U/L   ALT 31 0 - 44 U/L   Alkaline Phosphatase 58 38 - 126 U/L   Total Bilirubin 1.4 (H) 0.3 - 1.2 mg/dL   GFR, Estimated >60 >60 mL/min    Comment: (NOTE) Calculated using the CKD-EPI  Creatinine Equation (2021)    Anion gap 13 5 - 15    Comment: Performed at [facility] [address], [place], [place] [address]  Brain natriuretic peptide     Status: Abnormal   Collection Time: 06/20/22  2:45 AM  Result Value Ref Range   B Natriuretic Peptide 431.6 (H) 0.0 - 100.0 pg/mL    Comment: Performed at [facility] [address] [place], [place] [address]  Ethanol     Status: None   Collection Time: 06/20/22  2:45 AM  Result Value Ref Range   Alcohol, Ethyl (B) <10 <10 mg/dL    Comment: (NOTE) Lowest detectable limit for serum alcohol is 10 mg/dL.  For medical purposes only. Performed at [facility], [place] [address], [place], [place] [address]   Resp panel by RT-PCR (RSV, Flu A&B, Covid) Anterior Nasal Swab     Status: None   Collection Time: 06/20/22  2:47 AM   Specimen: Anterior Nasal Swab  Result Value Ref Range   SARS Coronavirus 2 by RT PCR NEGATIVE NEGATIVE   Influenza A by PCR NEGATIVE NEGATIVE   Influenza B by PCR NEGATIVE NEGATIVE    Comment: (NOTE) The Xpert Xpress SARS-CoV-2/FLU/RSV plus assay is intended as an aid in the diagnosis of influenza from Nasopharyngeal swab specimens and should not be used as a sole basis for treatment. Nasal washings and aspirates are unacceptable for Xpert Xpress SARS-CoV-2/FLU/RSV testing.  Fact Sheet for Patients: EntrepreneurPulse.com.au  Fact Sheet for Healthcare Providers: IncredibleEmployment.be  This test is not yet approved or cleared by the [place] FDA and has been authorized for detection and/or diagnosis of SARS-CoV-2 by FDA under an Emergency Use Authorization (EUA). This EUA will remain in effect (meaning this test can be used) for the duration of the COVID-19 declaration under Section 564(b)(1) of the Act, 21 U.S.C. section 360bbb-3(b)(1), unless the authorization is terminated or revoked.     Resp Syncytial Virus by PCR NEGATIVE NEGATIVE     Comment: (NOTE) Fact Sheet for Patients: EntrepreneurPulse.com.au  Fact Sheet for Healthcare Providers: IncredibleEmployment.be  This test is not yet approved or cleared by the [place] FDA and has been authorized for detection and/or diagnosis of SARS-CoV-2 by FDA under an Emergency Use Authorization (EUA). This EUA will remain in effect (meaning this test can be used) for the duration of the COVID-19 declaration  under Section 564(b)(1) of the Act, 21 U.S.C. section 360bbb-3(b)(1), unless the authorization is terminated or revoked.  Performed at [facility], [place] [address], [place], [place] [address]   I-Stat venous blood gas, ED     Status: Abnormal   Collection Time: 06/20/22  2:50 AM  Result Value Ref Range   pH, Ven 7.394 7.25 - 7.43   pCO2, Ven 50.4 44 - 60 mmHg   pO2, Ven 27 (LL) 32 - 45 mmHg   Bicarbonate 30.8 (H) 20.0 - 28.0 mmol/L   TCO2 32 22 - 32 mmol/L   O2 Saturation 49 %   Acid-Base Excess 4.0 (H) 0.0 - 2.0 mmol/L   Sodium 138 135 - 145 mmol/L   Potassium 3.7 3.5 - 5.1 mmol/L   Calcium, Ion 1.11 (L) 1.15 - 1.40 mmol/L   HCT 48.0 39.0 - 52.0 %   Hemoglobin 16.3 13.0 - 17.0 g/dL   Sample type VENOUS    Comment NOTIFIED PHYSICIAN   Troponin I (High Sensitivity)     Status: Abnormal   Collection Time: 06/20/22  4:30 AM  Result Value Ref Range   Troponin I (High Sensitivity) 379 (HH) <18 ng/L    Comment: CRITICAL VALUE NOTED. VALUE IS CONSISTENT WITH PREVIOUSLY REPORTED/CALLED VALUE (NOTE) Elevated high sensitivity troponin I (hsTnI) values and significant  changes across serial measurements may suggest ACS but many other  chronic and acute conditions are known to elevate hsTnI results.  Refer to the "Links" section for chest pain algorithms and additional  guidance. Performed at [facility], [place] [address] [place], [place] [address]   Rapid urine drug screen (hospital performed)     Status: None    Collection Time: 06/20/22  6:20 AM  Result Value Ref Range   Opiates NONE DETECTED NONE DETECTED   Cocaine NONE DETECTED NONE DETECTED   Benzodiazepines NONE DETECTED NONE DETECTED   Amphetamines NONE DETECTED NONE DETECTED   Tetrahydrocannabinol NONE DETECTED NONE DETECTED   Barbiturates NONE DETECTED NONE DETECTED    Comment: (NOTE) DRUG SCREEN FOR MEDICAL PURPOSES ONLY.  IF CONFIRMATION IS NEEDED FOR ANY PURPOSE, NOTIFY LAB WITHIN 5 DAYS.  LOWEST DETECTABLE LIMITS FOR URINE DRUG SCREEN Drug Class                     Cutoff (ng/mL) Amphetamine and metabolites    1000 Barbiturate and metabolites    200 Benzodiazepine                 200 Opiates and metabolites        300 Cocaine and metabolites        300 THC                            50 Performed at [facility], [place] [address], [place], [place] [address]    DG Chest 2 View  Result Date: 06/20/2022 CLINICAL DATA:  Chest pain EXAM: CHEST - 2 VIEW COMPARISON:  09/18/2014 FINDINGS: Mild cardiac enlargement. No vascular congestion or edema. Mild emphysematous changes in the lungs with peribronchial thickening and scattered interstitial changes, likely chronic bronchitis. Calcified granulomas on the left. No focal consolidation. No pleural effusions. No pneumothorax. Mediastinal contours appear intact. Degenerative changes in the spine and shoulders. IMPRESSION: Emphysematous and chronic bronchitic changes in the lungs. Scattered calcified granulomas. No focal consolidation. Electronically Signed   By: Lucienne Capers M.D.   On:  06/20/2022 03:58    Pending Labs Unresulted Labs (From admission, onward)     Start     Ordered   06/21/22 0500  Heparin level (unfractionated)  Daily,   R      06/20/22 0441   06/21/22 0500  CBC  Daily,   R      06/20/22 0441   06/21/22 [id number]  Basic metabolic panel  Daily,   R      06/20/22 0529   06/21/22 0500  Magnesium  Daily,   R      06/20/22 0529   06/20/22 1300  Heparin level  (unfractionated)  Once-Timed,   URGENT        06/20/22 0441   06/20/22 0532  Vitamin B12  Once,   URGENT        06/20/22 0531   06/20/22 0532  Folate  Once,   URGENT        06/20/22 0531            Vitals/Pain Today's Vitals   06/20/22 0701 06/20/22 0730 06/20/22 0745 06/20/22 0758  BP:  (!) 153/83 (!) 165/83   Pulse: 84 85 88 94  Resp: 16 17 17 15   Temp:      TempSrc:      SpO2: 91% 90% 96% 94%  Weight:      Height:      PainSc:        Isolation Precautions No active isolations  Medications Medications  heparin ADULT infusion 100 units/mL (25000 units/250mL) (1,300 Units/hr Intravenous New Bag/Given 06/20/22 0454)  nitroGLYCERIN (NITROSTAT) SL tablet 0.4 mg (has no administration in time range)  metoprolol tartrate (LOPRESSOR) tablet 25 mg (25 mg Oral Given 06/20/22 0757)  lisinopril (ZESTRIL) tablet 20 mg (has no administration in time range)  multivitamin with minerals tablet 1 tablet (has no administration in time range)  nitroGLYCERIN (NITROGLYN) 2 % ointment 1 inch (1 inch Topical Given 06/20/22 0344)  ipratropium-albuterol (DUONEB) 0.5-2.5 (3) MG/3ML nebulizer solution 3 mL (3 mLs Nebulization Given 06/20/22 0344)  thiamine (VITAMIN B1) injection 100 mg (100 mg Intravenous Given 06/20/22 0344)  hydrALAZINE (APRESOLINE) injection 10 mg (10 mg Intravenous Given 06/20/22 0345)  heparin bolus via infusion 4,000 Units (4,000 Units Intravenous Bolus from Bag 06/20/22 0454)  aspirin chewable tablet 324 mg (324 mg Oral Given 06/20/22 0757)    Mobility walks     Focused Assessments    R Recommendations: See Admitting Provider Note  Report given to:   Additional Notes: uses urinal

## 2022-06-21 DIAGNOSIS — I200 Unstable angina: Secondary | ICD-10-CM

## 2022-06-21 LAB — MAGNESIUM
Magnesium: 2 mg/dL (ref 1.7–2.4)
Magnesium: 1.9 mg/dL (ref 1.7–2.4)

## 2022-06-21 LAB — CBC
HCT: 45.8 % (ref 39.0–52.0)
Hemoglobin: 15 g/dL (ref 13.0–17.0)
MCH: 34.2 pg — ABNORMAL HIGH (ref 26.0–34.0)
MCHC: 32.8 g/dL (ref 30.0–36.0)
MCV: 104.6 fL — ABNORMAL HIGH (ref 80.0–100.0)
Platelets: 249 10*3/uL (ref 150–400)
RBC: 4.38 MIL/uL (ref 4.22–5.81)
RDW: 14.8 % (ref 11.5–15.5)
WBC: 13.1 10*3/uL — ABNORMAL HIGH (ref 4.0–10.5)
nRBC: 0 % (ref 0.0–0.2)

## 2022-06-21 LAB — BLOOD GAS, VENOUS
Acid-Base Excess: 2.2 mmol/L — ABNORMAL HIGH (ref 0.0–2.0)
Bicarbonate: 28.3 mmol/L — ABNORMAL HIGH (ref 20.0–28.0)
O2 Saturation: 97.9 %
Patient temperature: 37
pCO2, Ven: 49 mmHg (ref 44–60)
pH, Ven: 7.37 (ref 7.25–7.43)
pO2, Ven: 97 mmHg — ABNORMAL HIGH (ref 32–45)

## 2022-06-21 LAB — GLUCOSE, CAPILLARY
Glucose-Capillary: 130 mg/dL — ABNORMAL HIGH (ref 70–99)
Glucose-Capillary: 107 mg/dL — ABNORMAL HIGH (ref 70–99)
Glucose-Capillary: 123 mg/dL — ABNORMAL HIGH (ref 70–99)

## 2022-06-21 LAB — HEPARIN LEVEL (UNFRACTIONATED)
Heparin Unfractionated: 0.64 IU/mL (ref 0.30–0.70)
Heparin Unfractionated: 0.67 IU/mL (ref 0.30–0.70)

## 2022-06-21 LAB — BASIC METABOLIC PANEL
Anion gap: 7 (ref 5–15)
BUN: 10 mg/dL (ref 8–23)
CO2: 28 mmol/L (ref 22–32)
Calcium: 8.5 mg/dL — ABNORMAL LOW (ref 8.9–10.3)
Chloride: 103 mmol/L (ref 98–111)
Creatinine, Ser: 1.02 mg/dL (ref 0.61–1.24)
GFR, Estimated: >60 mL/min (ref >60)
Glucose, Bld: 103 mg/dL — ABNORMAL HIGH (ref 70–99)
Potassium: 3.9 mmol/L (ref 3.5–5.1)
Sodium: 138 mmol/L (ref 135–145)

## 2022-06-21 LAB — MRSA NEXT GEN BY PCR, NASAL: MRSA by PCR Next Gen: NOT DETECTED

## 2022-06-21 LAB — POTASSIUM: Potassium: 4.1 mmol/L (ref 3.5–5.1)

## 2022-06-21 MED ORDER — LORAZEPAM 2 MG/ML IJ SOLN
1.0000 mg | INTRAMUSCULAR | Status: DC | PRN
  Administered 2022-06-22 – 2022-06-23 (×6): 2 mg via INTRAVENOUS
  Filled 2022-06-21 (×7): qty 1

## 2022-06-21 MED ORDER — PHENOBARBITAL SODIUM 130 MG/ML IJ SOLN
97.5000 mg | Freq: Three times a day (TID) | INTRAMUSCULAR | Status: DC
  Administered 2022-06-21 (×2): 97.5 mg via INTRAVENOUS
  Filled 2022-06-21 (×2): qty 1

## 2022-06-21 MED ORDER — PHENOBARBITAL SODIUM 65 MG/ML IJ SOLN: 65.0000 mg | Freq: Every day | INTRAMUSCULAR | Status: DC

## 2022-06-21 MED ORDER — MAGNESIUM SULFATE 2 GM/50ML IV SOLN
2.0000 g | Freq: Once | INTRAVENOUS | Status: AC
  Administered 2022-06-21: 2 g via INTRAVENOUS
  Filled 2022-06-21: qty 50

## 2022-06-21 MED ORDER — PHENOBARBITAL SODIUM 65 MG/ML IJ SOLN: 65.0000 mg | Freq: Three times a day (TID) | INTRAMUSCULAR | Status: DC

## 2022-06-21 MED ORDER — CYANOCOBALAMIN 1000 MCG/ML IJ SOLN
1000.0000 ug | Freq: Once | INTRAMUSCULAR | Status: AC
  Administered 2022-06-21: 1000 ug via INTRAMUSCULAR
  Filled 2022-06-21: qty 1

## 2022-06-21 MED ORDER — PHENOBARBITAL SODIUM 130 MG/ML IJ SOLN
130.0000 mg | Freq: Three times a day (TID) | INTRAMUSCULAR | Status: AC
  Administered 2022-06-21 – 2022-06-22 (×3): 130 mg via INTRAVENOUS
  Filled 2022-06-21 (×3): qty 1

## 2022-06-21 MED ORDER — CHLORHEXIDINE GLUCONATE CLOTH 2 % EX PADS
6.0000 | MEDICATED_PAD | Freq: Every day | CUTANEOUS | Status: DC
  Administered 2022-06-21 – 2022-07-11 (×21): 6 via TOPICAL

## 2022-06-21 MED ORDER — PHENOBARBITAL SODIUM 65 MG/ML IJ SOLN: 32.5000 mg | Freq: Three times a day (TID) | INTRAMUSCULAR | Status: DC

## 2022-06-21 MED ORDER — IPRATROPIUM-ALBUTEROL 0.5-2.5 (3) MG/3ML IN SOLN
3.0000 mL | RESPIRATORY_TRACT | Status: DC
  Administered 2022-06-21 – 2022-07-05 (×78): 3 mL via RESPIRATORY_TRACT
  Filled 2022-06-21 (×82): qty 3

## 2022-06-21 MED ORDER — ALBUTEROL SULFATE (2.5 MG/3ML) 0.083% IN NEBU: 2.5000 mg | INHALATION_SOLUTION | Freq: Four times a day (QID) | RESPIRATORY_TRACT | Status: DC | PRN

## 2022-06-21 MED ORDER — PHENOBARBITAL 32.4 MG PO TABS: 32.4000 mg | ORAL_TABLET | Freq: Three times a day (TID) | ORAL | Status: DC

## 2022-06-21 MED ORDER — METHYLPREDNISOLONE SODIUM SUCC 40 MG IJ SOLR
40.0000 mg | Freq: Every day | INTRAMUSCULAR | Status: DC
  Administered 2022-06-21 – 2022-06-22 (×2): 40 mg via INTRAVENOUS
  Filled 2022-06-21 (×2): qty 1

## 2022-06-21 MED ORDER — PHENOBARBITAL SODIUM 65 MG/ML IJ SOLN
65.0000 mg | Freq: Three times a day (TID) | INTRAMUSCULAR | Status: DC
  Administered 2022-06-22: 65 mg via INTRAVENOUS
  Filled 2022-06-21: qty 1

## 2022-06-21 MED ORDER — LORAZEPAM 1 MG PO TABS: 2.0000 mg | ORAL_TABLET | ORAL | Status: DC | PRN

## 2022-06-21 MED ORDER — REVEFENACIN 175 MCG/3ML IN SOLN
175.0000 ug | Freq: Every day | RESPIRATORY_TRACT | Status: DC
  Administered 2022-06-21: 175 ug via RESPIRATORY_TRACT
  Filled 2022-06-21: qty 3

## 2022-06-21 MED ORDER — PHENOBARBITAL 32.4 MG PO TABS: 64.8000 mg | ORAL_TABLET | Freq: Three times a day (TID) | ORAL | Status: DC

## 2022-06-21 MED ORDER — DEXMEDETOMIDINE HCL IN NACL 400 MCG/100ML IV SOLN
0.0000 ug/kg/h | INTRAVENOUS | Status: DC
  Administered 2022-06-21: 0.08 ug/kg/h via INTRAVENOUS
  Administered 2022-06-22: 0.3 ug/kg/h via INTRAVENOUS
  Administered 2022-06-22: 0.5 ug/kg/h via INTRAVENOUS
  Administered 2022-06-23: 1.2 ug/kg/h via INTRAVENOUS
  Administered 2022-06-23: 0.5 ug/kg/h via INTRAVENOUS
  Administered 2022-06-23: 1.2 ug/kg/h via INTRAVENOUS
  Administered 2022-06-23 – 2022-06-25 (×6): 0.5 ug/kg/h via INTRAVENOUS
  Administered 2022-06-25: 0.3 ug/kg/h via INTRAVENOUS
  Filled 2022-06-21 (×12): qty 100

## 2022-06-21 MED ORDER — PHENOBARBITAL 32.4 MG PO TABS
97.2000 mg | ORAL_TABLET | Freq: Three times a day (TID) | ORAL | Status: DC
  Filled 2022-06-21: qty 3

## 2022-06-21 MED ORDER — LORAZEPAM 2 MG/ML PO CONC
0.5000 mg | Freq: Once | ORAL | Status: DC
  Filled 2022-06-21: qty 0.25

## 2022-06-21 MED ORDER — BUDESONIDE 0.5 MG/2ML IN SUSP
0.5000 mg | Freq: Two times a day (BID) | RESPIRATORY_TRACT | Status: DC
  Administered 2022-06-21 – 2022-06-23 (×5): 0.5 mg via RESPIRATORY_TRACT
  Filled 2022-06-21 (×5): qty 2

## 2022-06-21 MED ORDER — SODIUM CHLORIDE 0.9 % IV BOLUS
500.0000 mL | Freq: Once | INTRAVENOUS | Status: AC
  Administered 2022-06-22: 500 mL via INTRAVENOUS

## 2022-06-21 MED ORDER — METHYLPREDNISOLONE SODIUM SUCC 40 MG IJ SOLR: 40.0000 mg | Freq: Every day | INTRAMUSCULAR | Status: DC

## 2022-06-21 MED ORDER — ARFORMOTEROL TARTRATE 15 MCG/2ML IN NEBU
15.0000 ug | INHALATION_SOLUTION | Freq: Two times a day (BID) | RESPIRATORY_TRACT | Status: DC
  Administered 2022-06-21 – 2022-07-14 (×45): 15 ug via RESPIRATORY_TRACT
  Filled 2022-06-21 (×46): qty 2

## 2022-06-21 MED ORDER — SODIUM CHLORIDE 0.9 % IV SOLN
260.0000 mg | Freq: Once | INTRAVENOUS | Status: AC
  Administered 2022-06-21: 260 mg via INTRAVENOUS
  Filled 2022-06-21: qty 2

## 2022-06-21 MED ORDER — PHENOBARBITAL SODIUM 65 MG/ML IJ SOLN: 65.0000 mg | Freq: Once | INTRAMUSCULAR | Status: DC

## 2022-06-21 MED ORDER — LORAZEPAM 2 MG/ML IJ SOLN
1.0000 mg | INTRAMUSCULAR | Status: DC | PRN
  Administered 2022-06-21: 1 mg via INTRAVENOUS
  Filled 2022-06-21: qty 1

## 2022-06-21 NOTE — Progress Notes (Signed)
Pt transferred to [facility] w/ 6E Charge RN while on monitor and oxygen without issue.  Pt transferred to [facility] due to increased agitation and aggressive behavior towards staff requiring precedex and bipap.  This RN called family to notify of transfer w/ no answer to either contact number.

## 2022-06-21 NOTE — Consult Note (Signed)
NAME:  [name] Oneal, MRN:  [id number], DOB:  [birth year], LOS: 1 ADMISSION DATE:  06/20/2022, CONSULTATION DATE:  06/21/22 REFERRING MD:  Frann Rider, MD CHIEF COMPLAINT:  Alcohol Withdrawal   History of Present Illness:  [name] Oneal is a 77 year old male, daily smoker with CAD who was admitted 3/23 for chest pain noted to have an NSTEMI. 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 He has received 12mg  of IV ativan since night shift and remains very fidgety and restless. CIWA scores are 18-21.   PCCM consulted for concern of progressive alcohol withdrawal.   Pertinent  Medical History   Past Medical History:  Diagnosis Date   Alcoholic ([place])    Hypertension   CAD s/p DES in 2003 Tobacco Use  Significant Hospital Events: Including procedures, antibiotic start and stop dates in addition to other pertinent events   3/23 admitted for NSTEMI 3/24 PCCM consulted for alcohol withdrawal  Interim History / Subjective:   Spoke with nurse at bedside. Patient has pulled out 2 ivs. She is having to spend a lot of time in his room due to agitation.  Objective   Blood pressure (!) 190/70, pulse (!) 40, temperature 98.8 F (37.1 C), temperature source Oral, resp. rate (!) 22, height 5\' 10"  (1.778 m), weight 108.9 kg, SpO2 94 %.        Intake/Output Summary (Last 24 hours) at 06/21/2022 0124 Last data filed at 06/20/2022 2234 Gross per 24 hour  Intake 562.79 ml  Output 500 ml  Net 62.79 ml   Filed Weights   06/20/22 0239  Weight: 108.9 kg    Examination: General: elderly male, obese, no acute distress, laying in bed, intermittently awakes HENT: [name]/AT, moist mucous membranes, sclera anicteric Lungs: diffuse wheezing with poor air movement Cardiovascular: rrr, no murmurs Abdomen: soft, non-tender, non-distended, BS+ Extremities: warm, trace edema Neuro: not alert or oriented, awakes, moves all extremities  spontaneously GU: n/a  Resolved Hospital Problem list     Assessment & Plan:   Encephalopathy - in setting of alcohol withdrawal and B12 deficiency  Acute Alcohol Withdrawal - start phenobarbital protocol via IV, transition to PO when able - continue CIWA scoring - Continue Thiamine  Acute Hypoxemic Respiratory Failure 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 - Continue supplemental oxygen for SpO2 88% or greater - Start yupelri, brovana and budesonide nebs - PRN albuterol nebs - start 40mg  solumedrol daily, can transition to PO prednisone when able - monitor blood sugars  NSETMI - management per cardiology  B12 Deficiency Folate Deficiency - B12 IM injection ordered - continue folate 1mg  daily   Best Practice (right click and "Reselect all SmartList Selections" daily)   Per primary team  Labs   CBC: Recent Labs  Lab 06/20/22 0245 06/20/22 0250  WBC 13.1*  --   HGB 15.9 16.3  HCT 47.1 48.0  MCV 102.8*  --   PLT 242  --     Basic Metabolic Panel: Recent Labs  Lab 06/20/22 0245 06/20/22 0250  NA 136 138  K 3.6 3.7  CL 96*  --   CO2 27  --   GLUCOSE 115*  --   BUN 9  --   CREATININE 1.11  --   CALCIUM 8.7*  --    GFR: Estimated Creatinine Clearance: 71.1 mL/min (by C-G formula based on SCr of 1.11 mg/dL). Recent Labs  Lab 06/20/22 0245  WBC 13.1*  Liver Function Tests: Recent Labs  Lab 06/20/22 0245  AST 40  ALT 31  ALKPHOS 58  BILITOT 1.4*  PROT 6.5  ALBUMIN 3.6   No results for input(s): "LIPASE", "AMYLASE" in the last 168 hours. No results for input(s): "AMMONIA" in the last 168 hours.  ABG    Component Value Date/Time   HCO3 30.8 (H) 06/20/2022 0250   TCO2 32 06/20/2022 0250   O2SAT 49 06/20/2022 0250     Coagulation Profile: No results for input(s): "INR", "PROTIME" in the last 168 hours.  Cardiac Enzymes: No results for input(s): "CKTOTAL", "CKMB", "CKMBINDEX", "TROPONINI" in the last 168 hours.  HbA1C: Hgb A1c MFr Bld   Date/Time Value Ref Range Status  06/28/2008 04:15 AM  4.6 - 6.1 % Final   5.5 (NOTE)   The ADA recommends the following therapeutic goal for glycemic   control related to Hgb A1C measurement:   Goal of Therapy:   < 7.0% Hgb A1C   Reference: American Diabetes Association: Clinical Practice   Recommendations 2008, Diabetes Care,  2008, 31:(Suppl 1).    CBG: No results for input(s): "GLUCAP" in the last 168 hours.  Review of Systems:   Unable to perform due to patient's mental status  Past Medical History:  He,  has a past medical history of Alcoholic ([place]) and Hypertension.   Surgical History:  History reviewed. No pertinent surgical history.   Social History:   reports that he has been smoking. He has never used smokeless tobacco. He reports current alcohol use.   Family History:  His family history is not on file.   Allergies No Known Allergies   Home Medications  Prior to Admission medications   Not on File     Critical care time: n/a    Freda Jackson, MD [place] Office: [phone]   See Amion for personal pager PCCM on call pager [phone] until 7pm. Please call Elink 7p-7a. [phone]

## 2022-06-21 NOTE — Progress Notes (Signed)
Updated daughter via phone. Daughter reports that pt smokes 3 packs of cigarettes a day and drinks more than 20 mini liquor bottles daily.

## 2022-06-21 NOTE — Progress Notes (Signed)
Pt's significant other of 18 yrs, Melody at bedside and daughter, Cherre Huger, by phone updated and all questions answered as best able.  Confirmed pt's DNR/ DNI - as per patient's wishes.  Contact info updated in demographics.         Kennieth Rad, MSN, AG-ACNP-BC [facility] 06/21/2022, 7:01 PM  See Amion for pager If no response to pager, please call PCCM consult pager After 7:00 pm call Elink

## 2022-06-21 NOTE — Progress Notes (Signed)
[facility] Progress Note Patient Name: [name] Oneal DOB: [birth year] MRN: [id number]   Date of Service  06/21/2022  HPI/Events of Note  Notified by RN that patient became hypotensive with BP 90/43 MAP 57 after ativan.    Pt transferred to the ICU due to encephalopathy requiring precedex gtt, likely from alcohol withdrawal.  Pt also has ativan scheduled q4hrs.   eICU Interventions  Change ativan PRN as per CIWA protocol.  Continue precedex gtt.  Give 500cc NS bolus now.       Intervention Category Intermediate Interventions: Other:;Hypotension - evaluation and management  Elsie Lincoln 06/21/2022, 11:43 PM

## 2022-06-21 NOTE — Plan of Care (Signed)

## 2022-06-21 NOTE — Progress Notes (Addendum)
ANTICOAGULATION CONSULT NOTE - Follow Up Pharmacy Consult for Heparin Indication: chest pain/ACS/NSTEMI  No Known Allergies  Patient Measurements: Height: 5\' 10"  (177.8 cm) Weight: 108.9 kg (240 lb) IBW/kg (Calculated) : 73 Heparin Dosing Weight: 97 kg  Vital Signs: Temp: 98.6 F (37 C) (03/24 1420) Temp Source: Axillary (03/24 1420) BP: 124/73 (03/24 1500) Pulse Rate: 37 (03/24 1500)  Labs: Recent Labs    06/20/22 0245 06/20/22 0250 06/20/22 0430 06/20/22 1231 06/20/22 2144 06/21/22 0239 06/21/22 0540 06/21/22 1508  HGB 15.9 16.3  --   --   --  15.0  --   --   HCT 47.1 48.0  --   --   --  45.8  --   --   PLT 242  --   --   --   --  249  --   --   HEPARINUNFRC  --   --   --    < > 0.13*  --  0.64 0.67  CREATININE 1.11  --   --   --   --  1.02  --   --   TROPONINIHS 319*  --  379*  --   --   --   --   --    < > = values in this interval not displayed.     Estimated Creatinine Clearance: 77.4 mL/min (by C-G formula based on SCr of 1.02 mg/dL).   Assessment:  77 yr old male on IV heparin for ACS/NSTEMI.  Troponin 319. Not on anticoagulation prior to admission.  CBC normal.   Heparin level therapeutic (0.67) on infusion at 1850 units/hr. No bleeding noted.  Goal of Therapy:  Heparin level 0.3-0.7 units/ml Monitor platelets by anticoagulation protocol: Yes   Plan:  Continue heparin 1850 units/hr Daily heparin level and CBC  Sherlon Handing, PharmD, BCPS Please see amion for complete clinical pharmacist phone list 06/21/2022 4:10 PM

## 2022-06-21 NOTE — Progress Notes (Addendum)
ANTICOAGULATION CONSULT NOTE - Follow Up Pharmacy Consult for Heparin Indication: chest pain/ACS/NSTEMI  No Known Allergies  Patient Measurements: Height: 5\' 10"  (177.8 cm) Weight: 108.9 kg (240 lb) IBW/kg (Calculated) : 73 Heparin Dosing Weight: 97 kg  Vital Signs: Temp: 98.1 F (36.7 C) (03/24 0400) Temp Source: Axillary (03/24 0400) BP: 165/94 (03/24 0400) Pulse Rate: 73 (03/24 0400)  Labs: Recent Labs    06/20/22 0245 06/20/22 0250 06/20/22 0430 06/20/22 1231 06/20/22 2144 06/21/22 0239 06/21/22 0540  HGB 15.9 16.3  --   --   --  15.0  --   HCT 47.1 48.0  --   --   --  45.8  --   PLT 242  --   --   --   --  249  --   HEPARINUNFRC  --   --   --  0.19* 0.13*  --  0.64  CREATININE 1.11  --   --   --   --  1.02  --   TROPONINIHS 319*  --  379*  --   --   --   --      Estimated Creatinine Clearance: 77.4 mL/min (by C-G formula based on SCr of 1.02 mg/dL).   Assessment:  77 yr old male to begin IV heparin for ACS/NSTEMI.  Troponin 319. Not on anticoagulation prior to admission.  CBC normal.   Heparin level down further to 0.13 on infusion at 1550 units/hr. No bleeding per RN. RN notes that there was an IV infiltration ~1900 which caused small hematoma - bleeding stopped after pressure applied.  3/24 AM update:  Heparin level therapeutic after rate increase  Goal of Therapy:  Heparin level 0.3-0.7 units/ml Monitor platelets by anticoagulation protocol: Yes   Plan:  Cont heparin 1850 units/hr 1400 heparin level  Narda Bonds, PharmD, BCPS Clinical Pharmacist Phone: [phone]

## 2022-06-21 NOTE — Progress Notes (Signed)
NAME:  [name] Oneal, MRN:  [id number], DOB:  Dec 19, 1945, LOS: 1 ADMISSION DATE:  06/20/2022, CONSULTATION DATE:  06/21/22 REFERRING MD:  Frann Rider, MD CHIEF COMPLAINT:  Alcohol Withdrawal   History of Present Illness:  [name] Oneal is a 77 year old male, daily smoker with CAD who was admitted 3/23 for chest pain noted to have an NSTEMI. 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 He has received 12mg  of IV ativan since night shift and remains very fidgety and restless. CIWA scores are 18-21.   PCCM consulted for concern of progressive alcohol withdrawal.   Pertinent  Medical History   Past Medical History:  Diagnosis Date   Alcoholic ([place])    Hypertension   CAD s/p DES in 2003 Tobacco Use  Significant Hospital Events: Including procedures, antibiotic start and stop dates in addition to other pertinent events   3/23 admitted for NSTEMI 3/24 PCCM consulted for alcohol withdrawal  Interim History / Subjective:  RN reports worsening agitation in between phenobarb doses, kicking and biting IV lines/ O2 tubing  Objective   Blood pressure (!) 161/122, pulse 84, temperature 97.9 F (36.6 C), temperature source Oral, resp. rate (!) 21, height 5\' 10"  (1.778 m), weight 108.9 kg, SpO2 96 %.        Intake/Output Summary (Last 24 hours) at 06/21/2022 1410 Last data filed at 06/20/2022 2234 Gross per 24 hour  Intake 562.79 ml  Output 500 ml  Net 62.79 ml   Filed Weights   06/20/22 0239  Weight: 108.9 kg    Examination: General:  Obese, elderly male lying in bed, intermittently restless, confused, agitated then falls asleep, Oriented to self.  MAE HEENT: MM pink/dry, pupils 3/reactive, anicteric  CV: rr, NSR, no murmur PULM:  pursed lip breathing, diminished/ poor air movement, no wheeze GI: protuberant, soft, +bs, NT, condom cath> bladder scan< 200  Extremities: warm/dry, trace LE edema  Skin: no rashes     Resolved Hospital Problem list     Assessment & Plan:  Acute Alcohol Withdrawal ETOH abuse  - transferring to ICU for BiPAP and precedex gtt - continue phenobarbital taper, will give additional bolus to minimize drip - CIWA - cont thiamine, folate, MVI - supportive care as below, no s/s of infectious component - cessation counseling when appropriate   Acute Hypoxemic Respiratory Failure 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 Tobacco abuse  ?OSA - ICU monitoring on BiPAP given encephalopathy and to prevent further respiratory fatigue - VBG - NPO, aspiration precautions  - remains a DNR/ DNI  - wean supplemental oxygen for SpO2 88% or greater - duonebs for today> then likely switch back, continue brovana/ pulmicort, prn albuterol  - cont solumedrol 40mg  daily  - would benefit from outpt sleep study and weight loss  - smoking cessation  Encephalopathy - in setting of alcohol withdrawal and B12 deficiency - supportive care as above  - no signs of infectious etiology, monitor   NSETMI/ angina CAD HTN - management per cardiology.  Plans for [facility] 3/24 AM.   - heparin per pharmacy   B12 Deficiency Folate Deficiency - B12 IM injection ordered - continue folate 1mg  daily   Best Practice (right click and "Reselect all SmartList Selections" daily)   Diet/type: NPO DVT prophylaxis: systemic heparin GI prophylaxis: N/A Lines: N/A Foley:  N/A Code Status:  DNR Last date of multidisciplinary goals of care discussion [3/24]  Jeanie Cooks, daughter, [phone], called,  no answer, no voicemail Melody Ward- listed [phone] called> not the right number which was removed   Labs   CBC: Recent Labs  Lab 06/20/22 0245 06/20/22 0250 06/21/22 0239  WBC 13.1*  --  13.1*  HGB 15.9 16.3 15.0  HCT 47.1 48.0 45.8  MCV 102.8*  --  104.6*  PLT 242  --  0000000    Basic Metabolic Panel: Recent Labs  Lab 06/20/22 0245 06/20/22 0250 06/21/22 0239  NA 136 138 138  K 3.6 3.7 3.9   CL 96*  --  103  CO2 27  --  28  GLUCOSE 115*  --  103*  BUN 9  --  10  CREATININE 1.11  --  1.02  CALCIUM 8.7*  --  8.5*  MG  --   --  2.0   GFR: Estimated Creatinine Clearance: 77.4 mL/min (by C-G formula based on SCr of 1.02 mg/dL). Recent Labs  Lab 06/20/22 0245 06/21/22 0239  WBC 13.1* 13.1*    Liver Function Tests: Recent Labs  Lab 06/20/22 0245  AST 40  ALT 31  ALKPHOS 58  BILITOT 1.4*  PROT 6.5  ALBUMIN 3.6   No results for input(s): "LIPASE", "AMYLASE" in the last 168 hours. No results for input(s): "AMMONIA" in the last 168 hours.  ABG    Component Value Date/Time   HCO3 30.8 (H) 06/20/2022 0250   TCO2 32 06/20/2022 0250   O2SAT 49 06/20/2022 0250     Coagulation Profile: No results for input(s): "INR", "PROTIME" in the last 168 hours.  Cardiac Enzymes: No results for input(s): "CKTOTAL", "CKMB", "CKMBINDEX", "TROPONINI" in the last 168 hours.  HbA1C: Hgb A1c MFr Bld  Date/Time Value Ref Range Status  06/28/2008 04:15 AM  4.6 - 6.1 % Final   5.5 (NOTE)   The ADA recommends the following therapeutic goal for glycemic   control related to Hgb A1C measurement:   Goal of Therapy:   < 7.0% Hgb A1C   Reference: American Diabetes Association: Clinical Practice   Recommendations 2008, Diabetes Care,  2008, 31:(Suppl 1).    CBG: No results for input(s): "GLUCAP" in the last 168 hours.   Critical care time: 72 mins     Kennieth Rad, MSN, AG-ACNP-BC [facility] 06/21/2022, 2:10 PM  See Amion for pager If no response to pager, please call PCCM consult pager After 7:00 pm call [place]

## 2022-06-21 NOTE — Progress Notes (Signed)
Cardiologist:  Hochrein  Subjective:  Sedated from having DTls last night   Objective:  Vitals:   06/21/22 0005 06/21/22 0007 06/21/22 0258 06/21/22 0400  BP:  (!) 190/70  (!) 165/94  Pulse:  (!) 40  73  Resp:  (!) 22  (!) 22  Temp: 98.8 F (37.1 C)   98.1 F (36.7 C)  TempSrc: Oral Oral  Axillary  SpO2: 94% 94% 95% 97%  Weight:      Height:        Intake/Output from previous day:  Intake/Output Summary (Last 24 hours) at 06/21/2022 0757 Last data filed at 06/20/2022 2234 Gross per 24 hour  Intake 562.79 ml  Output 500 ml  Net 62.79 ml    Physical Exam: Affect appropriate Healthy:  appears stated age HEENT: normal Neck supple with no adenopathy JVP normal no bruits no thyromegaly Lungs rhonchi and exp wheezing  Heart:  S1/S2 no murmur, no rub, gallop or click PMI normal Abdomen: benighn, BS positve, no tenderness, no AAA no bruit.  No HSM or HJR Distal pulses intact with no bruits No edema Neuro non-focal Skin warm and dry No muscular weakness   Lab Results: Basic Metabolic Panel: Recent Labs    06/20/22 0245 06/20/22 0250 06/21/22 0239  NA 136 138 138  K 3.6 3.7 3.9  CL 96*  --  103  CO2 27  --  28  GLUCOSE 115*  --  103*  BUN 9  --  10  CREATININE 1.11  --  1.02  CALCIUM 8.7*  --  8.5*  MG  --   --  2.0   Liver Function Tests: Recent Labs    06/20/22 0245  AST 40  ALT 31  ALKPHOS 58  BILITOT 1.4*  PROT 6.5  ALBUMIN 3.6   No results for input(s): "LIPASE", "AMYLASE" in the last 72 hours. CBC: Recent Labs    06/20/22 0245 06/20/22 0250 06/21/22 0239  WBC 13.1*  --  13.1*  HGB 15.9 16.3 15.0  HCT 47.1 48.0 45.8  MCV 102.8*  --  104.6*  PLT 242  --  249    Anemia Panel: Recent Labs    06/20/22 0545  VITAMINB12 130*  FOLATE 3.1*    Imaging: DG Chest 2 View  Result Date: 06/20/2022 CLINICAL DATA:  Chest pain EXAM: CHEST - 2 VIEW COMPARISON:  09/18/2014 FINDINGS: Mild cardiac enlargement. No vascular congestion or  edema. Mild emphysematous changes in the lungs with peribronchial thickening and scattered interstitial changes, likely chronic bronchitis. Calcified granulomas on the left. No focal consolidation. No pleural effusions. No pneumothorax. Mediastinal contours appear intact. Degenerative changes in the spine and shoulders. IMPRESSION: Emphysematous and chronic bronchitic changes in the lungs. Scattered calcified granulomas. No focal consolidation. Electronically Signed   By: Lucienne Capers M.D.   On: 06/20/2022 03:58    Cardiac Studies:  ECG: SR RBBB PAC inferior lateral ST changes    Telemetry:  NSR  Echo: pending  Medications:    arformoterol  15 mcg Nebulization BID   aspirin  81 mg Oral Pre-Cath   budesonide (PULMICORT) nebulizer solution  0.5 mg Nebulization BID   folic acid  1 mg Oral Daily   lisinopril  20 mg Oral Daily   methylPREDNISolone (SOLU-MEDROL) injection  40 mg Intravenous Daily   metoprolol tartrate  25 mg Oral Q6H   multivitamin with minerals  1 tablet Oral Daily   PHENObarbital  97.5 mg Intravenous Q8H   Followed by   [  START ON 06/23/2022] PHENObarbital  65 mg Intravenous Q8H   Followed by   Derrill Memo ON 06/25/2022] PHENObarbital  32.5 mg Intravenous Q8H   revefenacin  175 mcg Nebulization Daily   sodium chloride flush  3 mL Intravenous Q12H   thiamine  100 mg Oral Daily   Or   thiamine  100 mg Intravenous Daily      sodium chloride     sodium chloride     heparin 1,850 Units/hr (06/20/22 2234)    Assessment/Plan:   Angina:  presumably for cath in am per Dr Percival Spanish continue heparin and beta blocker Peak troponin 379 with RBBB and inferior lateral ST changes  ETOH Abuse Withdrawal protocol has thiamine and librium ordered  COPD:  current smoker continue solumedrol and inhalers CXR yesterday with emphysema and chronic bronchitic chagnes  Jenkins Rouge 06/21/2022, 7:57 AM

## 2022-06-21 NOTE — Progress Notes (Signed)
  Cardiology update: Paged by RN earlier today given concern for alcohol withdrawal.  He was placed on CIWA protocol given that patient stated that he drinks approximately 20 of the small airplane bottles of alcohol per day and his last drink was approximately 36 hours ago. Patient started having significant withdrawal symptoms tonight and has received a total of 10 mg of Ativan since 8:30 p.m. I was called to bedside given agitation.  Patient also pulled his peripheral IVs and is disoriented with presence of hallucinations. Upon my evaluation his CIWA was 37.  He will receive another dose of Ativan now but I anticipate his alcohol withdrawal will only get worse.  Will consult ICU.

## 2022-06-21 NOTE — Progress Notes (Signed)
   06/20/22 2005 06/21/22 0007  Provider Notification  Provider Name/Title On call Whiting MD  Date Provider Notified 06/20/22 06/21/22  Time Provider Notified 2006 0013  Method of Notification Page Page  Notification Reason Other (Comment) (Request for CIWA PRN) Other (Comment) (BP, ativan, RR notified)  Provider response  --  At bedside

## 2022-06-21 NOTE — Progress Notes (Signed)
Pt desat into the 70's, pulling off nasal cannula. O2 sats adequate in 90's with 2 L [place].   Cardiology & PCCM at the bedside

## 2022-06-22 ENCOUNTER — Encounter (HOSPITAL_COMMUNITY)
Admission: EM | Disposition: A | Discharge status: Skilled Nursing Facility | Payer: Self-pay | Source: Home / Self Care | Attending: Critical Care Medicine

## 2022-06-22 DIAGNOSIS — G9341 Metabolic encephalopathy: Secondary | ICD-10-CM | POA: Diagnosis not present

## 2022-06-22 DIAGNOSIS — J441 Chronic obstructive pulmonary disease with (acute) exacerbation: Secondary | ICD-10-CM

## 2022-06-22 DIAGNOSIS — J9601 Acute respiratory failure with hypoxia: Secondary | ICD-10-CM | POA: Diagnosis not present

## 2022-06-22 DIAGNOSIS — I214 Non-ST elevation (NSTEMI) myocardial infarction: Secondary | ICD-10-CM | POA: Diagnosis not present

## 2022-06-22 DIAGNOSIS — I200 Unstable angina: Secondary | ICD-10-CM | POA: Diagnosis not present

## 2022-06-22 DIAGNOSIS — J9602 Acute respiratory failure with hypercapnia: Secondary | ICD-10-CM

## 2022-06-22 HISTORY — DX: Acute respiratory failure with hypoxia: J96.01

## 2022-06-22 HISTORY — DX: Non-ST elevation (NSTEMI) myocardial infarction: I21.4

## 2022-06-22 LAB — CBC
HCT: 42.4 % (ref 39.0–52.0)
Hemoglobin: 14.1 g/dL (ref 13.0–17.0)
MCH: 34.6 pg — ABNORMAL HIGH (ref 26.0–34.0)
MCHC: 33.3 g/dL (ref 30.0–36.0)
MCV: 103.9 fL — ABNORMAL HIGH (ref 80.0–100.0)
Platelets: 192 10*3/uL (ref 150–400)
RBC: 4.08 MIL/uL — ABNORMAL LOW (ref 4.22–5.81)
RDW: 14.5 % (ref 11.5–15.5)
WBC: 11.5 10*3/uL — ABNORMAL HIGH (ref 4.0–10.5)
nRBC: 0 % (ref 0.0–0.2)

## 2022-06-22 LAB — GLUCOSE, CAPILLARY
Glucose-Capillary: 117 mg/dL — ABNORMAL HIGH (ref 70–99)
Glucose-Capillary: 123 mg/dL — ABNORMAL HIGH (ref 70–99)
Glucose-Capillary: 134 mg/dL — ABNORMAL HIGH (ref 70–99)
Glucose-Capillary: 178 mg/dL — ABNORMAL HIGH (ref 70–99)
Glucose-Capillary: 158 mg/dL — ABNORMAL HIGH (ref 70–99)
Glucose-Capillary: 157 mg/dL — ABNORMAL HIGH (ref 70–99)

## 2022-06-22 LAB — PROTIME-INR
INR: 1.2 (ref 0.8–1.2)
Prothrombin Time: 14.8 seconds (ref 11.4–15.2)

## 2022-06-22 LAB — BASIC METABOLIC PANEL
Anion gap: 15 (ref 5–15)
BUN: 18 mg/dL (ref 8–23)
CO2: 24 mmol/L (ref 22–32)
Calcium: 8.5 mg/dL — ABNORMAL LOW (ref 8.9–10.3)
Chloride: 99 mmol/L (ref 98–111)
Creatinine, Ser: 1.04 mg/dL (ref 0.61–1.24)
GFR, Estimated: >60 mL/min (ref >60)
Glucose, Bld: 120 mg/dL — ABNORMAL HIGH (ref 70–99)
Potassium: 4.2 mmol/L (ref 3.5–5.1)
Sodium: 138 mmol/L (ref 135–145)

## 2022-06-22 LAB — HEPARIN LEVEL (UNFRACTIONATED): Heparin Unfractionated: 1.1 IU/mL — ABNORMAL HIGH (ref 0.30–0.70)

## 2022-06-22 LAB — MAGNESIUM: Magnesium: 2.3 mg/dL (ref 1.7–2.4)

## 2022-06-22 SURGERY — LEFT HEART CATH AND CORONARY ANGIOGRAPHY
Anesthesia: LOCAL

## 2022-06-22 MED ORDER — FOLIC ACID 5 MG/ML IJ SOLN
1.0000 mg | Freq: Once | INTRAMUSCULAR | Status: AC
  Administered 2022-06-22: 1 mg via INTRAVENOUS
  Filled 2022-06-22: qty 0.2

## 2022-06-22 NOTE — Progress Notes (Signed)
   Patient Name: [name] Oneal Date of Encounter: 06/22/2022 [place] Cardiologist: Minus Breeding, MD   Interval Summary   EtOH withdrawal.  Transferred to ICU with AMs and agitation.    Vital Signs   Vitals:   06/22/22 0744 06/22/22 0800 06/22/22 0813 06/22/22 0900  BP:  115/70  119/63  Pulse:  (!) 47 (!) 53 (!) 49  Resp:  (!) 24 20 18   Temp: 97.7 F (36.5 C)     TempSrc: Oral     SpO2:  97% 96% 95%  Weight:      Height:        Intake/Output Summary (Last 24 hours) at 06/22/2022 0950 Last data filed at 06/22/2022 0900 Gross per 24 hour  Intake 976.37 ml  Output --  Net 976.37 ml      06/22/2022    6:00 AM 06/20/2022    2:39 AM 01/23/2016    9:19 PM  Last 3 Weights  Weight (lbs) 232 lb 2.3 oz 240 lb 233 lb 4 oz  Weight (kg) 105.3 kg 108.863 kg 105.802 kg      Telemetry/ECG    Sinus with PVCs - Personally Reviewed  Physical Exam  GEN: No acute distress.   Neck: No JVD Cardiac: RRR, no murmurs, rubs, or gallops.  Respiratory: Clear to auscultation bilaterally. GI: Soft, nontender, non-distended  MS: No edema  Assessment & Plan    Unstable angina:  Was for cath .  Continue heparin.   Cath cancelled secondary to ETOH withdrawal.  Patient is not able to cooperate with invasive study at this time.  Unable to take PO meds.  Daughter expressed concern about rectal bleeding when PR ASA was mentioned.  Will hold off and wait until the patient is able to take POs.  We will follow for timing of cath.    ETOH:  We appreciate CCM managing this.    DNR/DNI:  Patient had agreed to suspend this for the cath but was very clear about not wanting resuscitation/intubation otherwise.    For questions or updates, please contact [place] Please consult [email or web address] for contact info under        Signed, Minus Breeding, MD

## 2022-06-22 NOTE — Progress Notes (Signed)
NAME:  Dakoata Dimitrov, MRN:  [id number], DOB:  [birth year], LOS: 2 ADMISSION DATE:  06/20/2022, CONSULTATION DATE:  06/21/22 REFERRING MD:  Frann Rider, MD CHIEF COMPLAINT:  Alcohol Withdrawal   History of Present Illness:  Kapil Sarra is a 77 year old male, daily smoker with CAD who was admitted 3/23 for chest pain noted to have an NSTEMI. 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 He has received 12mg  of IV ativan since night shift and remains very fidgety and restless. CIWA scores are 18-21.   PCCM consulted for concern of progressive alcohol withdrawal.   Pertinent  Medical History   Past Medical History:  Diagnosis Date   Alcoholic ([place])    Hypertension   CAD s/p DES in 2003 Tobacco Use  Significant Hospital Events: Including procedures, antibiotic start and stop dates in addition to other pertinent events   3/23 admitted for NSTEMI 3/24 PCCM consulted for alcohol withdrawal  Interim History / Subjective:  Remains on BiPAP and hasn't had break yet. Precedex off since this AM as he has been quite somnolent. Does get agitated with stimulation and starts "swinging" (personally witnessed during my exam).  Objective   Blood pressure 119/63, pulse (!) 49, temperature 97.7 F (36.5 C), temperature source Oral, resp. rate 18, height 5\' 10"  (1.778 m), weight 105.3 kg, SpO2 95 %.    Vent Mode: BIPAP;PCV FiO2 (%):  [40 %-50 %] 50 % Set Rate:  [12 bmp] 12 bmp PEEP:  [5 cmH20] 5 cmH20   Intake/Output Summary (Last 24 hours) at 06/22/2022 1106 Last data filed at 06/22/2022 0900 Gross per 24 hour  Intake 976.37 ml  Output --  Net 976.37 ml    Filed Weights   06/20/22 0239 06/22/22 0600  Weight: 108.9 kg 105.3 kg    Examination: General: Adult male, resting in bed, in NAD. Neuro: Somnolent, gets agitated with any stimulation but does quickly fall back asleep. HEENT: [place]/AT. Sclerae anicteric. MM  moist. Cardiovascular: RRR, no M/R/G.  Lungs: Respirations even and unlabored.  CTA bilaterally, No W/R/R. Abdomen: BS x 4, soft, NT/ND.  Musculoskeletal: No gross deformities, no edema.  Skin: Intact, warm, no rashes.  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 Tobacco abuse  ?OSA - BiPAP PRN and with sleep/naps - Remove BiPAP now and provide a break, likely can tolerated throughout the day - remains a DNR/ DNI  - wean supplemental oxygen for SpO2 88% or greater - continue BD"s - cont solumedrol 40mg  daily  - would benefit from outpt sleep study and weight loss  - smoking cessation  Encephalopathy - in setting of alcohol withdrawal and B12 deficiency - supportive care as above  - no signs of infectious etiology, monitor   NSETMI/ angina, CAD, HTN - management per cardiology, plans for LHC once over withdrawal and can safely perform - heparin per pharmacy     Best Practice (right click and "Reselect all SmartList Selections" daily)   Diet/type: NPO DVT prophylaxis: systemic heparin GI prophylaxis: N/A Lines: N/A Foley:  N/A Code Status:  DNR Last date of multidisciplinary  goals of care discussion [3/24]  Jeanie Cooks, daughter, [phone], called, no answer, no voicemail Melody Ward- listed [phone] called> not the right number which was removed    Critical care time: 35 mins    Montey Hora, [place] - [facility] [place] For pager details, please see AMION or use Epic chat  After 1900, please call [facility] for cross coverage needs 06/22/2022, 11:18 AM

## 2022-06-22 NOTE — Progress Notes (Signed)
An USGPIV (ultrasound guided PIV) has been placed for short-term vasopressor infusion. A correctly placed ivWatch must be used when administering Vasopressors. Should this treatment be needed beyond 72 hours, central line access should be obtained.  It will be the responsibility of the bedside nurse to follow best practice to prevent extravasations.   ?

## 2022-06-22 NOTE — Progress Notes (Signed)
ANTICOAGULATION CONSULT NOTE  Pharmacy Consult for Heparin Indication: chest pain/ACS/NSTEMI Brief A/P: Heparin level supratherapeutic Decrease Heparin rate  No Known Allergies  Patient Measurements: Height: 5\' 10"  (177.8 cm) Weight: 108.9 kg (240 lb) IBW/kg (Calculated) : 73 Heparin Dosing Weight: 97 kg  Vital Signs: Temp: 97.6 F (36.4 C) (03/24 2330) Temp Source: Axillary (03/24 2330) BP: 133/76 (03/25 0300) Pulse Rate: 56 (03/25 0322)  Labs: Recent Labs    06/20/22 0245 06/20/22 0250 06/20/22 0430 06/20/22 1231 06/21/22 0239 06/21/22 0540 06/21/22 1508 06/22/22 0442  HGB 15.9 16.3  --   --  15.0  --   --  14.1  HCT 47.1 48.0  --   --  45.8  --   --  42.4  PLT 242  --   --   --  249  --   --  192  LABPROT  --   --   --   --   --   --   --  14.8  INR  --   --   --   --   --   --   --  1.2  HEPARINUNFRC  --   --   --    < >  --  0.64 0.67 1.10*  CREATININE 1.11  --   --   --  1.02  --   --   --   TROPONINIHS 319*  --  379*  --   --   --   --   --    < > = values in this interval not displayed.     Estimated Creatinine Clearance: 77.4 mL/min (by C-G formula based on SCr of 1.02 mg/dL).   Assessment: 77 y.o. male with chest pain for heparin   Goal of Therapy:  Heparin level 0.3-0.7 units/ml Monitor platelets by anticoagulation protocol: Yes   Plan:  Decrease Heparin 1600 units/hr Follow up after cath today   Phillis Knack, PharmD, BCPS  06/22/2022 5:46 AM

## 2022-06-22 NOTE — Progress Notes (Signed)
CSW received consult for substance use resources. CSW following to speak with patient when appropriate.

## 2022-06-23 DIAGNOSIS — F10931 Alcohol use, unspecified with withdrawal delirium: Secondary | ICD-10-CM

## 2022-06-23 DIAGNOSIS — R739 Hyperglycemia, unspecified: Secondary | ICD-10-CM | POA: Diagnosis not present

## 2022-06-23 DIAGNOSIS — I200 Unstable angina: Secondary | ICD-10-CM | POA: Diagnosis not present

## 2022-06-23 DIAGNOSIS — I214 Non-ST elevation (NSTEMI) myocardial infarction: Secondary | ICD-10-CM | POA: Diagnosis not present

## 2022-06-23 HISTORY — DX: Alcohol use, unspecified with withdrawal delirium: F10.931

## 2022-06-23 LAB — BASIC METABOLIC PANEL WITH GFR
Anion gap: 9 (ref 5–15)
BUN: 22 mg/dL (ref 8–23)
CO2: 28 mmol/L (ref 22–32)
Calcium: 8.5 mg/dL — ABNORMAL LOW (ref 8.9–10.3)
Chloride: 101 mmol/L (ref 98–111)
Creatinine, Ser: 1.05 mg/dL (ref 0.61–1.24)
GFR, Estimated: >60 mL/min
Glucose, Bld: 135 mg/dL — ABNORMAL HIGH (ref 70–99)
Potassium: 4.8 mmol/L (ref 3.5–5.1)
Sodium: 138 mmol/L (ref 135–145)

## 2022-06-23 LAB — LIPID PANEL
Cholesterol: 187 mg/dL (ref 0–200)
HDL: 62 mg/dL (ref >40)
LDL Cholesterol: 106 mg/dL — ABNORMAL HIGH (ref 0–99)
Total CHOL/HDL Ratio: 3 RATIO
Triglycerides: 93 mg/dL (ref <150)
VLDL: 19 mg/dL (ref 0–40)

## 2022-06-23 LAB — CBC
HCT: 45 % (ref 39.0–52.0)
Hemoglobin: 14.6 g/dL (ref 13.0–17.0)
MCH: 34.7 pg — ABNORMAL HIGH (ref 26.0–34.0)
MCHC: 32.4 g/dL (ref 30.0–36.0)
MCV: 106.9 fL — ABNORMAL HIGH (ref 80.0–100.0)
Platelets: 181 10*3/uL (ref 150–400)
RBC: 4.21 MIL/uL — ABNORMAL LOW (ref 4.22–5.81)
RDW: 14.6 % (ref 11.5–15.5)
WBC: 13.2 10*3/uL — ABNORMAL HIGH (ref 4.0–10.5)
nRBC: 0 % (ref 0.0–0.2)

## 2022-06-23 LAB — GLUCOSE, CAPILLARY
Glucose-Capillary: 155 mg/dL — ABNORMAL HIGH (ref 70–99)
Glucose-Capillary: 131 mg/dL — ABNORMAL HIGH (ref 70–99)
Glucose-Capillary: 118 mg/dL — ABNORMAL HIGH (ref 70–99)
Glucose-Capillary: 125 mg/dL — ABNORMAL HIGH (ref 70–99)
Glucose-Capillary: 112 mg/dL — ABNORMAL HIGH (ref 70–99)

## 2022-06-23 LAB — HEPARIN LEVEL (UNFRACTIONATED): Heparin Unfractionated: 0.74 [IU]/mL — ABNORMAL HIGH (ref 0.30–0.70)

## 2022-06-23 LAB — MAGNESIUM: Magnesium: 2.4 mg/dL (ref 1.7–2.4)

## 2022-06-23 MED ORDER — PHENOBARBITAL SODIUM 130 MG/ML IJ SOLN
130.0000 mg | Freq: Three times a day (TID) | INTRAMUSCULAR | Status: AC
  Administered 2022-06-23 (×3): 130 mg via INTRAVENOUS
  Filled 2022-06-23 (×3): qty 1

## 2022-06-23 MED ORDER — LOPERAMIDE HCL 2 MG PO CAPS: 2.0000 mg | ORAL_CAPSULE | ORAL | Status: DC | PRN

## 2022-06-23 MED ORDER — ONDANSETRON 4 MG PO TBDP: 4.0000 mg | ORAL_TABLET | Freq: Four times a day (QID) | ORAL | Status: DC | PRN

## 2022-06-23 MED ORDER — SODIUM CHLORIDE 0.9 % IV SOLN
1.0000 mg | Freq: Every day | INTRAVENOUS | Status: DC
  Administered 2022-06-23: 1 mg via INTRAVENOUS
  Filled 2022-06-23 (×2): qty 0.2

## 2022-06-23 MED ORDER — REVEFENACIN 175 MCG/3ML IN SOLN
175.0000 ug | Freq: Every day | RESPIRATORY_TRACT | Status: DC
  Administered 2022-06-23 – 2022-07-14 (×22): 175 ug via RESPIRATORY_TRACT
  Filled 2022-06-23 (×22): qty 3

## 2022-06-23 MED ORDER — ORAL CARE MOUTH RINSE
15.0000 mL | OROMUCOSAL | Status: DC
  Administered 2022-06-23 – 2022-07-13 (×69): 15 mL via OROMUCOSAL

## 2022-06-23 MED ORDER — PHENOBARBITAL SODIUM 65 MG/ML IJ SOLN: 65.0000 mg | Freq: Three times a day (TID) | INTRAMUSCULAR | Status: DC

## 2022-06-23 MED ORDER — ORAL CARE MOUTH RINSE: 15.0000 mL | OROMUCOSAL | Status: DC | PRN

## 2022-06-23 MED ORDER — LORAZEPAM 2 MG/ML IJ SOLN
1.0000 mg | INTRAMUSCULAR | Status: DC | PRN
  Administered 2022-06-23 – 2022-06-24 (×7): 2 mg via INTRAVENOUS
  Filled 2022-06-23 (×7): qty 1

## 2022-06-23 MED ORDER — VALPROATE SODIUM 100 MG/ML IV SOLN
250.0000 mg | Freq: Four times a day (QID) | INTRAVENOUS | Status: DC
  Administered 2022-06-23 – 2022-06-25 (×10): 250 mg via INTRAVENOUS
  Filled 2022-06-23 (×13): qty 2.5

## 2022-06-23 MED ORDER — CHLORDIAZEPOXIDE HCL 5 MG PO CAPS: 25.0000 mg | ORAL_CAPSULE | Freq: Four times a day (QID) | ORAL | Status: DC | PRN

## 2022-06-23 MED ORDER — METOPROLOL TARTRATE 5 MG/5ML IV SOLN
2.5000 mg | Freq: Four times a day (QID) | INTRAVENOUS | Status: DC
  Administered 2022-06-23 – 2022-06-24 (×5): 2.5 mg via INTRAVENOUS
  Filled 2022-06-23 (×5): qty 5

## 2022-06-23 MED ORDER — FOLIC ACID 5 MG/ML IJ SOLN
1.0000 mg | Freq: Every day | INTRAMUSCULAR | Status: DC
  Administered 2022-06-24: 1 mg via INTRAVENOUS
  Filled 2022-06-23: qty 0.2

## 2022-06-23 MED ORDER — CHLORDIAZEPOXIDE HCL 5 MG PO CAPS: 25.0000 mg | ORAL_CAPSULE | Freq: Four times a day (QID) | ORAL | Status: DC

## 2022-06-23 NOTE — Progress Notes (Addendum)
NAME:  [name] Oneal, MRN:  [id number], DOB:  [birth year], LOS: 3 ADMISSION DATE:  06/20/2022, CONSULTATION DATE:  06/21/22 REFERRING MD:  Frann Rider, MD CHIEF COMPLAINT:  Alcohol Withdrawal   History of Present Illness:  [name] Oneal is a 77 year old male, daily smoker with CAD who was admitted 3/23 for chest pain noted to have an NSTEMI. 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 He has received 12mg  of IV ativan since night shift and remains very fidgety and restless. CIWA scores are 18-21.   PCCM consulted for concern of progressive alcohol withdrawal.   Pertinent  Medical History   Past Medical History:  Diagnosis Date   Alcoholic ([place])    Hypertension   CAD s/p DES in 2003 Tobacco Use  Significant Hospital Events: Including procedures, antibiotic start and stop dates in addition to other pertinent events   3/23 admitted for NSTEMI 3/24 PCCM consulted for alcohol withdrawal  Interim History / Subjective:  Intermittent agitation requiring intermittent dosing Ativan roughly q3hrs On 0.4 Precedex.  Objective   Blood pressure (!) 145/81, pulse 68, temperature (!) 97.1 F (36.2 C), temperature source Oral, resp. rate 15, height 5\' 10"  (1.778 m), weight 105.3 kg, SpO2 96 %.    FiO2 (%):  [50 %] 50 %   Intake/Output Summary (Last 24 hours) at 06/23/2022 0907 Last data filed at 06/23/2022 [id number] Gross per 24 hour  Intake 841.41 ml  Output 1110 ml  Net -268.59 ml    Filed Weights   06/20/22 0239 06/22/22 0600  Weight: 108.9 kg 105.3 kg    Examination: General: Adult male, resting in bed, in NAD. Neuro: Somnolent after Ativan dosed this AM. Per RN, still not following commands and gets quite agitated. HEENT: [place]/AT. Sclerae anicteric. MM moist. Cardiovascular: RRR, no M/R/G.  Lungs: Respirations even and unlabored.  CTA bilaterally, No W/R/R. Abdomen: BS x 4, soft, NT/ND.  Musculoskeletal: No  gross deformities, no edema.  Skin: Intact, warm, no rashes.  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 Tobacco abuse  ?OSA - BiPAP PRN and with sleep/naps - Wean supplemental oxygen for SpO2 88% or greater - Temains a DNR/ DNI  - Continue BDs - D/c solumedrol to eliminate worsening agitation etc - would benefit from outpt sleep study and weight loss  - smoking cessation  Encephalopathy - in setting of alcohol withdrawal and B12 deficiency - supportive care as above  - no signs of infectious etiology, monitor   NSETMI/ angina, CAD, HTN - management per cardiology, plans for LHC once over withdrawal and can safely perform - heparin per pharmacy  - Lopressor   Best Practice (right click and "Reselect all SmartList Selections" daily)   Diet/type: NPO DVT prophylaxis: systemic heparin GI prophylaxis: N/A Lines: N/A Foley:  N/A Code Status:  DNR Last date of multidisciplinary goals of care discussion [3/24]  Jeanie Cooks, daughter, [phone], called, no answer, no voicemail Melody Ward- listed [phone] called> not the right number which was removed    Critical care time: 30 mins    Montey Hora, PA - [facility] [place] [facility] For pager details, please see AMION or use Epic chat  After 1900, please call [facility] for cross coverage needs 06/23/2022, 9:07 AM

## 2022-06-23 NOTE — Progress Notes (Signed)
   Patient Name: [name] Oneal Date of Encounter: 06/23/2022 [place] Cardiologist: Minus Breeding, MD   Interval Summary   Wakes up.  O2 on facemask now.  Still agitated.    Vital Signs   Vitals:   06/23/22 0500 06/23/22 0600 06/23/22 0700 06/23/22 0742  BP: (!) 146/81 (!) 149/82 (!) 148/81   Pulse: 65 66 69   Resp: 14 15 17    Temp:      TempSrc:      SpO2: 95% 95% 95% 96%  Weight:      Height:        Intake/Output Summary (Last 24 hours) at 06/23/2022 0804 Last data filed at 06/23/2022 0700 Gross per 24 hour  Intake 825.18 ml  Output 1110 ml  Net -284.82 ml      06/22/2022    6:00 AM 06/20/2022    2:39 AM 01/23/2016    9:19 PM  Last 3 Weights  Weight (lbs) 232 lb 2.3 oz 240 lb 233 lb 4 oz  Weight (kg) 105.3 kg 108.863 kg 105.802 kg      Telemetry/ECG    NSR, PVCs - Personally Reviewed  Physical Exam   GEN: No  acute distress.   Neck: No  JVD Cardiac: RRR, no murmurs, rubs, or gallops.  Respiratory:  Decreased breath sounds, diffuse wheezing GI: Soft, nontender, non-distended, decreased bowel sounds MS:  Mild diffuse edema; No deformity.   Assessment & Plan    Unstable angina:  Was for cath .  Continue heparin.     ETOH:  Per CCM management.    HTN:  Not taking POs.    DNR/DNI:  Patient had agreed to suspend this for the cath but was very clear about not wanting resuscitation/intubation otherwise.   Will start IV scheduled beta blocker.  HR has trended up and he tolerated beta blocker previously.    For questions or updates, please contact [place] Please consult [email or web address] for contact info under        Signed, Minus Breeding, MD

## 2022-06-23 NOTE — Progress Notes (Signed)
ANTICOAGULATION CONSULT NOTE -Follow Up Pharmacy Consult for Heparin Indication: chest pain/ACS/NSTEMI  No Known Allergies  Patient Measurements: Height: 5\' 10"  (177.8 cm) Weight: 105.3 kg (232 lb 2.3 oz) IBW/kg (Calculated) : 73 Heparin Dosing Weight: 97 kg  Vital Signs: Temp: 98.8 F (37.1 C) (03/26 0322) Temp Source: Axillary (03/26 0322) BP: 149/82 (03/26 0600) Pulse Rate: 66 (03/26 0600)  Labs: Recent Labs    06/21/22 0239 06/21/22 0540 06/21/22 1508 06/22/22 0442 06/23/22 0321  HGB 15.0  --   --  14.1 14.6  HCT 45.8  --   --  42.4 45.0  PLT 249  --   --  192 181  LABPROT  --   --   --  14.8  --   INR  --   --   --  1.2  --   HEPARINUNFRC  --    < > 0.67 1.10* 0.74*  CREATININE 1.02  --   --  1.04 1.05   < > = values in this interval not displayed.    Estimated Creatinine Clearance: 73.9 mL/min (by C-G formula based on SCr of 1.05 mg/dL).   Assessment: 77 y.o. male with chest pain for heparin. Pharmacy consulted to dose heparin for ACS. Hgb stable, plts down trending from 191>182. Heparin level returned slightly therapeutic at 0.74 despite dose decrease. Per RN no signs/symptoms of bleeding, and heparin drawn below site in different line. No issues with the infusion running.  Goal of Therapy:  Heparin level 0.3-0.7 units/ml Monitor platelets by anticoagulation protocol: Yes   Plan:  Decrease Heparin 1500 units/hr (~1 units/kg/hr) Follow up heparin level in 8 hours  Follow up plans for cath and agitation  Monitor for signs/symptoms of bleeding   Sandford Craze, PharmD. [facility] Acute Care PGY-1  06/23/2022 6:52 AM

## 2022-06-24 ENCOUNTER — Inpatient Hospital Stay (HOSPITAL_COMMUNITY): Payer: No Typology Code available for payment source

## 2022-06-24 DIAGNOSIS — I214 Non-ST elevation (NSTEMI) myocardial infarction: Secondary | ICD-10-CM

## 2022-06-24 DIAGNOSIS — I200 Unstable angina: Secondary | ICD-10-CM | POA: Diagnosis not present

## 2022-06-24 DIAGNOSIS — G9341 Metabolic encephalopathy: Secondary | ICD-10-CM | POA: Diagnosis not present

## 2022-06-24 DIAGNOSIS — F10931 Alcohol use, unspecified with withdrawal delirium: Secondary | ICD-10-CM | POA: Diagnosis not present

## 2022-06-24 LAB — CBC
HCT: 45.4 % (ref 39.0–52.0)
Hemoglobin: 14.2 g/dL (ref 13.0–17.0)
MCH: 34.4 pg — ABNORMAL HIGH (ref 26.0–34.0)
MCHC: 31.3 g/dL (ref 30.0–36.0)
MCV: 109.9 fL — ABNORMAL HIGH (ref 80.0–100.0)
Platelets: 177 10*3/uL (ref 150–400)
RBC: 4.13 MIL/uL — ABNORMAL LOW (ref 4.22–5.81)
RDW: 14.9 % (ref 11.5–15.5)
WBC: 9.7 10*3/uL (ref 4.0–10.5)
nRBC: 0 % (ref 0.0–0.2)

## 2022-06-24 LAB — ECHOCARDIOGRAM COMPLETE
Area-P 1/2: 3.85 cm2
Calc EF: 53.4 %
Height: 70 in
S' Lateral: 3.6 cm
Single Plane A2C EF: 59.8 %
Single Plane A4C EF: 49.3 %
Weight: 3714.31 oz

## 2022-06-24 LAB — BASIC METABOLIC PANEL
Anion gap: 9 (ref 5–15)
BUN: 17 mg/dL (ref 8–23)
CO2: 28 mmol/L (ref 22–32)
Calcium: 8.6 mg/dL — ABNORMAL LOW (ref 8.9–10.3)
Chloride: 103 mmol/L (ref 98–111)
Creatinine, Ser: 0.99 mg/dL (ref 0.61–1.24)
GFR, Estimated: >60 mL/min (ref >60)
Glucose, Bld: 107 mg/dL — ABNORMAL HIGH (ref 70–99)
Potassium: 4.1 mmol/L (ref 3.5–5.1)
Sodium: 140 mmol/L (ref 135–145)

## 2022-06-24 LAB — PHOSPHORUS
Phosphorus: 3.1 mg/dL (ref 2.5–4.6)
Phosphorus: 3 mg/dL (ref 2.5–4.6)

## 2022-06-24 LAB — GLUCOSE, CAPILLARY
Glucose-Capillary: 101 mg/dL — ABNORMAL HIGH (ref 70–99)
Glucose-Capillary: 103 mg/dL — ABNORMAL HIGH (ref 70–99)
Glucose-Capillary: 103 mg/dL — ABNORMAL HIGH (ref 70–99)
Glucose-Capillary: 110 mg/dL — ABNORMAL HIGH (ref 70–99)
Glucose-Capillary: 110 mg/dL — ABNORMAL HIGH (ref 70–99)
Glucose-Capillary: 118 mg/dL — ABNORMAL HIGH (ref 70–99)
Glucose-Capillary: 130 mg/dL — ABNORMAL HIGH (ref 70–99)

## 2022-06-24 LAB — MAGNESIUM
Magnesium: 2.3 mg/dL (ref 1.7–2.4)
Magnesium: 2.2 mg/dL (ref 1.7–2.4)
Magnesium: 2 mg/dL (ref 1.7–2.4)

## 2022-06-24 LAB — HEPARIN LEVEL (UNFRACTIONATED): Heparin Unfractionated: 0.69 IU/mL (ref 0.30–0.70)

## 2022-06-24 LAB — HEMOGLOBIN A1C
Hgb A1c MFr Bld: 5.9 % — ABNORMAL HIGH (ref 4.8–5.6)
Mean Plasma Glucose: 123 mg/dL

## 2022-06-24 MED ORDER — ENOXAPARIN SODIUM 40 MG/0.4ML IJ SOSY
40.0000 mg | PREFILLED_SYRINGE | INTRAMUSCULAR | Status: DC
  Administered 2022-06-24 – 2022-07-14 (×21): 40 mg via SUBCUTANEOUS
  Filled 2022-06-24 (×21): qty 0.4

## 2022-06-24 MED ORDER — PERFLUTREN LIPID MICROSPHERE
1.0000 mL | INTRAVENOUS | Status: AC | PRN
  Administered 2022-06-24: 2 mL via INTRAVENOUS

## 2022-06-24 MED ORDER — FREE WATER
30.0000 mL | Status: DC
  Administered 2022-06-24 – 2022-06-26 (×12): 30 mL

## 2022-06-24 MED ORDER — PHENOBARBITAL SODIUM 130 MG/ML IJ SOLN
130.0000 mg | Freq: Three times a day (TID) | INTRAMUSCULAR | Status: DC
  Administered 2022-06-24 – 2022-06-25 (×3): 130 mg via INTRAVENOUS
  Filled 2022-06-24 (×3): qty 1

## 2022-06-24 MED ORDER — METOPROLOL TARTRATE 25 MG PO TABS
25.0000 mg | ORAL_TABLET | Freq: Two times a day (BID) | ORAL | Status: DC
  Administered 2022-06-24 – 2022-06-25 (×3): 25 mg
  Filled 2022-06-24 (×3): qty 1

## 2022-06-24 MED ORDER — ASPIRIN 81 MG PO CHEW
81.0000 mg | CHEWABLE_TABLET | Freq: Every day | ORAL | Status: DC
  Administered 2022-06-24 – 2022-07-06 (×13): 81 mg
  Filled 2022-06-24 (×13): qty 1

## 2022-06-24 MED ORDER — NICOTINE 21 MG/24HR TD PT24
21.0000 mg | MEDICATED_PATCH | Freq: Every day | TRANSDERMAL | Status: DC
  Administered 2022-06-24 – 2022-07-14 (×21): 21 mg via TRANSDERMAL
  Filled 2022-06-24 (×21): qty 1

## 2022-06-24 MED ORDER — ATORVASTATIN CALCIUM 80 MG PO TABS
80.0000 mg | ORAL_TABLET | Freq: Every day | ORAL | Status: DC
  Administered 2022-06-24 – 2022-07-06 (×13): 80 mg
  Filled 2022-06-24 (×13): qty 1

## 2022-06-24 MED ORDER — VITAL AF 1.2 CAL PO LIQD
1000.0000 mL | ORAL | Status: DC
  Administered 2022-06-24 – 2022-06-30 (×4): 1000 mL
  Filled 2022-06-24 (×2): qty 1000

## 2022-06-24 MED ORDER — SODIUM CHLORIDE 0.9 % IV SOLN
260.0000 mg | Freq: Once | INTRAVENOUS | Status: AC
  Administered 2022-06-24: 260 mg via INTRAVENOUS
  Filled 2022-06-24: qty 2

## 2022-06-24 MED ORDER — THIAMINE MONONITRATE 100 MG PO TABS
100.0000 mg | ORAL_TABLET | Freq: Every day | ORAL | Status: DC
  Administered 2022-06-25 – 2022-07-06 (×12): 100 mg
  Filled 2022-06-24 (×12): qty 1

## 2022-06-24 MED ORDER — PHENOBARBITAL SODIUM 130 MG/ML IJ SOLN
130.0000 mg | Freq: Three times a day (TID) | INTRAMUSCULAR | Status: DC
  Filled 2022-06-24: qty 1

## 2022-06-24 MED ORDER — PROSOURCE TF20 ENFIT COMPATIBL EN LIQD
60.0000 mL | Freq: Four times a day (QID) | ENTERAL | Status: DC
  Administered 2022-06-24 – 2022-06-30 (×23): 60 mL
  Filled 2022-06-24 (×23): qty 60

## 2022-06-24 MED ORDER — CHLORDIAZEPOXIDE HCL 5 MG PO CAPS: 25.0000 mg | ORAL_CAPSULE | ORAL | Status: DC

## 2022-06-24 MED ORDER — THIAMINE HCL 100 MG/ML IJ SOLN: 100.0000 mg | Freq: Every day | INTRAMUSCULAR | Status: DC

## 2022-06-24 MED ORDER — CHLORDIAZEPOXIDE HCL 5 MG PO CAPS: 25.0000 mg | ORAL_CAPSULE | Freq: Three times a day (TID) | ORAL | Status: DC

## 2022-06-24 MED ORDER — LISINOPRIL 20 MG PO TABS
20.0000 mg | ORAL_TABLET | Freq: Every day | ORAL | Status: DC
  Administered 2022-06-25 – 2022-07-06 (×12): 20 mg
  Filled 2022-06-24 (×12): qty 1

## 2022-06-24 MED ORDER — CHLORDIAZEPOXIDE HCL 5 MG PO CAPS
25.0000 mg | ORAL_CAPSULE | Freq: Four times a day (QID) | ORAL | Status: DC
  Administered 2022-06-24 (×2): 25 mg
  Filled 2022-06-24 (×2): qty 5

## 2022-06-24 MED ORDER — FOLIC ACID 1 MG PO TABS
1.0000 mg | ORAL_TABLET | Freq: Every day | ORAL | Status: DC
  Administered 2022-06-25 – 2022-07-09 (×15): 1 mg
  Filled 2022-06-24 (×16): qty 1

## 2022-06-24 MED ORDER — CHLORDIAZEPOXIDE HCL 5 MG PO CAPS: 25.0000 mg | ORAL_CAPSULE | Freq: Every day | ORAL | Status: DC

## 2022-06-24 NOTE — Progress Notes (Signed)
NAME:  [name] Oneal, MRN:  [id number], DOB:  December 31, 1945, LOS: 4 ADMISSION DATE:  06/20/2022, CONSULTATION DATE:  06/21/2022 REFERRING MD:  Frann Rider, MD CHIEF COMPLAINT:  Alcohol Withdrawal   History of Present Illness:  [name] Oneal is a 77 year old male, daily smoker with CAD who was admitted 3/23 for chest pain noted to have an NSTEMI. 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 He has received 12mg  of IV ativan since night shift and remains very fidgety and restless. CIWA scores are 18-21.   PCCM consulted for concern of progressive alcohol withdrawal.   Pertinent Medical History:   Past Medical History:  Diagnosis Date   Alcoholic ([place])    Hypertension   CAD s/p DES in 2003 Tobacco Use  Significant Hospital Events: Including procedures, antibiotic start and stop dates in addition to other pertinent events   3/23 admitted for NSTEMI 3/24 PCCM consulted for alcohol withdrawal 3/27 Agitated overnight requiring restraints. Remains on Precedex. Phenobarb ongoing. Cortrak today.  Interim History / Subjective:  Agitated overnight requiring restraints, uptitration of Precedex Zyprexa x 1 without significant improvement Continues on Precedex, phenobarb, CIWA, valproate Limited without enteral access, will get Cortrak today Nicotine patch added as patient was still actively smoking PTA Daughter updated at bedside  Objective   Blood pressure (!) 143/80, pulse (!) 54, temperature 97.7 F (36.5 C), temperature source Axillary, resp. rate 20, height 5\' 10"  (1.778 m), weight 105.3 kg, SpO2 93 %.    Vent Mode: PCV;BIPAP FiO2 (%):  [45 %-50 %] 50 % Set Rate:  [12 bmp] 12 bmp Pressure Support:  [9 cmH20] 9 cmH20   Intake/Output Summary (Last 24 hours) at 06/24/2022 0801 Last data filed at 06/24/2022 0600 Gross per 24 hour  Intake 1000.57 ml  Output 650 ml  Net 350.57 ml    Filed Weights   06/20/22 0239  06/22/22 0600  Weight: 108.9 kg 105.3 kg   Physical Examination: General: Chronically ill-appearing elderly man in NAD. Sedated, wrist restraints in place. HEENT: [place]/AT, anicteric sclera, PERRL 85mm, moist mucous membranes. Neuro: Sedated. Responds to noxious stimuli. and Withdraws to pain in all 4 extremities. Not following commands. Moves all 4 extremities spontaneously. CV: RRR, no m/g/r. PULM: Breathing even and unlabored on Venti mask. Lung fields with faint expiratory wheezing throughout, diminished at bilateral bases. GI: Obese, soft, nontender, mildly distended. Normoactive bowel sounds. Extremities: Trace symmetric BLE edema noted. Skin: Warm/dry, no rashes.  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 Tobacco abuse  ?OSA - Continue supplemental O2 support - BiPAP PRN + QHS - Wean O2 for sat > 88% - Bronchodilators Garlon Hatchet, Yupelri) - Pulmonary hygiene - Remains DNR/DNI - Outpatient pulmonary/sleep study - Encourage smoking cessation, weight loss  Acute toxic/metabolic encephalopathy - in setting of alcohol withdrawal and B12 deficiency - Supportive care - Treat EtOH WD, electrolyte derangements  NSTEMI/angina, CAD, HTN - Cardiology consulted, appreciate assistance - LHC once more stable  - Heparin gtt per pharmacy - Lopressor Q6H  At risk for malnutrition - Nutrition/RD consult for TF -  Cortrak placement today, 3/27  Best Practice (right click and "Reselect all SmartList Selections" daily)   Diet/type: NPO DVT prophylaxis: systemic  heparin GI prophylaxis: N/A Lines: N/A Foley:  N/A Code Status:  DNR Last date of multidisciplinary goals of care discussion [3/24]  Jeanie Cooks, daughter, [phone], called, no answer, no voicemail Melody Ward- listed [phone] called> not the right number which was removed   Critical care time: 33 minutes   The patient is critically ill with multiple organ system failure and requires high complexity decision making for assessment and support, frequent evaluation and titration of therapies, advanced monitoring, review of radiographic studies and interpretation of complex data.   Critical Care Time devoted to patient care services, exclusive of separately billable procedures, described in this note is 33 minutes.  [name] Mount, PA-C [facility] [facility] 06/24/22 8:01 AM  Please see [email or web address] for pager details.  From 7A-7P if no response, please call [phone] After hours, please call ELink [phone]

## 2022-06-24 NOTE — Progress Notes (Signed)
    Still requiring sedation.  Please call when we can reconsider cath.  OK to stop heparin.    For questions or updates, please contact [place] Please consult [email or web address] for contact info under     Signed, Minus Breeding, MD

## 2022-06-24 NOTE — Procedures (Signed)
Cortrak  Person Inserting Tube:  Najma Bozarth T, RD Tube Type:  Cortrak - 43 inches Tube Size:  10 Tube Location:  Left nare Secured by: Bridle Technique Used to Measure Tube Placement:  Marking at nare/corner of mouth Cortrak Secured At:  74 cm   Cortrak Tube Team Note:  Consult received to place a Cortrak feeding tube.   X-ray is required, abdominal x-ray has been ordered by the Cortrak team. Please confirm tube placement before using the Cortrak tube.   If the tube becomes dislodged please keep the tube and contact the Cortrak team at [email or web address] for replacement.  If after hours and replacement cannot be delayed, place a NG tube and confirm placement with an abdominal x-ray.    Sigurd Pugh RD, LDN For contact information, refer to AMiON.   

## 2022-06-24 NOTE — Progress Notes (Signed)
  Echocardiogram 2D Echocardiogram has been performed.  [name] Oneal 06/24/2022, 3:30 PM

## 2022-06-24 NOTE — Progress Notes (Signed)
Initial Nutrition Assessment  DOCUMENTATION CODES:   Obesity unspecified  INTERVENTION:  - Initiate Vital AF 1.2 @ 20 mL/hr and advance by 10 mL Q8H to goal rate of 40 mL/hr + TF20 PS QID + FWF 30 mL Q4H.  - This provides 1472 kcals, 152 gm protein and 778 mL free water (958 mL total with FWF).   NUTRITION DIAGNOSIS:   Inadequate oral intake related to poor appetite, lethargy/confusion as evidenced by meal completion < 50%.  GOAL:   Provide needs based on ASPEN/SCCM guidelines  MONITOR:   TF tolerance  REASON FOR ASSESSMENT:   Consult Enteral/tube feeding initiation and management  ASSESSMENT:   77 y.o. male admits related to chest pain. PMH includes: alcoholic, HTN. Pt is currently receiving medical management related to unstable angina.  Meds reviewed:  folic acid, MVI, thiamine. Labs reviewed.   MD consult for EN management. Pt is currently disoriented x4. RN reports that the pt's daughter just left. No significant wt loss per record. Pt is currently on a heart healthy diet. Pt with Cortrak in place. RD will initiate TF with slow advancement to goal rate. RD will continue to monitor EN tolerance.   NUTRITION - FOCUSED PHYSICAL EXAM:  Will assess on Friday 3/29.   Diet Order:   Diet Order             Diet Heart Room service appropriate? Yes; Fluid consistency: Thin  Diet effective now                   EDUCATION NEEDS:   Not appropriate for education at this time  Skin:  Skin Assessment: Reviewed RN Assessment  Last BM:  3/24  Height:   Ht Readings from Last 1 Encounters:  06/20/22 5\' 10"  (1.778 m)    Weight:   Wt Readings from Last 1 Encounters:  06/22/22 105.3 kg    Ideal Body Weight:     BMI:  Body mass index is 33.31 kg/m.  Estimated Nutritional Needs:   Kcal:  1150-1475 kcals  Protein:  150-170 gm  Fluid:  >/= 1.1 L  Thalia Bloodgood, RD, LDN, CNSC.

## 2022-06-24 NOTE — Progress Notes (Addendum)
ANTICOAGULATION CONSULT NOTE -Follow Up Pharmacy Consult for Heparin Indication: chest pain/ACS/NSTEMI  No Known Allergies  Patient Measurements: Height: 5\' 10"  (177.8 cm) Weight: 105.3 kg (232 lb 2.3 oz) IBW/kg (Calculated) : 73 Heparin Dosing Weight: 97 kg  Vital Signs: Temp: 97.7 F (36.5 C) (03/27 0400) Temp Source: Axillary (03/27 0400) BP: 143/75 (03/27 0400) Pulse Rate: 57 (03/27 0400)  Labs: Recent Labs    06/22/22 0442 06/23/22 0321 06/24/22 0050  HGB 14.1 14.6 14.2  HCT 42.4 45.0 45.4  PLT 192 181 177  LABPROT 14.8  --   --   INR 1.2  --   --   HEPARINUNFRC 1.10* 0.74* 0.69  CREATININE 1.04 1.05 0.99    Estimated Creatinine Clearance: 78.3 mL/min (by C-G formula based on SCr of 0.99 mg/dL).   Assessment: 77 y.o. male with chest pain for heparin. Pharmacy consulted to dose heparin for ACS. Hgb stable, plts down trending from 191>182. Heparin level returned therapeutic at 0.69. Per RN no signs/symptoms of bleeding, and heparin drawn below site in different line. No issues with the infusion running. Per RN no issues with the infusion or signs/symptoms of bleeding.  Goal of Therapy:  Heparin level 0.3-0.7 units/ml Monitor platelets by anticoagulation protocol: Yes   Plan:  Decrease Heparin 1450 units/hr to stay therapeutic  Follow up heparin level daily Follow up plans for cath and agitation  Monitor for signs/symptoms of bleeding   Sandford Craze, PharmD. [facility] Acute Care PGY-1  06/24/2022 7:17 AM      ADDENDUM -No plans for cath at this time- we will d/c the sys heparin and start DVT ppx.  Sandford Craze, PharmD. [facility] Acute Care PGY-1  06/24/2022 10:33 AM

## 2022-06-25 ENCOUNTER — Inpatient Hospital Stay (HOSPITAL_COMMUNITY): Payer: No Typology Code available for payment source

## 2022-06-25 DIAGNOSIS — I214 Non-ST elevation (NSTEMI) myocardial infarction: Secondary | ICD-10-CM | POA: Diagnosis not present

## 2022-06-25 DIAGNOSIS — Z720 Tobacco use: Secondary | ICD-10-CM

## 2022-06-25 DIAGNOSIS — I200 Unstable angina: Secondary | ICD-10-CM | POA: Diagnosis not present

## 2022-06-25 DIAGNOSIS — J9601 Acute respiratory failure with hypoxia: Secondary | ICD-10-CM | POA: Diagnosis not present

## 2022-06-25 LAB — POCT I-STAT 7, (LYTES, BLD GAS, ICA,H+H)
Acid-Base Excess: 2 mmol/L (ref 0.0–2.0)
Bicarbonate: 28.6 mmol/L — ABNORMAL HIGH (ref 20.0–28.0)
Calcium, Ion: 1.33 mmol/L (ref 1.15–1.40)
HCT: 41 % (ref 39.0–52.0)
Hemoglobin: 13.9 g/dL (ref 13.0–17.0)
O2 Saturation: 97 %
Patient temperature: 98.4
Potassium: 4.1 mmol/L (ref 3.5–5.1)
Sodium: 141 mmol/L (ref 135–145)
TCO2: 30 mmol/L (ref 22–32)
pCO2 arterial: 53.7 mmHg — ABNORMAL HIGH (ref 32–48)
pH, Arterial: 7.335 — ABNORMAL LOW (ref 7.35–7.45)
pO2, Arterial: 94 mmHg (ref 83–108)

## 2022-06-25 LAB — HEMOGLOBIN A1C
Hgb A1c MFr Bld: 5.9 % — ABNORMAL HIGH (ref 4.8–5.6)
Mean Plasma Glucose: 123 mg/dL

## 2022-06-25 LAB — CBC
HCT: 43.4 % (ref 39.0–52.0)
Hemoglobin: 14 g/dL (ref 13.0–17.0)
MCH: 34.7 pg — ABNORMAL HIGH (ref 26.0–34.0)
MCHC: 32.3 g/dL (ref 30.0–36.0)
MCV: 107.7 fL — ABNORMAL HIGH (ref 80.0–100.0)
Platelets: 181 10*3/uL (ref 150–400)
RBC: 4.03 MIL/uL — ABNORMAL LOW (ref 4.22–5.81)
RDW: 14.7 % (ref 11.5–15.5)
WBC: 9.2 10*3/uL (ref 4.0–10.5)
nRBC: 0 % (ref 0.0–0.2)

## 2022-06-25 LAB — PHOSPHORUS
Phosphorus: 3.2 mg/dL (ref 2.5–4.6)
Phosphorus: 2.4 mg/dL — ABNORMAL LOW (ref 2.5–4.6)

## 2022-06-25 LAB — TSH: TSH: 0.361 u[IU]/mL (ref 0.350–4.500)

## 2022-06-25 LAB — COMPREHENSIVE METABOLIC PANEL
ALT: 32 U/L (ref 0–44)
AST: 22 U/L (ref 15–41)
Albumin: 3.1 g/dL — ABNORMAL LOW (ref 3.5–5.0)
Alkaline Phosphatase: 44 U/L (ref 38–126)
Anion gap: 7 (ref 5–15)
BUN: 27 mg/dL — ABNORMAL HIGH (ref 8–23)
CO2: 29 mmol/L (ref 22–32)
Calcium: 8.6 mg/dL — ABNORMAL LOW (ref 8.9–10.3)
Chloride: 103 mmol/L (ref 98–111)
Creatinine, Ser: 1 mg/dL (ref 0.61–1.24)
GFR, Estimated: >60 mL/min (ref >60)
Glucose, Bld: 115 mg/dL — ABNORMAL HIGH (ref 70–99)
Potassium: 4 mmol/L (ref 3.5–5.1)
Sodium: 139 mmol/L (ref 135–145)
Total Bilirubin: 1.2 mg/dL (ref 0.3–1.2)
Total Protein: 5.9 g/dL — ABNORMAL LOW (ref 6.5–8.1)

## 2022-06-25 LAB — GLUCOSE, CAPILLARY
Glucose-Capillary: 113 mg/dL — ABNORMAL HIGH (ref 70–99)
Glucose-Capillary: 115 mg/dL — ABNORMAL HIGH (ref 70–99)
Glucose-Capillary: 135 mg/dL — ABNORMAL HIGH (ref 70–99)
Glucose-Capillary: 138 mg/dL — ABNORMAL HIGH (ref 70–99)
Glucose-Capillary: 146 mg/dL — ABNORMAL HIGH (ref 70–99)
Glucose-Capillary: 98 mg/dL (ref 70–99)

## 2022-06-25 LAB — PROCALCITONIN: Procalcitonin: <0.1 ng/mL

## 2022-06-25 LAB — MAGNESIUM
Magnesium: 2.2 mg/dL (ref 1.7–2.4)
Magnesium: 2.1 mg/dL (ref 1.7–2.4)

## 2022-06-25 LAB — AMMONIA: Ammonia: 61 umol/L — ABNORMAL HIGH (ref 9–35)

## 2022-06-25 LAB — PROTIME-INR
INR: 1.2 (ref 0.8–1.2)
Prothrombin Time: 14.8 seconds (ref 11.4–15.2)

## 2022-06-25 LAB — T4, FREE: Free T4: 0.88 ng/dL (ref 0.61–1.12)

## 2022-06-25 MED ORDER — FUROSEMIDE 10 MG/ML IJ SOLN
40.0000 mg | Freq: Once | INTRAMUSCULAR | Status: AC
  Administered 2022-06-25: 40 mg via INTRAVENOUS
  Filled 2022-06-25: qty 4

## 2022-06-25 MED ORDER — CHLORDIAZEPOXIDE HCL 5 MG PO CAPS
25.0000 mg | ORAL_CAPSULE | Freq: Four times a day (QID) | ORAL | Status: DC
  Administered 2022-06-25 – 2022-06-26 (×5): 25 mg
  Filled 2022-06-25 (×5): qty 5

## 2022-06-25 MED ORDER — PHENOBARBITAL SODIUM 130 MG/ML IJ SOLN
97.0000 mg | Freq: Three times a day (TID) | INTRAMUSCULAR | Status: DC
  Administered 2022-06-25 – 2022-06-26 (×2): 97 mg via INTRAVENOUS
  Filled 2022-06-25 (×2): qty 1

## 2022-06-25 MED ORDER — METOPROLOL TARTRATE 50 MG PO TABS: 50.0000 mg | ORAL_TABLET | Freq: Two times a day (BID) | ORAL | Status: DC

## 2022-06-25 MED ORDER — HYDRALAZINE HCL 20 MG/ML IJ SOLN
INTRAMUSCULAR | Status: AC
  Administered 2022-06-25: 10 mg via INTRAVENOUS
  Filled 2022-06-25: qty 1

## 2022-06-25 MED ORDER — FUROSEMIDE 10 MG/ML IJ SOLN
20.0000 mg | Freq: Once | INTRAMUSCULAR | Status: AC
  Administered 2022-06-25: 20 mg via INTRAVENOUS
  Filled 2022-06-25: qty 2

## 2022-06-25 MED ORDER — CHLORDIAZEPOXIDE HCL 5 MG PO CAPS: 25.0000 mg | ORAL_CAPSULE | Freq: Four times a day (QID) | ORAL | Status: DC

## 2022-06-25 MED ORDER — CLONIDINE HCL 0.1 MG PO TABS
0.1000 mg | ORAL_TABLET | Freq: Three times a day (TID) | ORAL | Status: DC
  Administered 2022-06-25 – 2022-06-26 (×3): 0.1 mg
  Filled 2022-06-25 (×3): qty 1

## 2022-06-25 MED ORDER — POLYETHYLENE GLYCOL 3350 17 G PO PACK
17.0000 g | PACK | Freq: Two times a day (BID) | ORAL | Status: DC
  Administered 2022-06-25: 17 g
  Filled 2022-06-25: qty 1

## 2022-06-25 MED ORDER — HYDRALAZINE HCL 20 MG/ML IJ SOLN
10.0000 mg | INTRAMUSCULAR | Status: DC | PRN
  Administered 2022-06-27 (×2): 10 mg via INTRAVENOUS
  Filled 2022-06-25 (×2): qty 1

## 2022-06-25 MED ORDER — POTASSIUM CHLORIDE 20 MEQ PO PACK
20.0000 meq | PACK | Freq: Once | ORAL | Status: AC
  Administered 2022-06-25: 20 meq
  Filled 2022-06-25: qty 1

## 2022-06-25 MED ORDER — METOPROLOL TARTRATE 5 MG/5ML IV SOLN
5.0000 mg | Freq: Once | INTRAVENOUS | Status: AC
  Administered 2022-06-25: 5 mg via INTRAVENOUS
  Filled 2022-06-25: qty 5

## 2022-06-25 MED ORDER — METOPROLOL TARTRATE 25 MG PO TABS
25.0000 mg | ORAL_TABLET | Freq: Two times a day (BID) | ORAL | Status: DC
  Administered 2022-06-25 – 2022-07-01 (×12): 25 mg
  Filled 2022-06-25 (×12): qty 1

## 2022-06-25 MED ORDER — LACTULOSE 10 GM/15ML PO SOLN
10.0000 g | Freq: Two times a day (BID) | ORAL | Status: DC
  Administered 2022-06-25 – 2022-06-30 (×10): 10 g
  Filled 2022-06-25 (×10): qty 15

## 2022-06-25 MED ORDER — SENNOSIDES-DOCUSATE SODIUM 8.6-50 MG PO TABS
1.0000 | ORAL_TABLET | Freq: Two times a day (BID) | ORAL | Status: DC
  Administered 2022-06-25 (×2): 1
  Filled 2022-06-25 (×2): qty 1

## 2022-06-25 NOTE — Progress Notes (Signed)
NAME:  [name] Oneal, MRN:  [id number], DOB:  June 19, 1945, LOS: 5 ADMISSION DATE:  06/20/2022, CONSULTATION DATE:  06/21/2022 REFERRING MD:  Frann Rider, MD CHIEF COMPLAINT:  Alcohol Withdrawal   History of Present Illness:  [name] Oneal is a 77 year old male, daily smoker with CAD who was admitted 3/23 for chest pain noted to have an NSTEMI. 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 He has received 12mg  of IV ativan since night shift and remains very fidgety and restless. CIWA scores are 18-21.   PCCM consulted for concern of progressive alcohol withdrawal.   Worsening oxygenation overnight, now on 80%  Pertinent Medical History:   Past Medical History:  Diagnosis Date   Alcoholic ([place])    Hypertension   CAD s/p DES in 2003 Tobacco Use  Significant Hospital Events: Including procedures, antibiotic start and stop dates in addition to other pertinent events   3/23 admitted for NSTEMI 3/24 PCCM consulted for alcohol withdrawal 3/27 Agitated overnight requiring restraints. Remains on Precedex. Phenobarb ongoing. Cortrak today.  Interim History / Subjective:  Poorly responsive Worsening FiO2 needs on biPAP, on 80%, 8 peep Precdex 0.5, now 0.3 Still no BM x 4 days  Objective   Blood pressure 129/60, pulse 62, temperature 98.4 F (36.9 C), temperature source Oral, resp. rate 20, height 5\' 10"  (1.778 m), weight 109.5 kg, SpO2 90 %.    Vent Mode: PCV;BIPAP FiO2 (%):  [45 %-80 %] 80 % Set Rate:  [12 bmp] 12 bmp PEEP:  [5 cmH20-8 cmH20] 8 cmH20   Intake/Output Summary (Last 24 hours) at 06/25/2022 1033 Last data filed at 06/25/2022 0800 Gross per 24 hour  Intake 1403.56 ml  Output 545 ml  Net 858.56 ml   Filed Weights   06/20/22 0239 06/22/22 0600 06/25/22 0500  Weight: 108.9 kg 105.3 kg 109.5 kg   Physical Examination: Dex 0.3 General:  ill appearing elderly male in NAD on  HEENT: full face mask  present, pupils 4/reactive Neuro: minimally grimaces to noxious stimuli  CV: rr, NSR PULM:  non labored on BiPAP, clear anteriorly, few scattered rhonchi R base/ diminished, coughs intermittent GI: protuberant, soft, few high pitched BS, condom cath,  Extremities: warm/dry, trace generalized edema   UOP 544ml/ 24hrs Net +1.9L   Assessment & Plan:   Acute Alcohol Withdrawal/ DT ETOH abuse  B12 Deficiency - s/p B12 IM injection Folate Deficiency - minimize precedex, continue CIWA  - cont phenobarbital, valproate, librium> start weaning as able.  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  Consider atelectasis vs aspiration vs volume.  Net +2L, checking PCT, hold abx for now  - cont BiPAP, remains a DNR/ DNI - lasix 20mg  x 1 (lasix naive)  - wean FiO2 as able > 88% - Bronchodilators Garlon Hatchet, Yupelri) - Pulmonary hygiene - Outpatient pulmonary/sleep study - Encourage smoking cessation, weight loss when appropriate   Acute toxic/metabolic encephalopathy - in setting of alcohol withdrawal and B12 deficiency - ABG, check ammonia/ TSH, FT4 - r/o / consider infectious symptoms> WBC norm, afebrile> check PCT - consider CTH> has been non-focal but  consider if any focal changes  - Supportive care - Treat EtOH WD as above, electrolyte derangements  NSTEMI/angina, CAD, HTN - Cardiology following, appreciate assistance> need to reach out to Cards when mental status improved and can go for [facility] - ok to stop heparin gtt - cont lopressor, lisinopril, ASA, prn hydralazine - TTE 3/27> EF 50-55%,  mod LVH, G2DD, moderately reduced RVSF, mod elevated PASP, borderline dilation of ascending aorta, 30mm, IVC dilated, RA ~15  At risk for malnutrition - TF per cortrak, RD recs   Constipation  - no BM documented since admit - miralax BID, senokot, dulcolax  - KUB 3/27 with non-obstructive gas pattern - mobilize when able  Best Practice (right click and "Reselect all SmartList Selections" daily)   Diet/type: NPO; TF  DVT prophylaxis: LMWH GI prophylaxis: N/A Lines: N/A Foley:  N/A Code Status:  DNR DNI Last date of multidisciplinary goals of care discussion [3/24]  Jeanie Cooks, daughter, [place], significant other>  [phone]  No family at bedside.  Pending update 3/28  Critical care time: 38 minutes   The patient is critically ill with multiple organ system failure and requires high complexity decision making for assessment and support, frequent evaluation and titration of therapies, advanced monitoring, review of radiographic studies and interpretation of complex data.   Critical Care Time devoted to patient care services, exclusive of separately billable procedures, described in this note is 33 minutes.  Kennieth Rad, NP [place] [facility] 06/25/22 10:33 AM  Please see [email or web address] for pager details.  From 7A-7P if no response, please call [phone] After hours, please call ELink [phone]

## 2022-06-26 DIAGNOSIS — I200 Unstable angina: Secondary | ICD-10-CM | POA: Diagnosis not present

## 2022-06-26 LAB — BASIC METABOLIC PANEL
Anion gap: 10 (ref 5–15)
BUN: 36 mg/dL — ABNORMAL HIGH (ref 8–23)
CO2: 31 mmol/L (ref 22–32)
Calcium: 9.6 mg/dL (ref 8.9–10.3)
Chloride: 103 mmol/L (ref 98–111)
Creatinine, Ser: 1.1 mg/dL (ref 0.61–1.24)
GFR, Estimated: >60 mL/min (ref >60)
Glucose, Bld: 112 mg/dL — ABNORMAL HIGH (ref 70–99)
Potassium: 3.5 mmol/L (ref 3.5–5.1)
Sodium: 144 mmol/L (ref 135–145)

## 2022-06-26 LAB — AMMONIA: Ammonia: 55 umol/L — ABNORMAL HIGH (ref 9–35)

## 2022-06-26 LAB — GLUCOSE, CAPILLARY
Glucose-Capillary: 129 mg/dL — ABNORMAL HIGH (ref 70–99)
Glucose-Capillary: 123 mg/dL — ABNORMAL HIGH (ref 70–99)
Glucose-Capillary: 138 mg/dL — ABNORMAL HIGH (ref 70–99)
Glucose-Capillary: 164 mg/dL — ABNORMAL HIGH (ref 70–99)

## 2022-06-26 LAB — CBC
HCT: 43.8 % (ref 39.0–52.0)
Hemoglobin: 14.3 g/dL (ref 13.0–17.0)
MCH: 34.5 pg — ABNORMAL HIGH (ref 26.0–34.0)
MCHC: 32.6 g/dL (ref 30.0–36.0)
MCV: 105.8 fL — ABNORMAL HIGH (ref 80.0–100.0)
Platelets: 213 10*3/uL (ref 150–400)
RBC: 4.14 MIL/uL — ABNORMAL LOW (ref 4.22–5.81)
RDW: 15.4 % (ref 11.5–15.5)
WBC: 13.7 10*3/uL — ABNORMAL HIGH (ref 4.0–10.5)
nRBC: 0 % (ref 0.0–0.2)

## 2022-06-26 LAB — MAGNESIUM: Magnesium: 2.2 mg/dL (ref 1.7–2.4)

## 2022-06-26 LAB — PROCALCITONIN: Procalcitonin: 0.31 ng/mL

## 2022-06-26 MED ORDER — CHLORDIAZEPOXIDE HCL 5 MG PO CAPS
10.0000 mg | ORAL_CAPSULE | Freq: Four times a day (QID) | ORAL | Status: DC
  Administered 2022-06-26 – 2022-06-27 (×4): 10 mg
  Filled 2022-06-26 (×4): qty 2

## 2022-06-26 MED ORDER — FUROSEMIDE 10 MG/ML IJ SOLN
40.0000 mg | Freq: Once | INTRAMUSCULAR | Status: AC
  Administered 2022-06-26: 40 mg via INTRAVENOUS
  Filled 2022-06-26: qty 4

## 2022-06-26 MED ORDER — CLONIDINE HCL 0.2 MG PO TABS
0.2000 mg | ORAL_TABLET | Freq: Three times a day (TID) | ORAL | Status: DC
  Administered 2022-06-26 – 2022-07-06 (×30): 0.2 mg
  Filled 2022-06-26: qty 2
  Filled 2022-06-26 (×2): qty 1
  Filled 2022-06-26 (×7): qty 2
  Filled 2022-06-26: qty 1
  Filled 2022-06-26: qty 2
  Filled 2022-06-26: qty 1
  Filled 2022-06-26 (×14): qty 2
  Filled 2022-06-26: qty 1
  Filled 2022-06-26 (×3): qty 2

## 2022-06-26 MED ORDER — POTASSIUM CHLORIDE 20 MEQ PO PACK
20.0000 meq | PACK | Freq: Once | ORAL | Status: AC
  Administered 2022-06-26: 20 meq
  Filled 2022-06-26: qty 1

## 2022-06-26 MED ORDER — FREE WATER
100.0000 mL | Status: DC
  Administered 2022-06-26 – 2022-06-29 (×18): 100 mL

## 2022-06-26 MED ORDER — SENNOSIDES-DOCUSATE SODIUM 8.6-50 MG PO TABS: 1.0000 | ORAL_TABLET | Freq: Every evening | ORAL | Status: DC | PRN

## 2022-06-26 MED ORDER — SODIUM CHLORIDE 0.9 % IV SOLN
2.0000 g | Freq: Three times a day (TID) | INTRAVENOUS | Status: DC
  Administered 2022-06-26 – 2022-06-27 (×3): 2 g via INTRAVENOUS
  Filled 2022-06-26 (×3): qty 12.5

## 2022-06-26 MED ORDER — POTASSIUM CHLORIDE 20 MEQ PO PACK
40.0000 meq | PACK | Freq: Once | ORAL | Status: AC
  Administered 2022-06-26: 40 meq
  Filled 2022-06-26: qty 2

## 2022-06-26 NOTE — Progress Notes (Signed)
Pharmacy Antibiotic Note  [name] Oneal is a 77 y.o. male admitted on 06/20/2022 with pneumonia.  Pharmacy has been consulted for cefepime dosing.  PCT 0.31, WBC up to 13. Scr 1.1 (CrCl 71 mL/min). Afebrile. Worsening oxygenation overnight.   Plan: Start cefepime 2g IV every 8 hours Monitor renal fx, cx results, clinical pic, and LOT   Height: 5\' 10"  (177.8 cm) Weight: 107.5 kg (236 lb 15.9 oz) IBW/kg (Calculated) : 73  Temp (24hrs), Avg:98.8 F (37.1 C), Min:96.2 F (35.7 C), Max:100.6 F (38.1 C)  Recent Labs  Lab 06/22/22 0442 06/23/22 0321 06/24/22 0050 06/25/22 0541 06/26/22 0655  WBC 11.5* 13.2* 9.7 9.2 13.7*  CREATININE 1.04 1.05 0.99 1.00 1.10    Estimated Creatinine Clearance: 71.2 mL/min (by C-G formula based on SCr of 1.1 mg/dL).    No Known Allergies  Antimicrobials this admission: Cefepime 3/29 >>   Dose adjustments this admission: N/A  Microbiology results: 3/23 Resp panel: neg  3/24 MRSA PCR: neg  Thank you for allowing pharmacy to be a part of this patient's care.  Antonietta Jewel, PharmD, [place] Clinical Pharmacist  Phone: [phone] 06/26/2022 11:54 AM  Please check AMION for all [place] phone numbers After 10:00 PM, call [place] [phone]

## 2022-06-26 NOTE — TOC Progression Note (Addendum)
Transition of Care [facility]) - Progression Note    Patient Details  Name: [name] Oneal MRN: [id number] Date of Birth: 1945-04-16  [facility] of [facility]) CM/SW Contact  Erenest Rasher, RN Phone Number: 06/26/2022, 5:01 PM  Clinical Narrative:    Pt currently on IV abx, CIWA, and Bipap. Lives at home with SO, Melody. Attempted call to SO and left message for return call.         Expected Discharge Plan and Services   Waiting PT/OT recommendations for home.   Social Determinants of Health (SDOH) Interventions SDOH Screenings   Food Insecurity: No Food Insecurity (06/20/2022)  Housing: Low Risk  (06/20/2022)  Transportation Needs: No Transportation Needs (06/20/2022)  Utilities: Not At Risk (06/20/2022)  Tobacco Use: High Risk (06/20/2022)    Readmission Risk Interventions     No data to display

## 2022-06-26 NOTE — Progress Notes (Signed)
NAME:  [name] Oneal, MRN:  [id number], DOB:  Jul 29, 1945, LOS: 6 ADMISSION DATE:  06/20/2022, CONSULTATION DATE:  06/21/2022 REFERRING MD:  Frann Rider, MD CHIEF COMPLAINT:  Alcohol Withdrawal   History of Present Illness:  [name] Oneal is a 77 year old male, daily smoker with CAD who was admitted 3/23 for chest pain noted to have an NSTEMI. 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 He has received 12mg  of IV ativan since night shift and remains very fidgety and restless. CIWA scores are 18-21.   PCCM consulted for concern of progressive alcohol withdrawal.   Worsening oxygenation overnight, now on 80%  Pertinent Medical History:   Past Medical History:  Diagnosis Date   Alcoholic ([place])    Hypertension   CAD s/p DES in 2003 Tobacco Use  Significant Hospital Events: Including procedures, antibiotic start and stop dates in addition to other pertinent events   3/23 admitted for NSTEMI 3/24 PCCM consulted for alcohol withdrawal 3/27 Agitated overnight requiring restraints. Remains on Precedex. Phenobarb ongoing. Cortrak today. 3/28 minimally responsive. Worsening FiO2, off precedex, more hypertensive, prn BiPAP, lasix, tapering withdrawal meds   Interim History / Subjective:  Tmax 100.6, WBC up, PCT trending up More responsive today but still not f/c, poor cough clearance  Diuresed 3.6L, stable sCr  Objective   Blood pressure (!) 156/60, pulse 82, temperature 98.9 F (37.2 C), temperature source Oral, resp. rate (!) 26, height 5\' 10"  (1.778 m), weight 107.5 kg, SpO2 95 %.    Vent Mode: PCV;BIPAP FiO2 (%):  [50 %-80 %] 60 % Set Rate:  [12 bmp] 12 bmp PEEP:  [8 cmH20] 8 cmH20 Pressure Support:  [8 cmH20] 8 cmH20   Intake/Output Summary (Last 24 hours) at 06/26/2022 1017 Last data filed at 06/26/2022 0900 Gross per 24 hour  Intake 1483.03 ml  Output 3500 ml  Net -2016.97 ml   Filed Weights    06/22/22 0600 06/25/22 0500 06/26/22 0500  Weight: 105.3 kg 109.5 kg 107.5 kg   Physical Examination: General:  AoC ill appearing elderly male sitting upright in bed in NAD HEENT: MM pink/moist, pupils 4/r, cortrak Neuro: moans and opens eyes with stimuli, not f/c or tracking, MAE spont CV: rr, NSR with some ectopy, +1 dp PULM:  mild abd breathing off BiPAP, diffuse scattered rhonchi, weak cough GI: obese, soft, +bs, foley  Extremities: warm/dry, trace LE edema Skin: no rashes   UOP 3.6L/ 24rs Net even  Wts 109.5> 107.5kg   Assessment & Plan:   Acute Alcohol Withdrawal/ DT ETOH abuse  B12 Deficiency - s/p B12 IM injection Folate Deficiency - phenobarb taper started 3/28 - decrease librium today - clonidine also added 3/28, increased today - CIWA - cont thiamine, folate, MV - cessation counseling/TOC consult when appropriate - aspiration, seizure precautions   Acute Hypoxemic Respiratory Failure 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  Improving O2 needs, goal > 88% - starting cefepime for suspected aspiration given day 6 of hospitalization.  Can consider de-escalating to ceftriaxone if continues to improve.   - PCT in am, trending WBC/ fever curve - prn CXR - NTS prn  - aspiration precautions - cont BD - Pulmonary hygiene- as able  - outpatient pulmonary/sleep study recommended - Encourage smoking cessation, weight loss when appropriate   Acute toxic/metabolic  encephalopathy - in setting of alcohol withdrawal and B12 deficiency - ammonia up> continue lactulose, having good stools today.  Trend ammonia - abx/ infectious care as above  - consider CTH> has been non-focal but consider if any focal changes  - Treat EtOH WD as above, electrolyte derangements  NSTEMI/angina, CAD, HTN - tele monitoring - TTE 3/27> EF 50-55%, mod LVH, G2DD, moderately  reduced RVSF, mod elevated PASP, borderline dilation of ascending aorta, 8mm, IVC dilated, RA ~15 - will need to reach out to Cards when mental status improved and can go for LHC - cont lopressor, lisinopril, ASA, prn hydralazine - increase clonidine to 0.2mg  TID  At risk for malnutrition - TF per cortrak, RD recs   Constipation, resolved - having good Bms now - cont lactulose as above, prn miralax, senokot - mobilize when able  Best Practice (right click and "Reselect all SmartList Selections" daily)   Diet/type: NPO; TF  DVT prophylaxis: LMWH GI prophylaxis: N/A Lines: N/A Foley:  Yes, and it is still needed Code Status:  DNR DNI Last date of multidisciplinary goals of care discussion [3/24]  Jeanie Cooks, daughter, [place], significant other>  [phone]  Tammi updated at bedside 3/29  Critical care time: 38 minutes   The patient is critically ill with multiple organ system failure and requires high complexity decision making for assessment and support, frequent evaluation and titration of therapies, advanced monitoring, review of radiographic studies and interpretation of complex data.   Critical Care Time devoted to patient care services, exclusive of separately billable procedures, described in this note is 33 minutes.  Kennieth Rad, NP  Pulmonary & Critical Care 06/26/22 10:17 AM  Please see [email or web address] for pager details.  From 7A-7P if no response, please call [phone] After hours, please call ELink [phone]

## 2022-06-27 DIAGNOSIS — I200 Unstable angina: Secondary | ICD-10-CM | POA: Diagnosis not present

## 2022-06-27 LAB — POCT I-STAT 7, (LYTES, BLD GAS, ICA,H+H)
Acid-Base Excess: 6 mmol/L — ABNORMAL HIGH (ref 0.0–2.0)
Bicarbonate: 32.7 mmol/L — ABNORMAL HIGH (ref 20.0–28.0)
Calcium, Ion: 1.3 mmol/L (ref 1.15–1.40)
HCT: 41 % (ref 39.0–52.0)
Hemoglobin: 13.9 g/dL (ref 13.0–17.0)
O2 Saturation: 88 %
Patient temperature: 99.5
Potassium: 3.9 mmol/L (ref 3.5–5.1)
Sodium: 148 mmol/L — ABNORMAL HIGH (ref 135–145)
TCO2: 34 mmol/L — ABNORMAL HIGH (ref 22–32)
pCO2 arterial: 55.3 mmHg — ABNORMAL HIGH (ref 32–48)
pH, Arterial: 7.382 (ref 7.35–7.45)
pO2, Arterial: 58 mmHg — ABNORMAL LOW (ref 83–108)

## 2022-06-27 LAB — RENAL FUNCTION PANEL
Albumin: 3 g/dL — ABNORMAL LOW (ref 3.5–5.0)
Anion gap: 14 (ref 5–15)
BUN: 40 mg/dL — ABNORMAL HIGH (ref 8–23)
CO2: 29 mmol/L (ref 22–32)
Calcium: 9.4 mg/dL (ref 8.9–10.3)
Chloride: 103 mmol/L (ref 98–111)
Creatinine, Ser: 1.09 mg/dL (ref 0.61–1.24)
GFR, Estimated: >60 mL/min (ref >60)
Glucose, Bld: 147 mg/dL — ABNORMAL HIGH (ref 70–99)
Phosphorus: 3.6 mg/dL (ref 2.5–4.6)
Potassium: 4 mmol/L (ref 3.5–5.1)
Sodium: 146 mmol/L — ABNORMAL HIGH (ref 135–145)

## 2022-06-27 LAB — GLUCOSE, CAPILLARY
Glucose-Capillary: 119 mg/dL — ABNORMAL HIGH (ref 70–99)
Glucose-Capillary: 135 mg/dL — ABNORMAL HIGH (ref 70–99)
Glucose-Capillary: 150 mg/dL — ABNORMAL HIGH (ref 70–99)
Glucose-Capillary: 161 mg/dL — ABNORMAL HIGH (ref 70–99)
Glucose-Capillary: 165 mg/dL — ABNORMAL HIGH (ref 70–99)

## 2022-06-27 LAB — CBC
HCT: 44.1 % (ref 39.0–52.0)
Hemoglobin: 14.2 g/dL (ref 13.0–17.0)
MCH: 34.7 pg — ABNORMAL HIGH (ref 26.0–34.0)
MCHC: 32.2 g/dL (ref 30.0–36.0)
MCV: 107.8 fL — ABNORMAL HIGH (ref 80.0–100.0)
Platelets: 217 10*3/uL (ref 150–400)
RBC: 4.09 MIL/uL — ABNORMAL LOW (ref 4.22–5.81)
RDW: 15.7 % — ABNORMAL HIGH (ref 11.5–15.5)
WBC: 14.7 10*3/uL — ABNORMAL HIGH (ref 4.0–10.5)
nRBC: 0 % (ref 0.0–0.2)

## 2022-06-27 LAB — PROCALCITONIN: Procalcitonin: 0.25 ng/mL

## 2022-06-27 LAB — MAGNESIUM: Magnesium: 2.3 mg/dL (ref 1.7–2.4)

## 2022-06-27 LAB — AMMONIA: Ammonia: 36 umol/L — ABNORMAL HIGH (ref 9–35)

## 2022-06-27 MED ORDER — SODIUM CHLORIDE 0.9 % IV SOLN
2.0000 g | INTRAVENOUS | Status: DC
  Administered 2022-06-27: 2 g via INTRAVENOUS
  Filled 2022-06-27: qty 20

## 2022-06-27 MED ORDER — METOLAZONE 5 MG PO TABS
5.0000 mg | ORAL_TABLET | Freq: Once | ORAL | Status: AC
  Administered 2022-06-27: 5 mg
  Filled 2022-06-27: qty 1

## 2022-06-27 NOTE — Progress Notes (Signed)
Pt has been resting comfortably on bipap throughout the night without any signs of distress. All checks completed and breathing treatments given. Pt will continue to be monitored.

## 2022-06-27 NOTE — Progress Notes (Signed)
NAME:  [name] Oneal, MRN:  [id number], DOB:  Feb 15, 1946, LOS: 7 ADMISSION DATE:  06/20/2022, CONSULTATION DATE:  06/21/2022 REFERRING MD:  Frann Rider, MD CHIEF COMPLAINT:  Alcohol Withdrawal   History of Present Illness:  [name] Oneal is a 77 year old male, daily smoker with CAD who was admitted 3/23 for chest pain noted to have an NSTEMI. 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 He has received 12mg  of IV ativan since night shift and remains very fidgety and restless. CIWA scores are 18-21.   PCCM consulted for concern of progressive alcohol withdrawal.   Worsening oxygenation overnight, now on 80%  Pertinent Medical History:   Past Medical History:  Diagnosis Date   Alcoholic ([place])    Hypertension   CAD s/p DES in 2003 Tobacco Use  Significant Hospital Events: Including procedures, antibiotic start and stop dates in addition to other pertinent events   3/23 admitted for NSTEMI 3/24 PCCM consulted for alcohol withdrawal 3/27 Agitated overnight requiring restraints. Remains on Precedex. Phenobarb ongoing. Cortrak today.  Interim History / Subjective:  Still pretty passed out. Opens eyes briefly to sternal rub then falls back asleep.  Objective   Blood pressure (!) 148/73, pulse 78, temperature 99.2 F (37.3 C), temperature source Axillary, resp. rate 18, height 5\' 10"  (1.778 m), weight 107.5 kg, SpO2 96 %.    Vent Mode: BIPAP FiO2 (%):  [40 %] 40 % Set Rate:  [12 bmp] 12 bmp PEEP:  [8 cmH20] 8 cmH20   Intake/Output Summary (Last 24 hours) at 06/27/2022 1012 Last data filed at 06/27/2022 0900 Gross per 24 hour  Intake 1059.98 ml  Output 1795 ml  Net -735.02 ml    Filed Weights   06/22/22 0600 06/25/22 0500 06/26/22 0500  Weight: 105.3 kg 109.5 kg 107.5 kg   Physical Examination: No distress BIPAP in place good seal Distant heart sounds due to body habitus, no evert wheezing Ext  warm Facial plethora Grimaces to pain but not following commands or withdrawing consistently  Assessment & Plan:   Acute Alcohol Withdrawal/ DT ETOH abuse  B12 Deficiency - s/p B12 IM injection Folate Deficiency Persistent hypoactive encephalopathy  over past 48h, think meds finally caught up with him Acute Hypoxemic Respiratory Failure 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  Cardiology following, appreciate assistance> need to reach out to Cards when mental status improved and can go for LHC - cont lopressor, lisinopril, ASA, prn hydralazine - LHC once mental status improves  At risk for malnutrition - TF per cortrak, RD recs   Constipation- resolved  Best Practice (right click and "Reselect all SmartList Selections" daily)   Diet/type: NPO; TF  DVT prophylaxis: LMWH GI prophylaxis: N/A Lines: N/A Foley:  N/A Code Status:  DNR DNI, okay to continue bipap Last date of multidisciplinary goals of care discussion [3/24]  Jeanie Cooks, daughter, [place], significant other>  [phone]  No family at bedside.  Pending update 3/30  The patient is critically ill with multiple organ system failure and requires  high complexity decision making for assessment and support, frequent evaluation and titration of therapies, advanced monitoring, review of radiographic studies and interpretation of complex data.   Critical Care Time devoted to patient care services, exclusive of separately billable procedures, described in this note is 33  minutes.  Candee Furbish, MD [facility] 06/27/22 10:12 AM  Please see [email or web address] for pager details.  From 7A-7P if no response, please call [phone] After hours, please call ELink [phone]

## 2022-06-28 ENCOUNTER — Inpatient Hospital Stay (HOSPITAL_COMMUNITY): Payer: No Typology Code available for payment source

## 2022-06-28 DIAGNOSIS — I200 Unstable angina: Secondary | ICD-10-CM | POA: Diagnosis not present

## 2022-06-28 LAB — RENAL FUNCTION PANEL
Albumin: 2.9 g/dL — ABNORMAL LOW (ref 3.5–5.0)
Anion gap: 12 (ref 5–15)
BUN: 44 mg/dL — ABNORMAL HIGH (ref 8–23)
CO2: 32 mmol/L (ref 22–32)
Calcium: 9.2 mg/dL (ref 8.9–10.3)
Chloride: 103 mmol/L (ref 98–111)
Creatinine, Ser: 1.02 mg/dL (ref 0.61–1.24)
GFR, Estimated: >60 mL/min (ref >60)
Glucose, Bld: 154 mg/dL — ABNORMAL HIGH (ref 70–99)
Phosphorus: 3.5 mg/dL (ref 2.5–4.6)
Potassium: 4 mmol/L (ref 3.5–5.1)
Sodium: 147 mmol/L — ABNORMAL HIGH (ref 135–145)

## 2022-06-28 LAB — CBC
HCT: 45.7 % (ref 39.0–52.0)
Hemoglobin: 14.3 g/dL (ref 13.0–17.0)
MCH: 33.8 pg (ref 26.0–34.0)
MCHC: 31.3 g/dL (ref 30.0–36.0)
MCV: 108 fL — ABNORMAL HIGH (ref 80.0–100.0)
Platelets: 233 10*3/uL (ref 150–400)
RBC: 4.23 MIL/uL (ref 4.22–5.81)
RDW: 15.7 % — ABNORMAL HIGH (ref 11.5–15.5)
WBC: 13.6 10*3/uL — ABNORMAL HIGH (ref 4.0–10.5)
nRBC: 0 % (ref 0.0–0.2)

## 2022-06-28 LAB — PROCALCITONIN: Procalcitonin: 0.14 ng/mL

## 2022-06-28 LAB — GLUCOSE, CAPILLARY
Glucose-Capillary: 131 mg/dL — ABNORMAL HIGH (ref 70–99)
Glucose-Capillary: 141 mg/dL — ABNORMAL HIGH (ref 70–99)
Glucose-Capillary: 149 mg/dL — ABNORMAL HIGH (ref 70–99)
Glucose-Capillary: 155 mg/dL — ABNORMAL HIGH (ref 70–99)
Glucose-Capillary: 169 mg/dL — ABNORMAL HIGH (ref 70–99)
Glucose-Capillary: 180 mg/dL — ABNORMAL HIGH (ref 70–99)
Glucose-Capillary: 181 mg/dL — ABNORMAL HIGH (ref 70–99)

## 2022-06-28 MED ORDER — PIPERACILLIN-TAZOBACTAM 3.375 G IVPB
3.3750 g | Freq: Three times a day (TID) | INTRAVENOUS | Status: AC
  Administered 2022-06-28 – 2022-07-03 (×15): 3.375 g via INTRAVENOUS
  Filled 2022-06-28 (×15): qty 50

## 2022-06-28 MED ORDER — AMIODARONE HCL IN DEXTROSE 360-4.14 MG/200ML-% IV SOLN
INTRAVENOUS | Status: AC
  Filled 2022-06-28: qty 400

## 2022-06-28 NOTE — Progress Notes (Signed)
NAME:  [name] Oneal, MRN:  [id number], DOB:  27-Nov-1945, LOS: 8 ADMISSION DATE:  06/20/2022, CONSULTATION DATE:  06/21/2022 REFERRING MD:  Frann Rider, MD CHIEF COMPLAINT:  Alcohol Withdrawal   History of Present Illness:  [name] Oneal is a 77 year old male, daily smoker with CAD who was admitted 3/23 for chest pain noted to have an NSTEMI. 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 He has received 12mg  of IV ativan since night shift and remains very fidgety and restless. CIWA scores are 18-21.   PCCM consulted for concern of progressive alcohol withdrawal.   Worsening oxygenation overnight, now on 80%  Pertinent Medical History:   Past Medical History:  Diagnosis Date   Alcoholic ([place])    Hypertension   CAD s/p DES in 2003 Tobacco Use  Significant Hospital Events: Including procedures, antibiotic start and stop dates in addition to other pertinent events   3/23 admitted for NSTEMI 3/24 PCCM consulted for alcohol withdrawal 3/27 Agitated overnight requiring restraints. Remains on Precedex. Phenobarb ongoing. Cortrak today.  Interim History / Subjective:  More awake today. Mumbling.  Objective   Blood pressure (!) 160/65, pulse 91, temperature (!) 101.5 F (38.6 C), temperature source Axillary, resp. rate (!) 25, height 5\' 10"  (1.778 m), weight 107.5 kg, SpO2 92 %.    Vent Mode: BIPAP FiO2 (%):  [40 %] 40 % Set Rate:  [12 bmp] 12 bmp PEEP:  [8 cmH20] 8 cmH20   Intake/Output Summary (Last 24 hours) at 06/28/2022 0946 Last data filed at 06/28/2022 0800 Gross per 24 hour  Intake 860 ml  Output 1850 ml  Net -990 ml    Filed Weights   06/26/22 0500 06/27/22 0712 06/28/22 0600  Weight: 107.5 kg 105.7 kg 107.5 kg   Physical Examination: No distress Gurgling breath sounds Yells when I cause pain on R but no movement Does withdraw on L Pupils equal Abd soft  Sodium up Cr ok CBC  stable  Assessment & Plan:   Acute Alcohol Withdrawal/ DT ETOH abuse  B12 Deficiency - s/p B12 IM injection Folate Deficiency Persistent hypoactive encephalopathy  Improved.  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  Cardiology following, appreciate assistance> need to reach out to Cards when mental status improved and can go for LHC At risk for malnutrition Ongoing aspiration and fevers- will switch ceftriaxone to zosyn Constipation- resolved  - Head CT stat - hold all sedating meds - thiamine/folate - wean FiO2 as able > 88% - Bronchodilators Garlon Hatchet, Yupelri) - Pulmonary hygiene - Encourage smoking cessation, weight loss when appropriate  - cont lopressor, lisinopril, ASA, prn hydralazine - abx to zosyn x 5 days - LHC if mental status improves - TF per cortrak, RD recs   Best Practice (right click and "Reselect all SmartList Selections" daily)   Diet/type: NPO; TF  DVT prophylaxis: LMWH GI prophylaxis: N/A Lines: N/A Foley:  N/A Code Status:  DNR DNI, okay to continue bipap Last date of multidisciplinary goals of care discussion [3/24]  Jeanie Cooks, daughter, [place], significant other>  [phone]  No family at bedside.  Pending update 3/31  The patient is critically ill with multiple organ system failure and requires high complexity decision making for assessment and support, frequent evaluation and titration of therapies, advanced monitoring, review of radiographic studies and interpretation of complex data.   Critical Care Time devoted to patient care services, exclusive of separately billable  procedures, described in this note is 48minutes.  Candee Furbish, MD [facility] 06/28/22 9:46 AM  Please see [email or web address] for pager details.  From 7A-7P if no response, please call [phone] After hours, please call ELink [phone]

## 2022-06-29 DIAGNOSIS — J690 Pneumonitis due to inhalation of food and vomit: Secondary | ICD-10-CM | POA: Diagnosis not present

## 2022-06-29 DIAGNOSIS — I214 Non-ST elevation (NSTEMI) myocardial infarction: Secondary | ICD-10-CM | POA: Diagnosis not present

## 2022-06-29 DIAGNOSIS — E870 Hyperosmolality and hypernatremia: Secondary | ICD-10-CM | POA: Diagnosis not present

## 2022-06-29 DIAGNOSIS — F10931 Alcohol use, unspecified with withdrawal delirium: Secondary | ICD-10-CM

## 2022-06-29 DIAGNOSIS — G9341 Metabolic encephalopathy: Secondary | ICD-10-CM | POA: Diagnosis not present

## 2022-06-29 HISTORY — DX: Alcohol use, unspecified with withdrawal delirium: F10.931

## 2022-06-29 HISTORY — DX: Pneumonitis due to inhalation of food and vomit: J69.0

## 2022-06-29 LAB — CBC
HCT: 44.4 % (ref 39.0–52.0)
Hemoglobin: 13.7 g/dL (ref 13.0–17.0)
MCH: 33.8 pg (ref 26.0–34.0)
MCHC: 30.9 g/dL (ref 30.0–36.0)
MCV: 109.6 fL — ABNORMAL HIGH (ref 80.0–100.0)
Platelets: 227 10*3/uL (ref 150–400)
RBC: 4.05 MIL/uL — ABNORMAL LOW (ref 4.22–5.81)
RDW: 15.6 % — ABNORMAL HIGH (ref 11.5–15.5)
WBC: 14.4 10*3/uL — ABNORMAL HIGH (ref 4.0–10.5)
nRBC: 0 % (ref 0.0–0.2)

## 2022-06-29 LAB — GLUCOSE, CAPILLARY
Glucose-Capillary: 133 mg/dL — ABNORMAL HIGH (ref 70–99)
Glucose-Capillary: 145 mg/dL — ABNORMAL HIGH (ref 70–99)
Glucose-Capillary: 154 mg/dL — ABNORMAL HIGH (ref 70–99)
Glucose-Capillary: 159 mg/dL — ABNORMAL HIGH (ref 70–99)
Glucose-Capillary: 164 mg/dL — ABNORMAL HIGH (ref 70–99)
Glucose-Capillary: 175 mg/dL — ABNORMAL HIGH (ref 70–99)

## 2022-06-29 LAB — RENAL FUNCTION PANEL
Albumin: 2.7 g/dL — ABNORMAL LOW (ref 3.5–5.0)
Anion gap: 15 (ref 5–15)
BUN: 54 mg/dL — ABNORMAL HIGH (ref 8–23)
CO2: 32 mmol/L (ref 22–32)
Calcium: 8.9 mg/dL (ref 8.9–10.3)
Chloride: 100 mmol/L (ref 98–111)
Creatinine, Ser: 1.2 mg/dL (ref 0.61–1.24)
GFR, Estimated: >60 mL/min (ref >60)
Glucose, Bld: 137 mg/dL — ABNORMAL HIGH (ref 70–99)
Phosphorus: 4.6 mg/dL (ref 2.5–4.6)
Potassium: 3.9 mmol/L (ref 3.5–5.1)
Sodium: 147 mmol/L — ABNORMAL HIGH (ref 135–145)

## 2022-06-29 LAB — MAGNESIUM: Magnesium: 2.5 mg/dL — ABNORMAL HIGH (ref 1.7–2.4)

## 2022-06-29 MED ORDER — FREE WATER
200.0000 mL | Status: DC
  Administered 2022-06-29 – 2022-07-01 (×11): 200 mL

## 2022-06-29 MED ORDER — POTASSIUM CHLORIDE 20 MEQ PO PACK
20.0000 meq | PACK | Freq: Once | ORAL | Status: AC
  Administered 2022-06-29: 20 meq
  Filled 2022-06-29: qty 1

## 2022-06-29 NOTE — Progress Notes (Signed)
Cardiology Progress Note  Patient ID: [name] Oneal MRN: [id number] DOB: June 18, 1945 Date of Encounter: 06/29/2022  Primary Cardiologist: Minus Breeding, MD  Subjective   Chief Complaint: Confused  HPI: Currently being treated in the ICU for alcohol withdrawal.  Confused.  Not answering questions appropriately.  He is alert and awake however not responding appropriately.  ROS:  All other ROS reviewed and negative. Pertinent positives noted in the HPI.     Inpatient Medications  Scheduled Meds:  arformoterol  15 mcg Nebulization BID   aspirin  81 mg Per Tube Daily   atorvastatin  80 mg Per Tube Daily   Chlorhexidine Gluconate Cloth  6 each Topical Daily   cloNIDine  0.2 mg Per Tube TID   enoxaparin (LOVENOX) injection  40 mg Subcutaneous Q24H   feeding supplement (PROSource TF20)  60 mL Per Tube QID   folic acid  1 mg Per Tube Daily   free water  100 mL Per Tube Q4H   ipratropium-albuterol  3 mL Nebulization Q4H   lactulose  10 g Per Tube BID   lisinopril  20 mg Per Tube Daily   metoprolol tartrate  25 mg Per Tube BID   multivitamin with minerals  1 tablet Oral Daily   nicotine  21 mg Transdermal Daily   mouth rinse  15 mL Mouth Rinse 4 times per day   revefenacin  175 mcg Nebulization Daily   sodium chloride flush  3 mL Intravenous Q12H   thiamine  100 mg Per Tube Daily   Continuous Infusions:  sodium chloride Stopped (06/24/22 1543)   amiodarone     feeding supplement (VITAL AF 1.2 CAL) 40 mL/hr at 06/29/22 0800   piperacillin-tazobactam (ZOSYN)  IV Stopped (06/29/22 0710)   PRN Meds: sodium chloride, albuterol, amiodarone, hydrALAZINE, nitroGLYCERIN, mouth rinse, senna-docusate, sodium chloride flush   Vital Signs   Vitals:   06/29/22 0742 06/29/22 0752 06/29/22 0802 06/29/22 0805  BP:   (!) 155/70   Pulse:  86 84   Resp:  17 (!) 21   Temp:    98.9 F (37.2 C)  TempSrc:    Oral  SpO2: 96% 94% 93%   Weight:      Height:        Intake/Output Summary  (Last 24 hours) at 06/29/2022 0858 Last data filed at 06/29/2022 0800 Gross per 24 hour  Intake 478.72 ml  Output 1015 ml  Net -536.28 ml      06/29/2022    5:00 AM 06/28/2022    6:00 AM 06/27/2022    7:12 AM  Last 3 Weights  Weight (lbs) 231 lb 11.3 oz 236 lb 15.9 oz 233 lb 0.4 oz  Weight (kg) 105.1 kg 107.5 kg 105.7 kg      Telemetry  Overnight telemetry shows SR 80s with PVCs, which I personally reviewed.   ECG  The most recent ECG shows sinus rhythm heart rate 71, right bundle branch block with PACs, which I personally reviewed.   Physical Exam   Vitals:   06/29/22 0742 06/29/22 0752 06/29/22 0802 06/29/22 0805  BP:   (!) 155/70   Pulse:  86 84   Resp:  17 (!) 21   Temp:    98.9 F (37.2 C)  TempSrc:    Oral  SpO2: 96% 94% 93%   Weight:      Height:        Intake/Output Summary (Last 24 hours) at 06/29/2022 0858 Last data filed at 06/29/2022 0800 Gross  per 24 hour  Intake 478.72 ml  Output 1015 ml  Net -536.28 ml       06/29/2022    5:00 AM 06/28/2022    6:00 AM 06/27/2022    7:12 AM  Last 3 Weights  Weight (lbs) 231 lb 11.3 oz 236 lb 15.9 oz 233 lb 0.4 oz  Weight (kg) 105.1 kg 107.5 kg 105.7 kg    Body mass index is 33.25 kg/m.  General: Ill-appearing, confused Head: Atraumatic, normal size  Eyes: PEERLA, EOMI  Neck: Supple, no JVD Endocrine: No thryomegaly Cardiac: Normal S1, S2; RRR; no murmurs, rubs, or gallops Lungs: Diminished breath sounds bilaterally Abd: Soft, nontender, no hepatomegaly  Ext: Trace edema Musculoskeletal: No deformities, BUE and BLE strength normal and equal Skin: Warm and dry, no rashes   Neuro: Awake, drowsy not answering questions appropriately, not following commands  Labs  High Sensitivity Troponin:   Recent Labs  Lab 06/20/22 0245 06/20/22 0430  TROPONINIHS 319* 379*     Cardiac EnzymesNo results for input(s): "TROPONINI" in the last 168 hours. No results for input(s): "TROPIPOC" in the last 168 hours.  Chemistry Recent  Labs  Lab 06/25/22 0541 06/25/22 1104 06/27/22 0141 06/27/22 1135 06/28/22 0229 06/29/22 0556  NA 139   < > 146* 148* 147* 147*  K 4.0   < > 4.0 3.9 4.0 3.9  CL 103   < > 103  --  103 100  CO2 29   < > 29  --  32 32  GLUCOSE 115*   < > 147*  --  154* 137*  BUN 27*   < > 40*  --  44* 54*  CREATININE 1.00   < > 1.09  --  1.02 1.20  CALCIUM 8.6*   < > 9.4  --  9.2 8.9  PROT 5.9*  --   --   --   --   --   ALBUMIN 3.1*  --  3.0*  --  2.9* 2.7*  AST 22  --   --   --   --   --   ALT 32  --   --   --   --   --   ALKPHOS 44  --   --   --   --   --   BILITOT 1.2  --   --   --   --   --   GFRNONAA >60   < > >60  --  >60 >60  ANIONGAP 7   < > 14  --  12 15   < > = values in this interval not displayed.    Hematology Recent Labs  Lab 06/27/22 0141 06/27/22 1135 06/28/22 0229 06/29/22 0556  WBC 14.7*  --  13.6* 14.4*  RBC 4.09*  --  4.23 4.05*  HGB 14.2 13.9 14.3 13.7  HCT 44.1 41.0 45.7 44.4  MCV 107.8*  --  108.0* 109.6*  MCH 34.7*  --  33.8 33.8  MCHC 32.2  --  31.3 30.9  RDW 15.7*  --  15.7* 15.6*  PLT 217  --  233 227   BNPNo results for input(s): "BNP", "PROBNP" in the last 168 hours.  DDimer No results for input(s): "DDIMER" in the last 168 hours.   Radiology  CT HEAD WO CONTRAST (5MM)  Result Date: 06/28/2022 CLINICAL DATA:  Follow-up stroke. Not moving right side. Mental status changes. EXAM: CT HEAD WITHOUT CONTRAST TECHNIQUE: Contiguous axial images were obtained from the base of  the skull through the vertex without intravenous contrast. RADIATION DOSE REDUCTION: This exam was performed according to the departmental dose-optimization program which includes automated exposure control, adjustment of the mA and/or kV according to patient size and/or use of iterative reconstruction technique. COMPARISON:  None Available. FINDINGS: Brain: The study suffers from some motion degradation. There is age related volume loss without subjective lobar predominance. No focal  abnormality is seen affecting the brainstem. 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 Cerebral hemispheres show an old right frontal cortical and subcortical infarction and chronic small-vessel ischemic changes of the white matter. No sign of acute infarction, mass lesion, hemorrhage, hydrocephalus or extra-axial collection. Vascular: There is atherosclerotic calcification of the major vessels at the base of the brain. Skull: Negative Sinuses/Orbits: Clear/normal Other: None IMPRESSION: No acute CT finding. 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 Chronic small-vessel ischemic changes of the white matter. Electronically Signed   By: Nelson Chimes M.D.   On: 06/28/2022 14:18    Cardiac Studies  TTE 06/24/2022  1. Left ventricular ejection fraction, by estimation, is 50 to 55%. The  left ventricle has low normal function. The left ventricle demonstrates  global hypokinesis. There is moderate left ventricular hypertrophy. Left  ventricular diastolic parameters are   consistent with Grade II diastolic dysfunction (pseudonormalization).   2. Right ventricular systolic function is moderately reduced. The right  ventricular size is moderately enlarged. There is moderately elevated  pulmonary artery systolic pressure.   3. The mitral valve is normal in structure. No evidence of mitral valve  regurgitation. No evidence of mitral stenosis.   4. The aortic valve is normal in structure. Aortic valve regurgitation is  not visualized. No aortic stenosis is present.   5. There is borderline dilatation of the ascending aorta, measuring 36  mm.   6. The inferior vena cava is dilated in size with <50% respiratory  variability, suggesting right atrial pressure of 15 mmHg.   Patient Profile  [name] Oneal is a 77 y.o. male with alcohol abuse, CAD who was admitted on 06/20/2022 with chest pain and non-STEMI.  Course has been  complicated by alcohol withdrawal.  Assessment & Plan   # Acute encephalopathy secondary to alcohol withdrawal -Remains confused.  Currently not a candidate for invasive angiography.  At some point we will reconsider this.  However at this time he is not a candidate for this.  # Non-STEMI -Initially admitted for non-STEMI.  Left heart cath delayed due to alcohol withdrawal.  Currently quite encephalopathic. -At this time we will hold.  Cardiology will follow along to determine when he is a candidate for this. -He has completed 48 hours of heparin. -On aspirin 81 mg daily.  On Lipitor 80 mg daily. -On metoprolol tartrate 25 mg twice daily. -For now continue with medical management.  Unclear if he will be a good candidate for invasive angiography.  Given that he is an alcoholic and here with alcohol withdrawal we may just manage his CAD medically.  LVEF is normal by echo.     For questions or updates, please contact [place] Please consult [email or web address] for contact info under        Signed, [place] T. Audie Box, MD, [place]  06/29/2022 8:58 AM

## 2022-06-29 NOTE — Progress Notes (Signed)
NAME:  Guenther Geiling, MRN:  [id number], DOB:  [birth year], LOS: 9 ADMISSION DATE:  06/20/2022, CONSULTATION DATE:  06/21/2022 REFERRING MD:  Frann Rider, MD CHIEF COMPLAINT:  Alcohol Withdrawal   History of Present Illness:  Dio Bethell is a 77 year old male, daily smoker with CAD who was admitted 3/23 for chest pain noted to have an NSTEMI. 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 He has received 12mg  of IV ativan since night shift and remains very fidgety and restless. CIWA scores are 18-21.   PCCM consulted for concern of progressive alcohol withdrawal.   Worsening oxygenation overnight, now on 80%  Pertinent Medical History:   Past Medical History:  Diagnosis Date   Alcoholic ([place])    Hypertension   CAD s/p DES in 2003 Tobacco Use  Significant Hospital Events: Including procedures, antibiotic start and stop dates in addition to other pertinent events   3/23 admitted for NSTEMI 3/24 PCCM consulted for alcohol withdrawal 3/27 Agitated overnight requiring restraints. Remains on Precedex. Phenobarb ongoing. Cortrak today. 4/1 remains encephalopathic, though calmer and off precedex  Interim History / Subjective:  Awakens to voice, groaning to questions, not following commands Off precedex Cards waiting until mental status improves to pursue L heart cath  Objective   Blood pressure (!) 152/92, pulse 91, temperature 98.9 F (37.2 C), temperature source Oral, resp. rate 16, height 5\' 10"  (1.778 m), weight 105.1 kg, SpO2 96 %.    Vent Mode: BIPAP;PCV FiO2 (%):  [40 %] 40 % Set Rate:  [12 bmp] 12 bmp PEEP:  [8 cmH20] 8 cmH20   Intake/Output Summary (Last 24 hours) at 06/29/2022 0948 Last data filed at 06/29/2022 0800 Gross per 24 hour  Intake 478.72 ml  Output 915 ml  Net -436.28 ml    Filed Weights   06/27/22 0712 06/28/22 0600 06/29/22 0500  Weight: 105.7 kg 107.5 kg 105.1 kg   General:   chronically and acutely ill-appearing M sleeping in bed in no acute distress HEENT: MM pink/moist, sclera anicteric Neuro: opens eyes to voice, groans to questions, not moving extremities to command  CV: s1s2 rrr, no m/r/g PULM:  decreased air entry bilateral bases without significant rhonchi or wheezing  GI: soft, non-tender, mildly distended Extremities: warm/dry, no edema  Skin: no rashes or lesions   Assessment & Plan:   Acute Alcohol Withdrawal/ DT ETOH abuse  B12 Deficiency - Folate Deficiency Persistent hypoactive encephalopathy  R-sided neglect -calmer today and off precedex, but still not following commands and is encephalopathic -not moving any extremities to command for me, CTH showed old R-sided strokes, consider MRI if mental status is not improving, though is out of the window for any acute inventions for new CVA  -continue folic acid, MV, thiamine supplementation     Acute Hypoxemic Respiratory Failure 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 Tobacco abuse  ?OSA - wean FiO2 as able > 88% - Bronchodilators Garlon Hatchet, Yupelri) - Pulmonary hygiene - Encourage smoking cessation, weight loss when appropriate    NSTEMI/angina, CAD, HTN TTE 3/27> EF 50-55%, mod LVH, G2DD, moderately reduced RVSF, mod elevated PASP, borderline dilation of ascending aorta, 40mm, IVC dilated, RA ~15.  -Cardiology following, appreciate assistance, possible LHC when mental status improves - cont lopressor, lisinopril, ASA, prn hydralazine  At risk for malnutrition Ongoing aspiration and fevers -continue zosyn - TF per cortrak, RD recs   Best Practice (right click and "Reselect all SmartList Selections"  daily)   Diet/type: NPO; TF  DVT prophylaxis: LMWH GI prophylaxis: N/A Lines: N/A Foley:  N/A Code Status:  DNR DNI, okay to continue bipap Last date of multidisciplinary goals of care discussion [3/24]  Jeanie Cooks, daughter, [place], significant other>   [phone]  Son updated at the bedside 4/1  CRITICAL CARE Performed by: Otilio Carpen Lynton Crescenzo   Total critical care time: 35 minutes  Critical care time was exclusive of separately billable procedures and treating other patients.  Critical care was necessary to treat or prevent imminent or life-threatening deterioration.  Critical care was time spent personally by me on the following activities: development of treatment plan with patient and/or surrogate as well as nursing, discussions with consultants, evaluation of patient's response to treatment, examination of patient, obtaining history from patient or surrogate, ordering and performing treatments and interventions, ordering and review of laboratory studies, ordering and review of radiographic studies, pulse oximetry and re-evaluation of patient's condition.   Otilio Carpen Zelda Reames, PA-C [facility] See Amion for pager If no response to pager , please call [phone] until 7pm After 7:00 pm call Elink  [phone]?[place]

## 2022-06-29 NOTE — Progress Notes (Signed)
RT removed BIPAP and placed pt on 8L [place] HFNC at this time. Pt's spo2 is 94%. Pt is tolerating settings well. RN is aware. RT will monitor as needed.

## 2022-06-30 ENCOUNTER — Inpatient Hospital Stay (HOSPITAL_COMMUNITY): Payer: No Typology Code available for payment source

## 2022-06-30 ENCOUNTER — Other Ambulatory Visit (HOSPITAL_COMMUNITY): Payer: PRIVATE HEALTH INSURANCE

## 2022-06-30 DIAGNOSIS — J9602 Acute respiratory failure with hypercapnia: Secondary | ICD-10-CM | POA: Diagnosis not present

## 2022-06-30 DIAGNOSIS — J9601 Acute respiratory failure with hypoxia: Secondary | ICD-10-CM | POA: Diagnosis not present

## 2022-06-30 DIAGNOSIS — E440 Moderate protein-calorie malnutrition: Secondary | ICD-10-CM | POA: Diagnosis not present

## 2022-06-30 DIAGNOSIS — G9341 Metabolic encephalopathy: Secondary | ICD-10-CM | POA: Diagnosis not present

## 2022-06-30 DIAGNOSIS — G9340 Encephalopathy, unspecified: Secondary | ICD-10-CM

## 2022-06-30 DIAGNOSIS — J189 Pneumonia, unspecified organism: Secondary | ICD-10-CM

## 2022-06-30 DIAGNOSIS — F10931 Alcohol use, unspecified with withdrawal delirium: Secondary | ICD-10-CM | POA: Diagnosis not present

## 2022-06-30 DIAGNOSIS — I214 Non-ST elevation (NSTEMI) myocardial infarction: Secondary | ICD-10-CM | POA: Diagnosis not present

## 2022-06-30 HISTORY — DX: Pneumonia, unspecified organism: J18.9

## 2022-06-30 LAB — CBC
HCT: 44.5 % (ref 39.0–52.0)
Hemoglobin: 13.7 g/dL (ref 13.0–17.0)
MCH: 33.8 pg (ref 26.0–34.0)
MCHC: 30.8 g/dL (ref 30.0–36.0)
MCV: 109.9 fL — ABNORMAL HIGH (ref 80.0–100.0)
Platelets: 266 10*3/uL (ref 150–400)
RBC: 4.05 MIL/uL — ABNORMAL LOW (ref 4.22–5.81)
RDW: 15.4 % (ref 11.5–15.5)
WBC: 16.2 10*3/uL — ABNORMAL HIGH (ref 4.0–10.5)
nRBC: 0 % (ref 0.0–0.2)

## 2022-06-30 LAB — POCT I-STAT 7, (LYTES, BLD GAS, ICA,H+H)
Acid-Base Excess: 10 mmol/L — ABNORMAL HIGH (ref 0.0–2.0)
Bicarbonate: 35.5 mmol/L — ABNORMAL HIGH (ref 20.0–28.0)
Calcium, Ion: 1.19 mmol/L (ref 1.15–1.40)
HCT: 40 % (ref 39.0–52.0)
Hemoglobin: 13.6 g/dL (ref 13.0–17.0)
O2 Saturation: 89 %
Patient temperature: 98.7
Potassium: 3.2 mmol/L — ABNORMAL LOW (ref 3.5–5.1)
Sodium: 148 mmol/L — ABNORMAL HIGH (ref 135–145)
TCO2: 37 mmol/L — ABNORMAL HIGH (ref 22–32)
pCO2 arterial: 51.8 mmHg — ABNORMAL HIGH (ref 32–48)
pH, Arterial: 7.444 (ref 7.35–7.45)
pO2, Arterial: 56 mmHg — ABNORMAL LOW (ref 83–108)

## 2022-06-30 LAB — MAGNESIUM: Magnesium: 2.7 mg/dL — ABNORMAL HIGH (ref 1.7–2.4)

## 2022-06-30 LAB — BASIC METABOLIC PANEL
Anion gap: 15 (ref 5–15)
BUN: 59 mg/dL — ABNORMAL HIGH (ref 8–23)
CO2: 30 mmol/L (ref 22–32)
Calcium: 8.7 mg/dL — ABNORMAL LOW (ref 8.9–10.3)
Chloride: 103 mmol/L (ref 98–111)
Creatinine, Ser: 1.28 mg/dL — ABNORMAL HIGH (ref 0.61–1.24)
GFR, Estimated: 58 mL/min — ABNORMAL LOW (ref >60)
Glucose, Bld: 157 mg/dL — ABNORMAL HIGH (ref 70–99)
Potassium: 3.8 mmol/L (ref 3.5–5.1)
Sodium: 148 mmol/L — ABNORMAL HIGH (ref 135–145)

## 2022-06-30 LAB — GLUCOSE, CAPILLARY
Glucose-Capillary: 164 mg/dL — ABNORMAL HIGH (ref 70–99)
Glucose-Capillary: 140 mg/dL — ABNORMAL HIGH (ref 70–99)
Glucose-Capillary: 138 mg/dL — ABNORMAL HIGH (ref 70–99)
Glucose-Capillary: 145 mg/dL — ABNORMAL HIGH (ref 70–99)
Glucose-Capillary: 172 mg/dL — ABNORMAL HIGH (ref 70–99)

## 2022-06-30 LAB — MRSA NEXT GEN BY PCR, NASAL: MRSA by PCR Next Gen: NOT DETECTED

## 2022-06-30 LAB — AMMONIA: Ammonia: 37 umol/L — ABNORMAL HIGH (ref 9–35)

## 2022-06-30 MED ORDER — LACTATED RINGERS IV BOLUS
1000.0000 mL | Freq: Once | INTRAVENOUS | Status: AC
  Administered 2022-06-30: 1000 mL via INTRAVENOUS

## 2022-06-30 MED ORDER — IOHEXOL 350 MG/ML SOLN
60.0000 mL | Freq: Once | INTRAVENOUS | Status: AC | PRN
  Administered 2022-06-30: 60 mL via INTRAVENOUS

## 2022-06-30 MED ORDER — CYANOCOBALAMIN 1000 MCG/ML IJ SOLN
1000.0000 ug | Freq: Once | INTRAMUSCULAR | Status: AC
  Administered 2022-06-30: 1000 ug via INTRAMUSCULAR
  Filled 2022-06-30: qty 1

## 2022-06-30 MED ORDER — FUROSEMIDE 10 MG/ML IJ SOLN
40.0000 mg | Freq: Four times a day (QID) | INTRAMUSCULAR | Status: DC
  Administered 2022-06-30: 40 mg via INTRAVENOUS
  Filled 2022-06-30: qty 4

## 2022-06-30 MED ORDER — VITAL AF 1.2 CAL PO LIQD
1000.0000 mL | ORAL | Status: DC
  Administered 2022-06-30 – 2022-07-05 (×7): 1000 mL
  Filled 2022-06-30 (×5): qty 1000

## 2022-06-30 MED ORDER — VALPROIC ACID 250 MG PO CAPS
250.0000 mg | ORAL_CAPSULE | Freq: Two times a day (BID) | ORAL | Status: DC
  Filled 2022-06-30: qty 1

## 2022-06-30 MED ORDER — WHITE PETROLATUM EX OINT
TOPICAL_OINTMENT | Freq: Two times a day (BID) | CUTANEOUS | Status: DC
  Administered 2022-07-01 – 2022-07-12 (×6): 0.2 via TOPICAL
  Filled 2022-06-30 (×8): qty 28.35

## 2022-06-30 MED ORDER — VITAMIN B-12 1000 MCG PO TABS
500.0000 ug | ORAL_TABLET | Freq: Every day | ORAL | Status: DC
  Administered 2022-07-01: 500 ug via ORAL
  Filled 2022-06-30: qty 1

## 2022-06-30 MED ORDER — BANATROL TF EN LIQD
60.0000 mL | Freq: Two times a day (BID) | ENTERAL | Status: DC
  Administered 2022-06-30 – 2022-07-05 (×12): 60 mL
  Filled 2022-06-30 (×12): qty 60

## 2022-06-30 MED ORDER — INSULIN ASPART 100 UNIT/ML IJ SOLN
0.0000 [IU] | INTRAMUSCULAR | Status: DC
  Administered 2022-06-30: 2 [IU] via SUBCUTANEOUS
  Administered 2022-07-01: 1 [IU] via SUBCUTANEOUS
  Administered 2022-07-01: 3 [IU] via SUBCUTANEOUS
  Administered 2022-07-01 (×4): 2 [IU] via SUBCUTANEOUS
  Administered 2022-07-01 – 2022-07-02 (×2): 1 [IU] via SUBCUTANEOUS
  Administered 2022-07-02 (×3): 2 [IU] via SUBCUTANEOUS
  Administered 2022-07-02: 1 [IU] via SUBCUTANEOUS
  Administered 2022-07-02: 2 [IU] via SUBCUTANEOUS
  Administered 2022-07-03: 1 [IU] via SUBCUTANEOUS
  Administered 2022-07-03 (×3): 2 [IU] via SUBCUTANEOUS
  Administered 2022-07-04 (×3): 1 [IU] via SUBCUTANEOUS
  Administered 2022-07-04: 2 [IU] via SUBCUTANEOUS
  Administered 2022-07-04 – 2022-07-05 (×4): 1 [IU] via SUBCUTANEOUS
  Administered 2022-07-05: 3 [IU] via SUBCUTANEOUS
  Administered 2022-07-05 (×2): 1 [IU] via SUBCUTANEOUS
  Administered 2022-07-06: 2 [IU] via SUBCUTANEOUS
  Administered 2022-07-06 – 2022-07-07 (×4): 1 [IU] via SUBCUTANEOUS
  Administered 2022-07-07 (×2): 2 [IU] via SUBCUTANEOUS
  Administered 2022-07-07 – 2022-07-09 (×3): 1 [IU] via SUBCUTANEOUS

## 2022-06-30 MED ORDER — VALPROIC ACID 250 MG/5ML PO SOLN
250.0000 mg | Freq: Two times a day (BID) | ORAL | Status: DC
  Administered 2022-06-30 – 2022-07-03 (×6): 250 mg
  Filled 2022-06-30 (×6): qty 5

## 2022-06-30 NOTE — Progress Notes (Signed)
Pt remained stable throughout the night without use of BIPAP, and HFNC ([place]) only with no drop in oxygen saturations or increased WOB.

## 2022-06-30 NOTE — Consult Note (Addendum)
[facility] Nurse Consult Note: Reason for Consult: Consult requested for several wounds. Pt is critically ill with multiple systemic factors which can impair healing. He is on a low airloss mattress to reduce pressure. He is currently using nasal cannula for oxygen delivery.  Wound type: Bridge of nose with Unstageable pressure injury; 95% dry tightly adhered eschar, 5% red, 3X2cm Left heel with dark purple Deep tissue pressure injury; 3X3cm intact blood filled blister Right heel with dark purple Deep tissue pressure injury; 5X5 cm intact blood filled blister Right posterior thigh with dark purple Deep tissue pressure injury; 5X1 cm intact blood filled blister Pressure Injury POA: No Dressing procedure/placement/frequency: Topical treatment orders provided for bedside nurses to perform as follows: 1. Foam dressing to bilat heels and right posterior thigh, change Q 3 days or PRN soiling 2. Vaseline to bridge of nose BID, leave open to air Deep tissue wounds and the Unstageable pressure injury to the nose are high risk to evolve into full thickness tissue loss eventually. [facility] team will reassesses locations next week to determine if a change in the plan of care is indicated at that time. Thank-you,  [name] Girt MSN, [place], [place], [place], [place]

## 2022-06-30 NOTE — Progress Notes (Signed)
Attempted to call daughter Cherre Huger to update her. No answer. I updated his s/o Melody via phone.  Julian Hy, DO 06/30/22 5:11 PM [facility]

## 2022-06-30 NOTE — Progress Notes (Signed)
Increased OP from chest tubes, Hb <7. Getting pRBC, FFP per Dr. Tenny Craw. Monitoring chest tube output, hold on rapid wean for now.  Julian Hy, DO 06/30/22 5:34 PM [facility]  For contact information, see Amion. If no response to pager, please call PCCM consult pager. After hours, 7PM- 7AM, please call Elink.

## 2022-06-30 NOTE — Consult Note (Signed)
Neurology Consultation  CC: code stroke  History is obtained from: chart and attending MD  HPI: [name] Oneal is a 77 y.o. male with PMH of CAD, HTN, ETOH abuse, daily smoker x 60 years, admitted with chest pain, found to have STEMI. Patient was on heparin gtt last week and went through extensive DTs with aggressive behavior. Tapered off of precedex, phenobarb and librium tapers in the last couple of days.  Patient is currently on aspirin.  Patient's status abruptly changed around 1540, with patient becoming unresponsive to voice, pain, deep noxious stimuli. Code stroke was immediately called. Initial NIH of 30.  During transition to CT, patient finally responded to noxious stimuli, confused, shouting with dysarthria. Per attending MD, this level of dysarthria was his baseline while inpatient.  Delay in getting Cta due to issue getting reliable IV access, IV team present.   LKW: [id number], although this is his last known well with his presentation recently in the unit while going through extensive DTs. Last known normal would truly be last week, as patient has been agitated/aggressive and requiring sedation. mRS: 0 tNKASE: No, outside of window with true last known well Thrombectomy: No, no LVO.  NIHSS:   Code stroke cancelled due to negative imaging, inability to get MRI due to patient's agitation, etiology presenting as metabolic/non-neurologic deficit.   ROS:  Unable to obtain due to altered mental status.   Past Medical History:  Diagnosis Date   Alcoholic ([place])    Hypertension     History reviewed. No pertinent family history.   Social History:  reports that he has been smoking. He has never used smokeless tobacco. He reports current alcohol use. No history on file for drug use.  Prior to Admission medications   Not on File   No current facility-administered medications on file prior to encounter.   No current outpatient medications on file prior to encounter.     Exam: Current vital signs: BP (!) 134/55 (BP Location: Right Arm)   Pulse 69   Temp 98.9 F (37.2 C) (Oral)   Resp (!) 23   Ht 5\' 10"  (1.778 m)   Wt 104.8 kg   SpO2 95%   BMI 33.15 kg/m    Physical Exam  Constitutional: Appears well-developed and well-nourished.  Psych: Affect appropriate to situation Eyes: No scleral injection HENT: No OP obstrucion Head: Normocephalic.  Cardiovascular: Normal rate and regular rhythm.  Respiratory: Effort normal, non-labored breathing GI: Soft.  No distension. There is no tenderness.  Skin: WDI  Neuro: Mental Status: Patient is now responsive to noxious stimuli. Does not follow commands or answer questions/cooperate with exam. Dysarthria present.  Cranial Nerves: Uncooperative with exam.   II: PERRL. Unable to assess VF.   III,IV, VI: EOMI. Anisocoria with left larger. V: Uncooperative with sensation exam VII: Facial movement is symmetric.  VIII: hearing is intact to voice X: Uvula elevates symmetrically XI: Uncooperative/unable to assess with shoulder shrug [id number] to assess tongue protrusion Motor: Tone is normal. Bulk is normal. 5/5 strength was present in all four extremities.  Sensory: Withdraws to pain in all extremities Deep Tendon Reflexes: No signs of hyperreflexia  Cerebellar: FNF and HKS: uncooperative/unable to assess   I have reviewed labs in epic and the pertinent results are:  Lab Results  Component Value Date/Time   CHOL 187 06/23/2022 03:21 AM    Results for orders placed or performed during the hospital encounter of 06/20/22 (from the past 48 hour(s))  Glucose, capillary  Status: Abnormal   Collection Time: 06/28/22  4:34 PM  Result Value Ref Range   Glucose-Capillary 155 (H) 70 - 99 mg/dL    Comment: Glucose reference range applies only to samples taken after fasting for at least 8 hours.  Glucose, capillary     Status: Abnormal   Collection Time: 06/28/22  8:28 PM  Result  Value Ref Range   Glucose-Capillary 181 (H) 70 - 99 mg/dL    Comment: Glucose reference range applies only to samples taken after fasting for at least 8 hours.  Glucose, capillary     Status: Abnormal   Collection Time: 06/28/22 11:48 PM  Result Value Ref Range   Glucose-Capillary 169 (H) 70 - 99 mg/dL    Comment: Glucose reference range applies only to samples taken after fasting for at least 8 hours.  Glucose, capillary     Status: Abnormal   Collection Time: 06/29/22  3:44 AM  Result Value Ref Range   Glucose-Capillary 164 (H) 70 - 99 mg/dL    Comment: Glucose reference range applies only to samples taken after fasting for at least 8 hours.  CBC     Status: Abnormal   Collection Time: 06/29/22  5:56 AM  Result Value Ref Range   WBC 14.4 (H) 4.0 - 10.5 K/uL   RBC 4.05 (L) 4.22 - 5.81 MIL/uL   Hemoglobin 13.7 13.0 - 17.0 g/dL   HCT 44.4 39.0 - 52.0 %   MCV 109.6 (H) 80.0 - 100.0 fL   MCH 33.8 26.0 - 34.0 pg   MCHC 30.9 30.0 - 36.0 g/dL   RDW 15.6 (H) 11.5 - 15.5 %   Platelets 227 150 - 400 K/uL   nRBC 0.0 0.0 - 0.2 %    Comment: Performed at [facility] [address] [place], [place] [address]  Renal function panel     Status: Abnormal   Collection Time: 06/29/22  5:56 AM  Result Value Ref Range   Sodium 147 (H) 135 - 145 mmol/L   Potassium 3.9 3.5 - 5.1 mmol/L   Chloride 100 98 - 111 mmol/L   CO2 32 22 - 32 mmol/L   Glucose, Bld 137 (H) 70 - 99 mg/dL    Comment: Glucose reference range applies only to samples taken after fasting for at least 8 hours.   BUN 54 (H) 8 - 23 mg/dL   Creatinine, Ser 1.20 0.61 - 1.24 mg/dL   Calcium 8.9 8.9 - 10.3 mg/dL   Phosphorus 4.6 2.5 - 4.6 mg/dL   Albumin 2.7 (L) 3.5 - 5.0 g/dL   GFR, Estimated >60 >60 mL/min    Comment: (NOTE) Calculated using the CKD-EPI Creatinine Equation (2021)    Anion gap 15 5 - 15    Comment: Performed at [facility] [address], [place], [place] [address]  Magnesium     Status: Abnormal    Collection Time: 06/29/22  5:56 AM  Result Value Ref Range   Magnesium 2.5 (H) 1.7 - 2.4 mg/dL    Comment: Performed at [facility] [address], [place], [place] [address]  Glucose, capillary     Status: Abnormal   Collection Time: 06/29/22  8:02 AM  Result Value Ref Range   Glucose-Capillary 133 (H) 70 - 99 mg/dL    Comment: Glucose reference range applies only to samples taken after fasting for at least 8 hours.  Glucose, capillary     Status: Abnormal   Collection Time: 06/29/22  11:59 AM  Result Value Ref Range   Glucose-Capillary 145 (H) 70 - 99 mg/dL    Comment: Glucose reference range applies only to samples taken after fasting for at least 8 hours.  Glucose, capillary     Status: Abnormal   Collection Time: 06/29/22  4:42 PM  Result Value Ref Range   Glucose-Capillary 175 (H) 70 - 99 mg/dL    Comment: Glucose reference range applies only to samples taken after fasting for at least 8 hours.  Glucose, capillary     Status: Abnormal   Collection Time: 06/29/22  8:07 PM  Result Value Ref Range   Glucose-Capillary 159 (H) 70 - 99 mg/dL    Comment: Glucose reference range applies only to samples taken after fasting for at least 8 hours.  Glucose, capillary     Status: Abnormal   Collection Time: 06/29/22 11:49 PM  Result Value Ref Range   Glucose-Capillary 154 (H) 70 - 99 mg/dL    Comment: Glucose reference range applies only to samples taken after fasting for at least 8 hours.  CBC     Status: Abnormal   Collection Time: 06/30/22  1:10 AM  Result Value Ref Range   WBC 16.2 (H) 4.0 - 10.5 K/uL   RBC 4.05 (L) 4.22 - 5.81 MIL/uL   Hemoglobin 13.7 13.0 - 17.0 g/dL   HCT 44.5 39.0 - 52.0 %   MCV 109.9 (H) 80.0 - 100.0 fL   MCH 33.8 26.0 - 34.0 pg   MCHC 30.8 30.0 - 36.0 g/dL   RDW 15.4 11.5 - 15.5 %   Platelets 266 150 - 400 K/uL   nRBC 0.0 0.0 - 0.2 %    Comment: Performed at [facility], [place] [address] [place], [place] [id number]  Basic metabolic panel      Status: Abnormal   Collection Time: 06/30/22  1:10 AM  Result Value Ref Range   Sodium 148 (H) 135 - 145 mmol/L   Potassium 3.8 3.5 - 5.1 mmol/L   Chloride 103 98 - 111 mmol/L   CO2 30 22 - 32 mmol/L   Glucose, Bld 157 (H) 70 - 99 mg/dL    Comment: Glucose reference range applies only to samples taken after fasting for at least 8 hours.   BUN 59 (H) 8 - 23 mg/dL   Creatinine, Ser 1.28 (H) 0.61 - 1.24 mg/dL   Calcium 8.7 (L) 8.9 - 10.3 mg/dL   GFR, Estimated 58 (L) >60 mL/min    Comment: (NOTE) Calculated using the CKD-EPI Creatinine Equation (2021)    Anion gap 15 5 - 15    Comment: Performed at [facility] [address] [place], [place] [address]  Magnesium     Status: Abnormal   Collection Time: 06/30/22  1:10 AM  Result Value Ref Range   Magnesium 2.7 (H) 1.7 - 2.4 mg/dL    Comment: Performed at [facility] [address] [place], [place] [address]  Glucose, capillary     Status: Abnormal   Collection Time: 06/30/22  4:06 AM  Result Value Ref Range   Glucose-Capillary 164 (H) 70 - 99 mg/dL    Comment: Glucose reference range applies only to samples taken after fasting for at least 8 hours.  Glucose, capillary     Status: Abnormal   Collection Time: 06/30/22  8:12 AM  Result Value Ref Range   Glucose-Capillary 140 (H) 70 - 99 mg/dL    Comment: Glucose reference range  applies only to samples taken after fasting for at least 8 hours.  MRSA Next Gen by PCR, Nasal     Status: None   Collection Time: 06/30/22 11:30 AM   Specimen: Nasal Mucosa; Nasal Swab  Result Value Ref Range   MRSA by PCR Next Gen NOT DETECTED NOT DETECTED    Comment: (NOTE) The GeneXpert MRSA Assay (FDA approved for NASAL specimens only), is one component of a comprehensive MRSA colonization surveillance program. It is not intended to diagnose MRSA infection nor to guide or monitor treatment for MRSA infections. Test performance is not FDA approved in patients less than 77  years old. Performed at [facility], [place] [address] [place], [place] [address]   Glucose, capillary     Status: Abnormal   Collection Time: 06/30/22 11:35 AM  Result Value Ref Range   Glucose-Capillary 138 (H) 70 - 99 mg/dL    Comment: Glucose reference range applies only to samples taken after fasting for at least 8 hours.  Glucose, capillary     Status: Abnormal   Collection Time: 06/30/22  3:54 PM  Result Value Ref Range   Glucose-Capillary 145 (H) 70 - 99 mg/dL    Comment: Glucose reference range applies only to samples taken after fasting for at least 8 hours.    DG CHEST PORT 1 VIEW  Result Date: 06/30/2022 CLINICAL DATA:  Fever EXAM: PORTABLE CHEST 1 VIEW COMPARISON:  Previous studies including the examination of 06/25/2022 FINDINGS: Transverse diameter of heart is increased. There is improvement in aeration in right lower lung field. Small residual patchy infiltrates are seen in right mid and right lower lung fields. There is increased density in left lower lung field which has not changed. There is blunting of left lateral CP angle. There is no pneumothorax. NG tube is noted traversing the esophagus. IMPRESSION: Cardiomegaly. There are no signs of alveolar pulmonary edema. There is almost complete clearing of infiltrates in right mid and right lower lung fields. Increased density in left lower lung field may suggest pleural effusion and underlying atelectasis/pneumonia. Electronically Signed   By: Elmer Picker M.D.   On: 06/30/2022 08:57     I have reviewed the images obtained:  CT head:  No evidence of an acute intracranial abnormality within limitations of motion. 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 Patient is currently on Zosyn  Impression:  Acute metabolic encephalopathy in the setting of  etoh withdraw versus Hypercapnic Encephalopathy secondary to acute respiratory failure  Recommendations: - ABG - Ammonia Level - UA  - EEG  - Electrolyte monitoring, replacement - B12, Folate, Thiamine levels and replacement - Continue ASA, Statin for secondary stroke prevention - Normotensive blood pressure goals - PT/OT/SLP - ETOH cessation education when able to participate    Pt seen by Neuro NP/APP and later by MD. Note/plan to be edited by MD as needed.    Otelia Santee, DNP, AGACNP-BC [facility] Please use AMION for pager and EPIC for messaging   I have seen the patient reviewed the above note.  Initially he was only minimally responsive to noxious stimulus, but once he did arouse, he was able to withdrawal to noxious stimulation bilaterally, he was agitated and did not answer orientation questions but did lambast this examiner and the medical team with slightly dysarthric but cogent speech.  He  did not cooperate with field testing, and given his agitation I was concerned that checking blink to threat might elicit a physical response but he did fixate and track.  The etiology of his sudden unresponsiveness is slightly unclear, I think hypercarbia would be one possibility with an increased respiratory rate following repeated noxious stimulation finally blowing off the CO2.  Seizure would be another possibility.  I think stroke is considerably less likely given the nonfocal nature of his presentation.  Overall my suspicion is more for a toxic/metabolic cause rather than seizure or structural brain disease.  Neurology will follow.  Roland Rack, MD [facility] [phone]  If 7pm- 7am, please page neurology on call as listed in [place].

## 2022-06-30 NOTE — Progress Notes (Signed)
At 1546 Pt. Became unresponsive to verbal and painful stimulus with no other vital sign changes. Code stroke activated at 1551. Last well know 1545. Taken for stat head CT and CTA. Pt returned to baseline at 1630.Code stroke deactivated.

## 2022-06-30 NOTE — Progress Notes (Signed)
NAME:  Dimitry Meise, MRN:  [id number], DOB:  09-21-1945, LOS: 2 ADMISSION DATE:  06/20/2022, CONSULTATION DATE:  06/21/2022 REFERRING MD:  Frann Rider, MD CHIEF COMPLAINT:  Alcohol Withdrawal   History of Present Illness:  [name] Oneal is a 77 year old male, daily smoker with CAD who was admitted 3/23 for chest pain noted to have an NSTEMI. 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 He has received 12mg  of IV ativan since night shift and remains very fidgety and restless. CIWA scores are 18-21.   PCCM consulted for concern of progressive alcohol withdrawal.   Worsening oxygenation overnight, now on 80%  Pertinent Medical History:   Past Medical History:  Diagnosis Date   Alcoholic ([place])    Hypertension   CAD s/p DES in 2003 Tobacco Use  Significant Hospital Events: Including procedures, antibiotic start and stop dates in addition to other pertinent events   3/23 admitted for NSTEMI 3/24 PCCM consulted for alcohol withdrawal 3/27 Agitated overnight requiring restraints. Remains on Precedex. Phenobarb ongoing. Cortrak today. 4/1 remains encephalopathic, though calmer and off precedex 4/2 no overnight events, mental status much improved today  Interim History / Subjective:  Calm and interactive, still confused but answering questions and moving all extremities to command  Objective   Blood pressure (!) 132/52, pulse 79, temperature 99.5 F (37.5 C), temperature source Oral, resp. rate (!) 23, height 5\' 10"  (1.778 m), weight 104.8 kg, SpO2 91 %.    Vent Mode: BIPAP;PCV FiO2 (%):  [40 %] 40 % Set Rate:  [12 bmp] 12 bmp PEEP:  [8 cmH20] 8 cmH20   Intake/Output Summary (Last 24 hours) at 06/30/2022 0731 Last data filed at 06/30/2022 0600 Gross per 24 hour  Intake 563.06 ml  Output 1305 ml  Net -741.94 ml    Filed Weights   06/28/22 0600 06/29/22 0500 06/30/22 0500  Weight: 107.5 kg 105.1 kg 104.8 kg    General:  chronically ill-appearing, awake and in no distress HEENT: MM pink/dry, sclera anicteric, pupils equal  Neuro: awake, answering questions, moving all extremities to command, answering yes/no to questions but confused  CV: s1s2 rrr, no m/r/g PULM:  decreased air entry bilateral bases with slight rhonchi R base  GI: soft, non-tender, mildly distended Extremities: warm/dry, no edema  Skin: no rashes or lesions   Assessment & Plan:   Acute Alcohol Withdrawal/ DT ETOH abuse  B12 Deficiency  Folate Deficiency Persistent hypoactive encephalopathy  R-sided neglect -calmer today and more awake, mental status is improving, following commands -CTH showed old R-sided strokes, -continue folic acid, MV, thiamine supplementation -completed phenobarbital and librium, now off precedex  -continue lactluose    Acute Hypoxemic Respiratory Failure 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  -Cardiology following, appreciate assistance, possible LHC when mental status improves - cont lopressor, lisinopril, ASA, prn hydralazine  At risk for malnutrition Ongoing aspiration and fevers -continue zosyn - TF per cortrak, RD recs   Hypernatremia Na still elevated at 148 -  continue FWF  -1L LR, looks slightly volume down and net negative 3L  AKI Creatinine rise today to 1.28 -1L LR, continue to follow BMP  Best Practice (right click and "Reselect all SmartList Selections" daily)   Diet/type: NPO; TF  DVT  prophylaxis: LMWH GI prophylaxis: N/A Lines: N/A Foley:  N/A Code Status:  DNR DNI, okay to continue bipap if needed Last date of multidisciplinary goals of care discussion [3/24]  Jeanie Cooks, daughter, [place], significant other>  [phone]  Son updated at the bedside 4/1, pending 4/2     Otilio Carpen Beaux Wedemeyer, PA-C [facility] See Amion for pager If no response to pager , please call [phone] until 7pm After 7:00 pm call Elink  [phone]?[place]

## 2022-06-30 NOTE — Code Documentation (Signed)
Stroke Response Nurse Documentation Code Documentation  [name] Oneal is a 77 y.o. male admitted to [facility]  on [address] for NSTEMI with past medical hx of hyperglycemia, NSTEMI, DTs, metabolic encephalopathy. On No antithrombotic. Code stroke was activated by [facility] staff.   Patient on [place] where he was LKW at 1540 and became totally unresponsive, after having been interacting with staff at his baseline during a bath just minutes before.    Stroke team at the bedside after patient activation. Patient to CT with team. NIHSS 30, see documentation for details and code stroke times. Patient with decreased LOC, disoriented, not following commands, bilateral arm weakness, bilateral leg weakness, bilateral decreased sensation, Global aphasia , and dysarthria  on exam. The following imaging was completed:  CT Head and CTA. Patient's exam improved while at CT, now moving all extremities and speaking, though still dysarthric and confused, which is similar to his exams prior to unresponsiveness. Patient is not a candidate for IV Thrombolytic due to no stroke suspected. Patient is not a candidate for IR due to no LVO on imaging per MD.   Care/Plan: cancel code stroke at 1650. Check ABG and ammonia.   Bedside handoff with RN Carloyn Manner.    [place]  Stroke Response RN

## 2022-06-30 NOTE — Progress Notes (Signed)
Cardiology Progress Note  Patient ID: [name] Oneal MRN: [id number] DOB: 1945/08/18 Date of Encounter: 06/30/2022  Primary Cardiologist: Minus Breeding, MD  Subjective   Chief Complaint: None.   HPI: Remains confused.  Quite encephalopathic.  ROS:  All other ROS reviewed and negative. Pertinent positives noted in the HPI.     Inpatient Medications  Scheduled Meds:  arformoterol  15 mcg Nebulization BID   aspirin  81 mg Per Tube Daily   atorvastatin  80 mg Per Tube Daily   Chlorhexidine Gluconate Cloth  6 each Topical Daily   cloNIDine  0.2 mg Per Tube TID   enoxaparin (LOVENOX) injection  40 mg Subcutaneous Q24H   feeding supplement (PROSource TF20)  60 mL Per Tube QID   folic acid  1 mg Per Tube Daily   free water  200 mL Per Tube Q4H   ipratropium-albuterol  3 mL Nebulization Q4H   lactulose  10 g Per Tube BID   lisinopril  20 mg Per Tube Daily   metoprolol tartrate  25 mg Per Tube BID   multivitamin with minerals  1 tablet Oral Daily   nicotine  21 mg Transdermal Daily   mouth rinse  15 mL Mouth Rinse 4 times per day   revefenacin  175 mcg Nebulization Daily   sodium chloride flush  3 mL Intravenous Q12H   thiamine  100 mg Per Tube Daily   white petrolatum   Topical BID   Continuous Infusions:  sodium chloride Stopped (06/29/22 0838)   feeding supplement (VITAL AF 1.2 CAL) 40 mL/hr at 06/30/22 0800   piperacillin-tazobactam (ZOSYN)  IV 12.5 mL/hr at 06/30/22 0800   PRN Meds: sodium chloride, albuterol, hydrALAZINE, nitroGLYCERIN, mouth rinse, senna-docusate, sodium chloride flush   Vital Signs   Vitals:   06/30/22 0814 06/30/22 0828 06/30/22 0829 06/30/22 0900  BP:    (!) 157/67  Pulse:    86  Resp:    18  Temp: 99.1 F (37.3 C)     TempSrc: Oral     SpO2:  92% 91% 93%  Weight:      Height:        Intake/Output Summary (Last 24 hours) at 06/30/2022 0918 Last data filed at 06/30/2022 0800 Gross per 24 hour  Intake 493.32 ml  Output 1440 ml  Net  -946.68 ml      06/30/2022    5:00 AM 06/29/2022    5:00 AM 06/28/2022    6:00 AM  Last 3 Weights  Weight (lbs) 231 lb 0.7 oz 231 lb 11.3 oz 236 lb 15.9 oz  Weight (kg) 104.8 kg 105.1 kg 107.5 kg      Telemetry  Overnight telemetry shows sinus rhythm 80s, which I personally reviewed.    Physical Exam   Vitals:   06/30/22 0814 06/30/22 0828 06/30/22 0829 06/30/22 0900  BP:    (!) 157/67  Pulse:    86  Resp:    18  Temp: 99.1 F (37.3 C)     TempSrc: Oral     SpO2:  92% 91% 93%  Weight:      Height:        Intake/Output Summary (Last 24 hours) at 06/30/2022 0918 Last data filed at 06/30/2022 0800 Gross per 24 hour  Intake 493.32 ml  Output 1440 ml  Net -946.68 ml       06/30/2022    5:00 AM 06/29/2022    5:00 AM 06/28/2022    6:00 AM  Last 3  Weights  Weight (lbs) 231 lb 0.7 oz 231 lb 11.3 oz 236 lb 15.9 oz  Weight (kg) 104.8 kg 105.1 kg 107.5 kg    Body mass index is 33.15 kg/m.  General: Delirium noted Head: Atraumatic, normal size  Eyes: PEERLA, EOMI  Neck: Supple, JVD 8 to 10 cm of water Endocrine: No thryomegaly Cardiac: Normal S1, S2; RRR; no murmurs, rubs, or gallops Lungs: Rales noted bilaterally Abd: Soft, nontender, no hepatomegaly  Ext: No edema, pulses 2+ Musculoskeletal: No deformities, BUE and BLE strength normal and equal Skin: Warm and dry, no rashes   Neuro: Alert and oriented to person, place, time, and situation, CNII-XII grossly intact, no focal deficits  Psych: Normal mood and affect   Labs  High Sensitivity Troponin:   Recent Labs  Lab 06/20/22 0245 06/20/22 0430  TROPONINIHS 319* 379*     Cardiac EnzymesNo results for input(s): "TROPONINI" in the last 168 hours. No results for input(s): "TROPIPOC" in the last 168 hours.  Chemistry Recent Labs  Lab 06/25/22 0541 06/25/22 1104 06/27/22 0141 06/27/22 1135 06/28/22 0229 06/29/22 0556 06/30/22 0110  NA 139   < > 146*   < > 147* 147* 148*  K 4.0   < > 4.0   < > 4.0 3.9 3.8  CL 103   <  > 103  --  103 100 103  CO2 29   < > 29  --  32 32 30  GLUCOSE 115*   < > 147*  --  154* 137* 157*  BUN 27*   < > 40*  --  44* 54* 59*  CREATININE 1.00   < > 1.09  --  1.02 1.20 1.28*  CALCIUM 8.6*   < > 9.4  --  9.2 8.9 8.7*  PROT 5.9*  --   --   --   --   --   --   ALBUMIN 3.1*  --  3.0*  --  2.9* 2.7*  --   AST 22  --   --   --   --   --   --   ALT 32  --   --   --   --   --   --   ALKPHOS 44  --   --   --   --   --   --   BILITOT 1.2  --   --   --   --   --   --   GFRNONAA >60   < > >60  --  >60 >60 58*  ANIONGAP 7   < > 14  --  12 15 15    < > = values in this interval not displayed.    Hematology Recent Labs  Lab 06/28/22 0229 06/29/22 0556 06/30/22 0110  WBC 13.6* 14.4* 16.2*  RBC 4.23 4.05* 4.05*  HGB 14.3 13.7 13.7  HCT 45.7 44.4 44.5  MCV 108.0* 109.6* 109.9*  MCH 33.8 33.8 33.8  MCHC 31.3 30.9 30.8  RDW 15.7* 15.6* 15.4  PLT 233 227 266   BNPNo results for input(s): "BNP", "PROBNP" in the last 168 hours.  DDimer No results for input(s): "DDIMER" in the last 168 hours.   Radiology  DG CHEST PORT 1 VIEW  Result Date: 06/30/2022 CLINICAL DATA:  Fever EXAM: PORTABLE CHEST 1 VIEW COMPARISON:  Previous studies including the examination of 06/25/2022 FINDINGS: Transverse diameter of heart is increased. There is improvement in aeration in right lower lung field. Small residual patchy infiltrates  are seen in right mid and right lower lung fields. There is increased density in left lower lung field which has not changed. There is blunting of left lateral CP angle. There is no pneumothorax. NG tube is noted traversing the esophagus. IMPRESSION: Cardiomegaly. There are no signs of alveolar pulmonary edema. There is almost complete clearing of infiltrates in right mid and right lower lung fields. Increased density in left lower lung field may suggest pleural effusion and underlying atelectasis/pneumonia. Electronically Signed   By: Elmer Picker M.D.   On: 06/30/2022 08:57    CT HEAD WO CONTRAST (5MM)  Result Date: 06/28/2022 CLINICAL DATA:  Follow-up stroke. Not moving right side. Mental status changes. EXAM: CT HEAD WITHOUT CONTRAST TECHNIQUE: Contiguous axial images were obtained from the base of the skull through the vertex without intravenous contrast. RADIATION DOSE REDUCTION: This exam was performed according to the departmental dose-optimization program which includes automated exposure control, adjustment of the mA and/or kV according to patient size and/or use of iterative reconstruction technique. COMPARISON:  None Available. FINDINGS: Brain: The study suffers from some motion degradation. There is age related volume loss without subjective lobar predominance. No focal abnormality is seen affecting the brainstem. 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 Cerebral hemispheres show an old right frontal cortical and subcortical infarction and chronic small-vessel ischemic changes of the white matter. No sign of acute infarction, mass lesion, hemorrhage, hydrocephalus or extra-axial collection. Vascular: There is atherosclerotic calcification of the major vessels at the base of the brain. Skull: Negative Sinuses/Orbits: Clear/normal Other: None IMPRESSION: No acute CT finding. 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 Chronic small-vessel ischemic changes of the white matter. Electronically Signed   By: Nelson Chimes M.D.   On: 06/28/2022 14:18    Cardiac Studies  TTE 06/24/2022  1. Left ventricular ejection fraction, by estimation, is 50 to 55%. The  left ventricle has low normal function. The left ventricle demonstrates  global hypokinesis. There is moderate left ventricular hypertrophy. Left  ventricular diastolic parameters are   consistent with Grade II diastolic dysfunction (pseudonormalization).   2. Right ventricular systolic function is moderately reduced. The right  ventricular  size is moderately enlarged. There is moderately elevated  pulmonary artery systolic pressure.   3. The mitral valve is normal in structure. No evidence of mitral valve  regurgitation. No evidence of mitral stenosis.   4. The aortic valve is normal in structure. Aortic valve regurgitation is  not visualized. No aortic stenosis is present.   5. There is borderline dilatation of the ascending aorta, measuring 36  mm.   6. The inferior vena cava is dilated in size with <50% respiratory  variability, suggesting right atrial pressure of 15 mmHg.   Patient Profile  [name] Oneal is a 77 y.o. male with alcohol abuse, CAD who was admitted on 06/20/2022 with chest pain and non-STEMI.  Course has been complicated by alcohol withdrawal.   Assessment & Plan   # Acute encephalopathy # Alcohol withdrawal -Per CCM.  Not a good candidate for invasive angiography at this point.  Cardiology to follow along.  # Non-STEMI -Admitted with non-STEMI.  Left heart cath delayed due to alcohol withdrawal.  Currently not a good candidate. -Has completed 48 hours of heparin. -On aspirin.  On Lipitor.  On beta-blocker. -For now medical management.  Not a great candidate for invasive angiography due to delirium tremens. -Cardiology will follow along to determine  when he is a good candidate.  # Volume overload # Pulmonary hypertension -Would likely benefit from diuresis.  Per critical care medicine team.      For questions or updates, please contact [place] Please consult [email or web address] for contact info under        Signed, [place] T. Audie Box, MD, [place]  06/30/2022 9:18 AM

## 2022-06-30 NOTE — Progress Notes (Signed)
eLink Physician-Brief Progress Note Patient Name: Eriksen Sabatelli DOB: [birth year] MRN: [id number]   Date of Service  06/30/2022  HPI/Events of Note  Hyperglycemia - Blood glucose = 172. Patient is NPO. BMI = 33.15.  eICU Interventions  Plan: Q 4 hour sensitive Novolog SSI.      Intervention Category Major Interventions: Hyperglycemia - active titration of insulin therapy  Lysle Dingwall 06/30/2022, 8:53 PM

## 2022-06-30 NOTE — Progress Notes (Addendum)
Called to bedside for non-responsiveness. Last known well about 10 min prior-- around 3:40pm. Was bathed and was talking, acting normally.  Now has reactive pupils but not waking up despite sternal rub.  BP (!) 134/55 (BP Location: Right Arm)   Pulse 69   Temp 98.9 F (37.2 C) (Oral)   Resp (!) 23   Ht 5\' 10"  (1.778 m)   Wt 104.8 kg   SpO2 95%   BMI 33.15 kg/m  Not responsive to noxious stimulation; barely moving his head to the side. Not withdrawing from pain. No resopnse to trapezius squeeze, sternal rub.  Blinks to saline drops to his corneas. PERRL. Breathing normally.    Code stroke activated by RN.  Needs STAT head CT. Not on AC.   Julian Hy, DO 06/30/22 3:54 PM [facility]  For contact information, see Amion. If no response to pager, please call PCCM consult pager. After hours, 7PM- 7AM, please call [place].   STAT head CT and CTA reviewed by Neuro during code stroke. No evidence of acute ICH or LVO. IV team had to place new 20g IV in CT.   Woke up some while downstairs, still has dysarthric speech, but similar exam to this morning- moving extremities, saying short phrases, not oriented. Code stroke terminated. Back in ICU, will collect ammonia and ABG. Airway stable throughout, no need right now for escalation in respiratory support.  Julian Hy, DO 06/30/22 4:52 PM [facility]

## 2022-06-30 NOTE — Care Management Note (Signed)
Went to room.  Patient being cleaned up.  Patient is combative and will need a sedative in order to proceed with EEG.

## 2022-06-30 NOTE — Progress Notes (Signed)
Nutrition Follow-up  DOCUMENTATION CODES:   Non-severe (moderate) malnutrition in context of acute illness/injury (possible component of social/environmental)  INTERVENTION:   Tube Feeding via Cortrak:  Vital AF 1.2 increased to goal of 70 ml/hr D/C Pro-Source TF20 TF provides 2016 kcals, 126 g of protein and 1361 mL of free water  Total free water with increase in TF and current free water flush of 200 mL q 4 hours: 2.5 L free water  Continue MVI with Minerals, Thiamine and Folic Acid  Discussed 123456 level from admission with Dr. Carlis Abbott, pt received one time dose of B12 1000 IM. Given likely significant deficiency in setting of EtOH abuse, recommend resuming supplementation. MD agrees, Pharmacy placing B12 orders  Add Vitamin C 500 mg BID x 30 days and Zinc Sulfate 220 mg daily x 14 hours to promote wound healing  Add Banatrol TF BID   NUTRITION DIAGNOSIS:   Moderate Malnutrition related to acute illness as evidenced by meal completion < 50%, energy intake < 75% for > 7 days, mild muscle depletion, moderate muscle depletion.  Being addressed via TF  GOAL:   Patient will meet greater than or equal to 90% of their needs  Met via TF  MONITOR:   TF tolerance, Labs, Weight trends, Skin  REASON FOR ASSESSMENT:   Consult Enteral/tube feeding initiation and management  ASSESSMENT:   77 y.o. male admits related to chest pain. PMH includes: alcoholic, HTN. Pt is currently receiving medical management related to unstable angina.  3/23 Admitted 3/27 Cortrak placed, TF initiated  Pt remains confused, unable to provide any history to RD on visit today  NPO, noted SLP consulted for possible diet advancement. Noted pt asking MD for liquor to drink  Vital AF 1.2 at 40 ml/hr, Pro-Source TF20 60 mL QID via Cortrak Noted free water flush ordered 200 mL 4 hours yesterday  Noted minimal intake by mouth prior to Cortrak insertion on 3/27  Noted multiple newly documented wounds,  evaluated by WOC RN.high risk to evolve into full thickness tissue loss.  Unstageable PI: bridge of nose from BiPap DTI: bilateral heels, R posterior thigh  RD to adjust nutritional needs and TF prescription accordingly  BUN and Creatinine trending up, hypernatremic. Noted free water flushes added yesterday, Receiving <1 L per day via current TF. Pt received LR bolus 500 mL today  Noted weight trending down, current wt 104.8 kg; highest wt of 109.5 kg on 3/25  Micronutrient Labs 06/20/22:  CRP not available Folic Acid: 3.1 (LOW) Vitamin B12: 130  (LOW - usually elevated in acute illness, suspect significant deficiency present)  Noted RBC low, MCV elevated  Labs: sodium 148 (H), Creatinine 1.28 Meds: MVI with Minerals, Thiamine 123XX123 mg, Folic Acid 1 mg, lactulose   NUTRITION - FOCUSED PHYSICAL EXAM:  Flowsheet Row Most Recent Value  Orbital Region No depletion  Upper Arm Region Unable to assess  Thoracic and Lumbar Region Unable to assess  Buccal Region No depletion  Temple Region No depletion  Clavicle Bone Region Mild depletion  Clavicle and Acromion Bone Region Mild depletion  Scapular Bone Region Mild depletion  Dorsal Hand Mild depletion  Patellar Region Moderate depletion  Anterior Thigh Region Moderate depletion  Posterior Calf Region Moderate depletion  Edema (RD Assessment) Mild  Hair Unable to assess  Eyes Unable to assess  [pt not cooperating]  Mouth Other (Comment)  [tongue very dry, thick dry gunk]  Skin Reviewed  [multiple new PI, ecchymosis]  Nails Reviewed  Diet Order:   Diet Order             Diet NPO time specified  Diet effective now                   EDUCATION NEEDS:   Not appropriate for education at this time  Skin:  Skin Assessment: Skin Integrity Issues: Skin Integrity Issues:: Unstageable, DTI DTI: bilateral heels, right posterior thigh Unstageable: bridge of nose from Bipap  Last BM:  4/2 large type 7 (lactulose for  ammonia)  Height:   Ht Readings from Last 1 Encounters:  06/20/22 5\' 10"  (1.778 m)    Weight:   Wt Readings from Last 1 Encounters:  06/30/22 104.8 kg     BMI:  Body mass index is 33.15 kg/m.  Estimated Nutritional Needs:   Kcal:  2000-2200 kcals  Protein:  115-135 g  Fluid:  >/= 2L   Kerman Passey MS, RDN, LDN, CNSC Registered Dietitian 3 Clinical Nutrition RD Pager and On-Call Pager Number Located in [place]

## 2022-07-01 ENCOUNTER — Inpatient Hospital Stay (HOSPITAL_COMMUNITY): Payer: No Typology Code available for payment source

## 2022-07-01 DIAGNOSIS — N179 Acute kidney failure, unspecified: Secondary | ICD-10-CM

## 2022-07-01 DIAGNOSIS — E440 Moderate protein-calorie malnutrition: Secondary | ICD-10-CM | POA: Diagnosis not present

## 2022-07-01 DIAGNOSIS — R4182 Altered mental status, unspecified: Secondary | ICD-10-CM | POA: Diagnosis not present

## 2022-07-01 DIAGNOSIS — E870 Hyperosmolality and hypernatremia: Secondary | ICD-10-CM | POA: Diagnosis not present

## 2022-07-01 DIAGNOSIS — I214 Non-ST elevation (NSTEMI) myocardial infarction: Secondary | ICD-10-CM | POA: Diagnosis not present

## 2022-07-01 DIAGNOSIS — G9340 Encephalopathy, unspecified: Secondary | ICD-10-CM | POA: Diagnosis not present

## 2022-07-01 LAB — CBC
HCT: 41.7 % (ref 39.0–52.0)
Hemoglobin: 13 g/dL (ref 13.0–17.0)
MCH: 34.5 pg — ABNORMAL HIGH (ref 26.0–34.0)
MCHC: 31.2 g/dL (ref 30.0–36.0)
MCV: 110.6 fL — ABNORMAL HIGH (ref 80.0–100.0)
Platelets: 282 10*3/uL (ref 150–400)
RBC: 3.77 MIL/uL — ABNORMAL LOW (ref 4.22–5.81)
RDW: 15.3 % (ref 11.5–15.5)
WBC: 17.3 10*3/uL — ABNORMAL HIGH (ref 4.0–10.5)
nRBC: 0 % (ref 0.0–0.2)

## 2022-07-01 LAB — BASIC METABOLIC PANEL
Anion gap: 12 (ref 5–15)
BUN: 60 mg/dL — ABNORMAL HIGH (ref 8–23)
CO2: 31 mmol/L (ref 22–32)
Calcium: 8.5 mg/dL — ABNORMAL LOW (ref 8.9–10.3)
Chloride: 104 mmol/L (ref 98–111)
Creatinine, Ser: 1.34 mg/dL — ABNORMAL HIGH (ref 0.61–1.24)
GFR, Estimated: 55 mL/min — ABNORMAL LOW (ref >60)
Glucose, Bld: 166 mg/dL — ABNORMAL HIGH (ref 70–99)
Potassium: 3.3 mmol/L — ABNORMAL LOW (ref 3.5–5.1)
Sodium: 147 mmol/L — ABNORMAL HIGH (ref 135–145)

## 2022-07-01 LAB — MAGNESIUM: Magnesium: 2.8 mg/dL — ABNORMAL HIGH (ref 1.7–2.4)

## 2022-07-01 LAB — GLUCOSE, CAPILLARY
Glucose-Capillary: 145 mg/dL — ABNORMAL HIGH (ref 70–99)
Glucose-Capillary: 146 mg/dL — ABNORMAL HIGH (ref 70–99)
Glucose-Capillary: 154 mg/dL — ABNORMAL HIGH (ref 70–99)
Glucose-Capillary: 155 mg/dL — ABNORMAL HIGH (ref 70–99)
Glucose-Capillary: 156 mg/dL — ABNORMAL HIGH (ref 70–99)
Glucose-Capillary: 163 mg/dL — ABNORMAL HIGH (ref 70–99)
Glucose-Capillary: 174 mg/dL — ABNORMAL HIGH (ref 70–99)
Glucose-Capillary: 229 mg/dL — ABNORMAL HIGH (ref 70–99)

## 2022-07-01 MED ORDER — GERHARDT'S BUTT CREAM
TOPICAL_CREAM | Freq: Two times a day (BID) | CUTANEOUS | Status: DC
  Filled 2022-07-01 (×3): qty 1

## 2022-07-01 MED ORDER — POTASSIUM CHLORIDE 20 MEQ PO PACK
40.0000 meq | PACK | Freq: Once | ORAL | Status: AC
  Administered 2022-07-01: 40 meq
  Filled 2022-07-01: qty 2

## 2022-07-01 MED ORDER — FREE WATER
200.0000 mL | Status: DC
  Administered 2022-07-01 – 2022-07-02 (×12): 200 mL

## 2022-07-01 MED ORDER — LACTULOSE 10 GM/15ML PO SOLN
30.0000 g | Freq: Three times a day (TID) | ORAL | Status: DC
  Administered 2022-07-01 – 2022-07-05 (×14): 30 g
  Filled 2022-07-01 (×16): qty 45

## 2022-07-01 MED ORDER — LINEZOLID 600 MG/300ML IV SOLN
600.0000 mg | Freq: Two times a day (BID) | INTRAVENOUS | Status: AC
  Administered 2022-07-01 – 2022-07-07 (×13): 600 mg via INTRAVENOUS
  Filled 2022-07-01 (×16): qty 300

## 2022-07-01 MED ORDER — VITAMIN B-12 100 MCG PO TABS
500.0000 ug | ORAL_TABLET | Freq: Every day | ORAL | Status: DC
  Administered 2022-07-02 – 2022-07-06 (×5): 500 ug
  Filled 2022-07-01: qty 1
  Filled 2022-07-01 (×2): qty 5
  Filled 2022-07-01 (×2): qty 1

## 2022-07-01 MED ORDER — METOPROLOL TARTRATE 50 MG PO TABS
50.0000 mg | ORAL_TABLET | Freq: Two times a day (BID) | ORAL | Status: DC
  Administered 2022-07-01 – 2022-07-06 (×10): 50 mg
  Filled 2022-07-01 (×10): qty 1

## 2022-07-01 NOTE — Procedures (Signed)
Patient Name: [name] Oneal  MRN: [id number]  Epilepsy Attending: Lora Havens  Referring Physician/Provider: Otelia Santee, NP  Date: 07/01/2022 Duration: 30.58 mins  Patient history: 77yo M with ams. EEG to evaluate for seizure  Level of alertness: Awake  AEDs during EEG study: VPA  Technical aspects: This EEG study was done with scalp electrodes positioned according to the 10-20 International system of electrode placement. Electrical activity was reviewed with band pass filter of 1-70Hz , sensitivity of 7 uV/mm, display speed of 74mm/sec with a 60Hz  notched filter applied as appropriate. EEG data were recorded continuously and digitally stored.  Video monitoring was available and reviewed as appropriate.  Description: The posterior dominant rhythm consists of 8 Hz activity of moderate voltage (25-35 uV) seen predominantly in posterior head regions, symmetric and reactive to eye opening and eye closing. EEG showed intermittent generalized 3 to 6 Hz theta-delta slowing. Physiologic photic driving was not seen during photic stimulation.  Hyperventilation was not performed.     ABNORMALITY - Intermittent slow, generalized  IMPRESSION: This study is suggestive of mild diffuse encephalopathy, nonspecific etiology. No seizures or epileptiform discharges were seen throughout the recording.  [name] Oneal

## 2022-07-01 NOTE — Progress Notes (Signed)
EEG complete - results pending 

## 2022-07-01 NOTE — Progress Notes (Signed)
[facility] Progress Note Patient Name: [name] Oneal DOB: [birth year] MRN: [id number]   Date of Service  07/01/2022  HPI/Events of Note  Hypokalemia - K+ = 3.3 and Creatinine = 1.34. Hypermagnesemia - Mg++ 2.8. Mg++ level only mildly elevated.   eICU Interventions  Plan: Replace K+.  Continue to trend Mg++.     Intervention Category Major Interventions: Electrolyte abnormality - evaluation and management  Monna Crean [place] 07/01/2022, 5:29 AM

## 2022-07-01 NOTE — Progress Notes (Signed)
Subjective: No further events of unresponsiveness  Exam: Vitals:   07/01/22 0801 07/01/22 0807  BP:    Pulse:    Resp:    Temp:  (!) 100.8 F (38.2 C)  SpO2: 95%    Gen: In bed, NAD Resp: non-labored breathing, no acute distress Abd: soft, nt  Neuro: MS: He is awake, conversant but dysarthric, he does not follow commands but this is due to understanding but rather agitation (curses me out repeatedly when I ask him to do something).  He refuses to answer any orientation questions. CN: With the assistance of a nurse holding his arms to prevent him from striking me, I do check blink to threat, but he does not reliably blink from either direction.  He does fixate and track.  Though he does not look to either side very much, he does cross midline in both directions at times.  Face is relatively symmetric. Motor: He moves both upper extremities spontaneously, though does not cooperate with formal testing.  He withdraws to proximal noxious stimulation in the thighs bilaterally. Sensory: As above  Pertinent Labs: Ammonia 37 ABG from yesterday 7.44/51/56  Impression: 77 year old male with acute unresponsiveness during bath.  He was hypoxemic on his ABG, and is possible that this contributed, but by no means clear.  With his improvement and negative EEG, I would not favor empiric antiepileptic medications.  He has been started on Depakene for delirium/behavior and I agree that this could be helpful.  At this point, I think that he has a multifactorial delirium/encephalopathy due to his underlying medical condition coupled with a history of substance abuse and withdrawal.  I suspect that this will gradually improve as his medical condition improves.  Though an MRI could be helpful, my index of suspicion for acute ischemia is low and he would not cooperate with obtaining this study, and I do not think that the risks of intubation/general anesthesia in this gentleman are merited at the current  time.  He is already on antiplatelet therapy as well as statin therapy if there was ischemia.  If he continues to have episodes of unresponsiveness, would again check ABG/CBG during the episode but if unrevealing, could consider empiric antiepileptic therapy or prolonged EEG monitoring though I think this is low yield at this time.  Recommendations: 1) if he is not sleeping at night, could consider low-dose neuroleptic such as Seroquel 25 mg q8pm.  I favor giving this earlier than the 10 PM that QHS implies. 2) lights on during day,  minimize stimulation at night.   3) Neurology will continue to be available on an as-needed basis, please call if he has any further episodes of concern.  Roland Rack, MD [facility] [phone]  If 7pm- 7am, please page neurology on call as listed in [place].

## 2022-07-01 NOTE — Progress Notes (Signed)
Pt did not use BIPAP to rest, maintained normal WOB and oxygen saturations on HFNC (salter).

## 2022-07-01 NOTE — Progress Notes (Signed)
NAME:  Sukhraj Oke, MRN:  [id number], DOB:  10-May-1945, LOS: 37 ADMISSION DATE:  06/20/2022, CONSULTATION DATE:  06/21/2022 REFERRING MD:  Frann Rider, MD CHIEF COMPLAINT:  Alcohol Withdrawal   History of Present Illness:  Danzel Cissell is a 77 year old male, daily smoker with CAD who was admitted 3/23 for chest pain noted to have an NSTEMI. 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 He has received 12mg  of IV ativan since night shift and remains very fidgety and restless. CIWA scores are 18-21.   PCCM consulted for concern of progressive alcohol withdrawal.   Worsening oxygenation overnight, now on 80%  Pertinent Medical History:   Past Medical History:  Diagnosis Date   Alcoholic ([place])    Hypertension   CAD s/p DES in 2003 Tobacco Use  Significant Hospital Events: Including procedures, antibiotic start and stop dates in addition to other pertinent events   3/23 admitted for NSTEMI 3/24 PCCM consulted for alcohol withdrawal 3/27 Agitated overnight requiring restraints. Remains on Precedex. Phenobarb ongoing. Cortrak today. 4/1 remains encephalopathic, though calmer and off precedex 4/2 Mental status initially much improved. Became unresponsive ~1550 prompting Code Stroke. LKW 1540. STAT CT Head/CTA Head/Neck obtained, NAICA/no LVO. Suspect metabolic/non-neurologic deficit. Code Stroke cancelled. 4/3 EEG obtained overnight demonstrating mild diffuse encephalopathy, no seizures. Intermittently agitated/combative with staff. Mitts in place. Failed bedside swallow with PA.  Interim History / Subjective:  No significant events overnight 4/2PM episode of unresponsiveness with Code Stroke called (subsequently cancelled) CT Head NAICA, CTA Head/Neck no LVO EEG negative for seizures Asking for water this AM, I swabbed patient's mouth and attempted supervised sip of water via straw with immediate  coughing/sputtering, c/f aspiration; mouth suctioned and no further water administered Febrile to 100.62F with uptrending WBC Already on Zosyn, Zyvox added for gram positive coverage  Objective:  Blood pressure (!) 133/52, pulse 73, temperature (!) 100.8 F (38.2 C), temperature source Oral, resp. rate (!) 23, height 5\' 10"  (1.778 m), weight 107.2 kg, SpO2 95 %.        Intake/Output Summary (Last 24 hours) at 07/01/2022 0813 Last data filed at 07/01/2022 0600 Gross per 24 hour  Intake 2245.54 ml  Output 1940 ml  Net 305.54 ml    Filed Weights   06/29/22 0500 06/30/22 0500 07/01/22 0500  Weight: 105.1 kg 104.8 kg 107.2 kg   Physical Examination: General: Chronically ill-appearing older man in NAD. HEENT: [place]/AT, anicteric sclera, PERRL, moist mucous membranes; mouth with copious skin/secretion buildup. Neuro:  Awake, intermittently oriented to self/location versus agitated/combative.  Responds to verbal stimuli. Following commands intermittently. Moves all 4 extremities spontaneously. Strength 5/5 in all 4 extremities. CV: RRR, no m/g/r. PULM: Breathing even and unlabored on 10L HFNC ([place]). Lung fields diminished bilaterally at bases. GI: Obese, soft, nontender, mildly distended but compressible. Normoactive bowel sounds. Extremities: Bilateral symmetric 1+ LE edema noted. Skin: Warm/dry, no rashes; reddish facial complexion.  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 EEG negative for seizures. Neuro consulted, signed off 4/3. 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 - Trend WBC, fever curve - F/u Cx data - Continue broad-spectrum antibiotics (Zosyn, Zyvox added 4/3) - Follow CXR  NSTEMI/angina, CAD, HTN TTE 3/27> EF 50-55%, mod LVH, G2DD, moderately reduced RVSF, mod elevated PASP, borderline dilation of ascending aorta, 35mm, IVC dilated, RA ~15.  - Cardiology following, appreciate assistance - Possible LHC when mental status improves/patient can cooperate with and consent for procedure - Continue Lopressor, Lisinopril, Clonidine, Hydralazine PRN - Continue ASA/statin  At risk for malnutrition - RD/Nutrition following, appreciate recs - TF via Cortak (placed 3/27) - PT/OT/SLP as indicated; will need formal swallow study before diet can be advanced  AKI, likely multifactorial Hypernatremia - Trend BMP, specifically Na - FWF for hyperNa - Replete electrolytes as indicated - Monitor I&Os - Avoid nephrotoxic agents as able - Ensure adequate renal perfusion  Best Practice (right click and "Reselect all SmartList Selections" daily)   Diet/type: NPO; TF  DVT prophylaxis: LMWH GI prophylaxis: N/A Lines: N/A Foley:  N/A Code Status:  DNR DNI, okay to continue bipap if needed Last date of multidisciplinary goals of care discussion [3/24]  Jeanie Cooks, daughter, [place], significant other>  [phone]  Critical care time: N/A   Rhae Lerner [facility] 07/01/22 3:13 PM  Please see [email or web address] for pager details.  From 7A-7P if no response, please call [phone] After hours, please call ELink  [phone]

## 2022-07-02 ENCOUNTER — Inpatient Hospital Stay (HOSPITAL_COMMUNITY): Payer: No Typology Code available for payment source

## 2022-07-02 ENCOUNTER — Encounter (HOSPITAL_COMMUNITY): Payer: PRIVATE HEALTH INSURANCE

## 2022-07-02 DIAGNOSIS — L538 Other specified erythematous conditions: Secondary | ICD-10-CM | POA: Diagnosis not present

## 2022-07-02 DIAGNOSIS — I214 Non-ST elevation (NSTEMI) myocardial infarction: Secondary | ICD-10-CM | POA: Diagnosis not present

## 2022-07-02 DIAGNOSIS — I200 Unstable angina: Secondary | ICD-10-CM | POA: Diagnosis not present

## 2022-07-02 DIAGNOSIS — I10 Essential (primary) hypertension: Secondary | ICD-10-CM | POA: Diagnosis not present

## 2022-07-02 DIAGNOSIS — R52 Pain, unspecified: Secondary | ICD-10-CM | POA: Diagnosis not present

## 2022-07-02 DIAGNOSIS — M7989 Other specified soft tissue disorders: Secondary | ICD-10-CM | POA: Diagnosis not present

## 2022-07-02 DIAGNOSIS — G9341 Metabolic encephalopathy: Secondary | ICD-10-CM | POA: Diagnosis not present

## 2022-07-02 LAB — BASIC METABOLIC PANEL
Anion gap: 8 (ref 5–15)
BUN: 51 mg/dL — ABNORMAL HIGH (ref 8–23)
CO2: 31 mmol/L (ref 22–32)
Calcium: 8.3 mg/dL — ABNORMAL LOW (ref 8.9–10.3)
Chloride: 107 mmol/L (ref 98–111)
Creatinine, Ser: 1.3 mg/dL — ABNORMAL HIGH (ref 0.61–1.24)
GFR, Estimated: 57 mL/min — ABNORMAL LOW (ref >60)
Glucose, Bld: 197 mg/dL — ABNORMAL HIGH (ref 70–99)
Potassium: 3.6 mmol/L (ref 3.5–5.1)
Sodium: 146 mmol/L — ABNORMAL HIGH (ref 135–145)

## 2022-07-02 LAB — CBC
HCT: 40 % (ref 39.0–52.0)
Hemoglobin: 12.7 g/dL — ABNORMAL LOW (ref 13.0–17.0)
MCH: 34.5 pg — ABNORMAL HIGH (ref 26.0–34.0)
MCHC: 31.8 g/dL (ref 30.0–36.0)
MCV: 108.7 fL — ABNORMAL HIGH (ref 80.0–100.0)
Platelets: 285 10*3/uL (ref 150–400)
RBC: 3.68 MIL/uL — ABNORMAL LOW (ref 4.22–5.81)
RDW: 15.1 % (ref 11.5–15.5)
WBC: 16.3 10*3/uL — ABNORMAL HIGH (ref 4.0–10.5)
nRBC: 0 % (ref 0.0–0.2)

## 2022-07-02 LAB — PHOSPHORUS: Phosphorus: 3.1 mg/dL (ref 2.5–4.6)

## 2022-07-02 LAB — GLUCOSE, CAPILLARY
Glucose-Capillary: 147 mg/dL — ABNORMAL HIGH (ref 70–99)
Glucose-Capillary: 147 mg/dL — ABNORMAL HIGH (ref 70–99)
Glucose-Capillary: 159 mg/dL — ABNORMAL HIGH (ref 70–99)
Glucose-Capillary: 161 mg/dL — ABNORMAL HIGH (ref 70–99)
Glucose-Capillary: 162 mg/dL — ABNORMAL HIGH (ref 70–99)
Glucose-Capillary: 170 mg/dL — ABNORMAL HIGH (ref 70–99)
Glucose-Capillary: 214 mg/dL — ABNORMAL HIGH (ref 70–99)

## 2022-07-02 LAB — MAGNESIUM: Magnesium: 2.9 mg/dL — ABNORMAL HIGH (ref 1.7–2.4)

## 2022-07-02 MED ORDER — POTASSIUM CHLORIDE 20 MEQ PO PACK
40.0000 meq | PACK | Freq: Once | ORAL | Status: AC
  Administered 2022-07-02: 40 meq
  Filled 2022-07-02: qty 2

## 2022-07-02 MED ORDER — GUAIFENESIN ER 600 MG PO TB12
1200.0000 mg | ORAL_TABLET | Freq: Two times a day (BID) | ORAL | Status: DC
  Administered 2022-07-02 – 2022-07-13 (×21): 1200 mg via ORAL
  Filled 2022-07-02 (×23): qty 2

## 2022-07-02 MED ORDER — JUVEN PO PACK
1.0000 | PACK | Freq: Two times a day (BID) | ORAL | Status: DC
  Administered 2022-07-03 – 2022-07-05 (×6): 1
  Filled 2022-07-02 (×7): qty 1

## 2022-07-02 MED ORDER — FREE WATER
300.0000 mL | Status: DC
  Administered 2022-07-02 – 2022-07-06 (×41): 300 mL

## 2022-07-02 MED ORDER — INSULIN GLARGINE-YFGN 100 UNIT/ML ~~LOC~~ SOLN
8.0000 [IU] | Freq: Every day | SUBCUTANEOUS | Status: DC
  Administered 2022-07-02 – 2022-07-14 (×13): 8 [IU] via SUBCUTANEOUS
  Filled 2022-07-02 (×13): qty 0.08

## 2022-07-02 NOTE — Progress Notes (Addendum)
NAME:  [name] Oneal, MRN:  [id number], DOB:  1945-10-07, LOS: 12 ADMISSION DATE:  06/20/2022, CONSULTATION DATE:  06/21/2022 REFERRING MD:  Frann Rider, MD CHIEF COMPLAINT:  Alcohol Withdrawal   History of Present Illness:  [name] Oneal is a 77 year old male, daily smoker with CAD who was admitted 3/23 for chest pain noted to have an NSTEMI. 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 He has received 12mg  of IV ativan since night shift and remains very fidgety and restless. CIWA scores are 18-21.   PCCM consulted for concern of progressive alcohol withdrawal.   Worsening oxygenation overnight, now on 80%  Pertinent Medical History:   Past Medical History:  Diagnosis Date   Alcoholic ([place])    Hypertension   CAD s/p DES in 2003 Tobacco Use  Significant Hospital Events: Including procedures, antibiotic start and stop dates in addition to other pertinent events   3/23 admitted for NSTEMI 3/24 PCCM consulted for alcohol withdrawal 3/27 Agitated overnight requiring restraints. Remains on Precedex. Phenobarb ongoing. Cortrak today. 4/1 remains encephalopathic, though calmer and off precedex 4/2 Mental status initially much improved. Became unresponsive ~1550 prompting Code Stroke. LKW 1540. STAT CT Head/CTA Head/Neck obtained, NAICA/no LVO. Suspect metabolic/non-neurologic deficit. Code Stroke cancelled. 4/3 EEG obtained overnight demonstrating mild diffuse encephalopathy, no seizures. Intermittently agitated/combative with staff. Mitts in place. Failed bedside swallow with PA. 4/4 Pleasant and conversant, no longer combative. Mild intermittent agitation. Daughter at bedside. New LLE swelling/redness/pain, r/o DVT (dopplers negative)  Interim History / Subjective:  No significant events overnight Mental status continues to improve More conversant/pleasant today, no longer combative Daughter at bedside, states he  is much more himself New redness/mild edema/pain of LLE, +skin tear; continues on Zyvox (Zosyn to end today) Mepilex placed, US Dopplers negative for DVT Remains off of Precedex Reassess in PM for transfer out of ICU  Objective:  Blood pressure (!) 135/54, pulse 78, temperature 98.8 F (37.1 C), temperature source Axillary, resp. rate (!) 21, height 5\' 10"  (1.778 m), weight 106.5 kg, SpO2 91 %.        Intake/Output Summary (Last 24 hours) at 07/02/2022 0803 Last data filed at 07/02/2022 0600 Gross per 24 hour  Intake 2138.5 ml  Output 2475 ml  Net -336.5 ml    Filed Weights   06/30/22 0500 07/01/22 0500 07/02/22 0359  Weight: 104.8 kg 107.2 kg 106.5 kg   Physical Examination: General: Chronically ill-appearing older man in NAD. More conversant today. HEENT: [place]/AT, anicteric sclera, PERRL, dry mucous membranes. Cortrak in place. Neuro: Awake, oriented x 3. Responds to verbal stimuli. Following commands consistently. Moves all 4 extremities spontaneously. Strength 4-/5 in all 4 extremities, marginally weaker on R.  CV: RRR, no m/g/r. PULM: Breathing even and unlabored on 10L [place]. Lung fields diminished at bilateral bases. GI: Rotund, nontender, mildly distended. Normoactive bowel sounds. Extremities: Bilateral 1+ LE edema, L slightly > R, mild erythema of LLE + pain with palpation. Skin: Warm/dry, mild erythema of LLE.  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 EEG negative for seizures. Neuro consulted, signed off 4/3. 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 - Trend WBC, fevers resolved - F/u finalized Cx data - Continue Zyvox, Zosyn to end today 4/4 - Intermittent CXR  NSTEMI/angina, CAD, HTN TTE 3/27> EF 50-55%, mod LVH, G2DD, moderately reduced RVSF, mod elevated PASP, borderline dilation of ascending aorta, 41mm, IVC dilated, RA ~15.  - Cardiology signed off 4/4, will re-engage once recovered if LHC still warranted - Patient must be able to consent for/cooperate for procedure - Continue Lopressor, Lisinopril, Clonidine, Hydral PRN - ASA/statin  At risk for malnutrition - RD/Nutrition following, appreciate recs - TF via Cortrak (placed 3/27) - PT/OT/SLP as indicated, will need formal swallow study before diet can be advanced  AKI, likely multifactorial Hypernatremia - Trend BMP, specifically Na - Replete electrolytes as indicated - Monitor I&Os - Avoid nephrotoxic agents as able - Ensure adequate renal perfusion  Will reassess in PM for transfer out of unit. Making good progress.  Best Practice (right click and "Reselect all SmartList Selections" daily)   Diet/type: NPO; TF  DVT prophylaxis: LMWH GI prophylaxis: N/A Lines: N/A Foley:  N/A Code Status:  DNR DNI, okay to continue BiPAP if needed Last date of multidisciplinary goals of care discussion [3/24]  Jeanie Cooks, daughter, [place], significant other>  [phone]  Critical care time: N/A   Rhae Lerner [facility] 07/02/22 8:03 AM  Please see [email or web address] for pager details.  From 7A-7P if no response, please call [phone] After hours, please call ELink  [phone]

## 2022-07-02 NOTE — Progress Notes (Signed)
Nutrition Follow-up  DOCUMENTATION CODES:   Non-severe (moderate) malnutrition in context of acute illness/injury (possible component of social/environmental)  INTERVENTION:   Tube feeding via Cortrak:  Continue Vital AF 1.2 at  goal of 70 ml/hr TF provides 2016 kcals, 126 g of protein and 1361 mL of free water  Current free water flushes of 200 mL q 2 hours with current TF provide 3.8 L free water  Add Juven BID, each packet provides 80 calories, 8 grams of carbohydrate, 2.5  grams of protein (collagen), 7 grams of L-arginine and 7 grams of L-glutamine; supplement contains CaHMB, Vitamins C, E, B12 and Zinc to promote wound healing  Continue Thiamine, Folic Acid, 123456, C, Zinc   NUTRITION DIAGNOSIS:   Moderate Malnutrition related to acute illness as evidenced by meal completion < 50%, energy intake < 75% for > 7 days, mild muscle depletion, moderate muscle depletion.  Progressing  GOAL:   Patient will meet greater than or equal to 90% of their needs  Met via TF   MONITOR:   TF tolerance, Labs, Weight trends, Skin  REASON FOR ASSESSMENT:   Consult Enteral/tube feeding initiation and management  ASSESSMENT:   77 y.o. male admits related to chest pain. PMH includes: alcoholic, HTN. Pt is currently receiving medical management related to unstable angina.  3/23 Admitted 3/27 Cortrak placed, TF initiated  Mental status remains altered. Pt remains NPO, HFNC 10 L 93%  Tolerating Vital AF 1.2 at 70 ml/hr via Cortrak. Currently receiving 200 mL free water q 2 hours.   Noted lactulose continues, 200 mL stool documented with 3 unmeasured occurrences in 24 hours  UOP 2275 mL of UOP within 24 hours  Current wt 106.5 kg, weight has fluctuated up and down a little.  Labs: sodium 146 (H), Creatinine 1.30, BUN 50, CBGs 147-214 Meds: ss novolog, lactulose, semglee, MVI with Minerals, folic acid, 123456, MVI, Vit C, Zinc Sulfate  Micronutrient Labs 06/20/22:  CRP not  available Folic Acid: 3.1 (LOW) Vitamin B12: 130  (LOW - usually elevated in acute illness, suspect significant deficiency present)    Diet Order:   Diet Order             Diet NPO time specified  Diet effective now                   EDUCATION NEEDS:   Not appropriate for education at this time  Skin:  Skin Assessment: Skin Integrity Issues: Skin Integrity Issues:: Unstageable, DTI DTI: bilateral heels, right posterior thigh Unstageable: bridge of nose from Bipap  Last BM:  4/4 rectal tube  Height:   Ht Readings from Last 1 Encounters:  06/20/22 5\' 10"  (1.778 m)    Weight:   Wt Readings from Last 1 Encounters:  07/02/22 106.5 kg    BMI:  Body mass index is 33.69 kg/m.  Estimated Nutritional Needs:   Kcal:  2000-2200 kcals  Protein:  115-135 g  Fluid:  >/= 2L   Kerman Passey MS, RDN, LDN, CNSC Registered Dietitian 3 Clinical Nutrition RD Pager and On-Call Pager Number Located in [place]

## 2022-07-02 NOTE — Plan of Care (Signed)
Patient continues to be altered.  Not a candidate for invasive angiography.  Discussed his case with critical care medicine.  They will reach out if he improves enough for reevaluation.  Cardiology to sign off at this time.  [place] T. Audie Box, MD, [place]  [address] [place], [place] [address] [phone]  11:28 AM

## 2022-07-02 NOTE — Progress Notes (Signed)
Lower extremity venous left study completed.  Preliminary results relayed to [facility], DO.   See CV Proc for preliminary results report.   Darlin Coco, RDMS, RVT

## 2022-07-02 NOTE — TOC Progression Note (Signed)
Transition of Care [facility]) - Progression Note    Patient Details  Name: [name] Oneal MRN: [id number] Date of Birth: [birth year]  [facility] CM/SW Contact  Graves-Bigelow, Ocie Cornfield, RN Phone Number: 07/02/2022, 4:20 PM  Clinical Narrative:  Patient was discussed in progression rounds this morning. Patient has a hx of ETOH abuse- now hospital course complicated by alcohol withdrawal and delirium. Case Manager attempted to call significant other regarding disposition needs; received voicemail. Patient will eventually need PT/OT evaluation as he progresses.   Expected Discharge Plan:  (TBD) Barriers to Discharge: Continued Medical Work up  Expected Discharge Plan and Services   Discharge Planning Services: CM Consult   Living arrangements for the past 2 months: Single Family Home  Social Determinants of Health (SDOH) Interventions SDOH Screenings   Food Insecurity: No Food Insecurity (06/20/2022)  Housing: Low Risk  (06/20/2022)  Transportation Needs: No Transportation Needs (06/20/2022)  Utilities: Not At Risk (06/20/2022)  Tobacco Use: High Risk (06/20/2022)    Readmission Risk Interventions     No data to display

## 2022-07-02 NOTE — Progress Notes (Signed)
Pt tolerating HFNC at this time of 10L and maintaining SpO2 levels of 97%. Will use BiPAP as needed, RN and family made aware.

## 2022-07-03 DIAGNOSIS — E7220 Disorder of urea cycle metabolism, unspecified: Secondary | ICD-10-CM

## 2022-07-03 DIAGNOSIS — I200 Unstable angina: Secondary | ICD-10-CM | POA: Diagnosis not present

## 2022-07-03 DIAGNOSIS — I214 Non-ST elevation (NSTEMI) myocardial infarction: Secondary | ICD-10-CM | POA: Diagnosis not present

## 2022-07-03 DIAGNOSIS — R451 Restlessness and agitation: Secondary | ICD-10-CM | POA: Diagnosis not present

## 2022-07-03 DIAGNOSIS — J9601 Acute respiratory failure with hypoxia: Secondary | ICD-10-CM | POA: Diagnosis not present

## 2022-07-03 LAB — GLUCOSE, CAPILLARY
Glucose-Capillary: 124 mg/dL — ABNORMAL HIGH (ref 70–99)
Glucose-Capillary: 127 mg/dL — ABNORMAL HIGH (ref 70–99)
Glucose-Capillary: 153 mg/dL — ABNORMAL HIGH (ref 70–99)
Glucose-Capillary: 154 mg/dL — ABNORMAL HIGH (ref 70–99)
Glucose-Capillary: 158 mg/dL — ABNORMAL HIGH (ref 70–99)
Glucose-Capillary: 185 mg/dL — ABNORMAL HIGH (ref 70–99)

## 2022-07-03 MED ORDER — QUETIAPINE FUMARATE 25 MG PO TABS
25.0000 mg | ORAL_TABLET | Freq: Two times a day (BID) | ORAL | Status: DC
  Administered 2022-07-03: 25 mg
  Filled 2022-07-03: qty 1

## 2022-07-03 MED ORDER — QUETIAPINE FUMARATE 25 MG PO TABS
12.5000 mg | ORAL_TABLET | Freq: Two times a day (BID) | ORAL | Status: DC
  Administered 2022-07-03 – 2022-07-04 (×2): 12.5 mg
  Filled 2022-07-03 (×2): qty 1

## 2022-07-03 MED ORDER — VALPROIC ACID 250 MG/5ML PO SOLN
250.0000 mg | Freq: Two times a day (BID) | ORAL | Status: DC
  Administered 2022-07-03 – 2022-07-05 (×5): 250 mg
  Filled 2022-07-03 (×5): qty 5

## 2022-07-03 MED ORDER — VALPROIC ACID 250 MG/5ML PO SOLN: 500.0000 mg | Freq: Two times a day (BID) | ORAL | Status: DC

## 2022-07-03 MED ORDER — HALOPERIDOL LACTATE 5 MG/ML IJ SOLN
2.0000 mg | Freq: Four times a day (QID) | INTRAMUSCULAR | Status: DC | PRN
  Administered 2022-07-04 – 2022-07-07 (×3): 2 mg via INTRAVENOUS
  Filled 2022-07-03 (×3): qty 1

## 2022-07-03 NOTE — Progress Notes (Signed)
Physical Therapy Evaluation Patient Details Name: [name] Oneal Bias MRN: [id number] DOB: February 28, 1946 Today's Date: 07/03/2022  History of Present Illness  77 y/o gentleman presented with CP and was found to have an NSTEMI.  Hospital course has been complicated by severe alcohol withdrawal and delirium tremens. PMH: CAD, tobacco and ETOH abuse  Clinical Impression  PTA pt reports living with spouse, but pt poor historian and unable to provide credible information. Pt with multiple pressure injuries and reports he has not been getting out of bed. Pt is currently maxAx2 for bed mobility, and transfer attempt, but unable to clear hips from bed surface. Patient will benefit from continued inpatient follow up therapy, <3 hours/day. PT will continue to follow acutely.         Recommendations for follow up therapy are one component of a multi-disciplinary discharge planning process, led by the attending physician.  Recommendations may be updated based on patient status, additional functional criteria and insurance authorization.  Follow Up Recommendations Can patient physically be transported by private vehicle: No     Assistance Recommended at Discharge Frequent or constant Supervision/Assistance  Patient can return home with the following  Two people to help with walking and/or transfers;Two people to help with bathing/dressing/bathroom;Assistance with cooking/housework;Assistance with feeding;Direct supervision/assist for medications management;Direct supervision/assist for financial management;Assist for transportation;Help with stairs or ramp for entrance    Equipment Recommendations  (TBD)     Functional Status Assessment Patient has had a recent decline in their functional status and demonstrates the ability to make significant improvements in function in a reasonable and predictable amount of time.     Precautions / Restrictions Precautions Precautions: Fall Precaution Comments: ETOH  withdrawal; can be combative Restrictions Weight Bearing Restrictions: No      Mobility  Bed Mobility Overal bed mobility: Needs Assistance Bed Mobility: Supine to Sit, Sit to Supine     Supine to sit: Max assist, +2 for physical assistance Sit to supine: Max assist, +2 for physical assistance        Transfers Overall transfer level: Needs assistance   Transfers: Sit to/from Stand Sit to Stand: Max assist, +2 physical assistance           General transfer comment: 3 attempts to stand. however unable to clear buttocks from bed; able to assists minimally with scooting        Balance Overall balance assessment: History of Falls, Needs assistance Sitting-balance support:  (requires outside assist to maintain balance) Sitting balance-Leahy Scale: Poor                                       Pertinent Vitals/Pain Pain Assessment Pain Assessment: Faces Faces Pain Scale: Hurts whole lot Pain Location: BLE/feet Pain Descriptors / Indicators: Discomfort, Grimacing, Guarding, Moaning Pain Intervention(s): Limited activity within patient's tolerance, Monitored during session, Repositioned    Home Living Family/patient expects to be discharged to:: Skilled nursing facility Living Arrangements: Children                      Prior Function Prior Level of Function : Patient poor historian/Family not available (attempted to call significant other for PLOF)                     Hand Dominance   Dominant Hand: Right    Extremity/Trunk Assessment   Upper Extremity Assessment Upper Extremity Assessment: Generalized weakness (  attempting to use BUE however only has gross grasp/release; edematous arms; able to reach his mouth/eyes with a cloth; difficulty sustaining grip on cloth due to weakness and poor attention)    Lower Extremity Assessment Lower Extremity Assessment: Difficult to assess due to impaired cognition (pain in wounds on bilateral  heels, and L posterior calf limiting his AROM,)    Cervical / Trunk Assessment Cervical / Trunk Assessment: Normal  Communication   Communication: Expressive difficulties (slurred speech)  Cognition Arousal/Alertness: Awake/alert Behavior During Therapy: Restless Overall Cognitive Status: Impaired/Different from baseline Area of Impairment: Orientation, Attention, Memory, Following commands, Safety/judgement, Awareness, Problem solving                 Orientation Level: Disoriented to, Place, Time, Situation Current Attention Level: Sustained Memory: Decreased short-term memory Following Commands: Follows one step commands with increased time Safety/Judgement: Decreased awareness of safety, Decreased awareness of deficits Awareness: Intellectual Problem Solving: Slow processing, Decreased initiation, Difficulty sequencing, Requires verbal cues, Requires tactile cues General Comments: on CIWA        General Comments General comments (skin integrity, edema, etc.): multiple pressure injuries to LE and sacrum, pt requiring 2-3L O2 via [place] during session    Exercises     Assessment/Plan    PT Assessment Patient needs continued PT services  PT Problem List Decreased strength;Decreased range of motion;Decreased activity tolerance;Decreased balance;Decreased mobility;Decreased coordination;Decreased cognition;Decreased knowledge of use of DME;Decreased safety awareness;Decreased knowledge of precautions;Cardiopulmonary status limiting activity;Impaired sensation;Decreased skin integrity;Pain       PT Treatment Interventions DME instruction;Gait training;Functional mobility training;Therapeutic activities;Therapeutic exercise;Balance training;Cognitive remediation;Patient/family education;Wheelchair mobility training    PT Goals (Current goals can be found in the Care Plan section)  Acute Rehab PT Goals Patient Stated Goal: go home PT Goal Formulation: Patient unable to  participate in goal setting Time For Goal Achievement: 07/17/22 Potential to Achieve Goals: Poor    Frequency Min 1X/week     Co-evaluation PT/OT/SLP Co-Evaluation/Treatment: Yes Reason for Co-Treatment: Complexity of the patient's impairments (multi-system involvement);For patient/therapist safety;To address functional/ADL transfers PT goals addressed during session: Mobility/safety with mobility OT goals addressed during session: ADL's and self-care       AM-PAC PT "6 Clicks" Mobility  Outcome Measure Help needed turning from your back to your side while in a flat bed without using bedrails?: Total Help needed moving from lying on your back to sitting on the side of a flat bed without using bedrails?: Total Help needed moving to and from a bed to a chair (including a wheelchair)?: Total Help needed standing up from a chair using your arms (e.g., wheelchair or bedside chair)?: Total Help needed to walk in hospital room?: Total Help needed climbing 3-5 steps with a railing? : Total 6 Click Score: 6    End of Session Equipment Utilized During Treatment: Gait belt;Oxygen Activity Tolerance: Patient limited by pain;Patient limited by lethargy Patient left: in bed;with call bell/phone within reach;with bed alarm set;Other (comment) (bed in chair position) Nurse Communication: Mobility status PT Visit Diagnosis: Unsteadiness on feet (R26.81);Other abnormalities of gait and mobility (R26.89);Repeated falls (R29.6);Muscle weakness (generalized) (M62.81);History of falling (Z91.81);Difficulty in walking, not elsewhere classified (R26.2);Other symptoms and signs involving the nervous system (R29.898);Adult, failure to thrive (R62.7);Pain Pain - part of body:  (multiple pressure injuries)    Time: 6060-0459 PT Time Calculation (min) (ACUTE ONLY): 36 min   Charges:   PT Evaluation $PT Eval Moderate Complexity: 1 Mod          Jahrel Borthwick B.  Beverely RisenVan Fleet PT, DPT Acute Rehabilitation  Services Please use secure chat or  Call Office [phone]   Elon Alaslizabeth B Van [facility] 07/03/2022, 11:59 AM

## 2022-07-03 NOTE — Progress Notes (Addendum)
NAME:  Adolpho Ursin, MRN:  [id number], DOB:  Aug 19, 1945, LOS: 13 ADMISSION DATE:  06/20/2022, CONSULTATION DATE:  06/21/2022 REFERRING MD:  Nash Mantis, MD CHIEF COMPLAINT:  Alcohol Withdrawal   History of Present Illness:  [name] Oneal is a 77 year old male, daily smoker with CAD who was admitted 3/23 for chest pain noted to have an NSTEMI. 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 He has received 12mg  of IV ativan since night shift and remains very fidgety and restless. CIWA scores are 18-21.   PCCM consulted for concern of progressive alcohol withdrawal.   Worsening oxygenation overnight, now on 80%  Pertinent Medical History:   Past Medical History:  Diagnosis Date   Alcoholic (HCC)    Hypertension   CAD s/p DES in 2003 Tobacco Use  Significant Hospital Events: Including procedures, antibiotic start and stop dates in addition to other pertinent events   3/23 admitted for NSTEMI 3/24 PCCM consulted for alcohol withdrawal 3/27 Agitated overnight requiring restraints. Remains on Precedex. Phenobarb ongoing. Cortrak today. 4/1 remains encephalopathic, though calmer and off precedex 4/2 Mental status initially much improved. Became unresponsive ~1550 prompting Code Stroke. LKW 1540. STAT CT Head/CTA Head/Neck obtained, NAICA/no LVO. Suspect metabolic/non-neurologic deficit. Code Stroke cancelled. 4/3 EEG obtained overnight demonstrating mild diffuse encephalopathy, no seizures. Intermittently agitated/combative with staff. Mitts in place. Failed bedside swallow with PA. 4/4 Pleasant and conversant, no longer combative. Mild intermittent agitation. Daughter at bedside. New LLE swelling/redness/pain, r/o DVT (dopplers negative) 4/5 More agitated and intermittently combative today, +delirious/hallucinating. Uncooperative with injectable medication administration, refusing insulin/Lovenox. Remains on 4L  [place].  Interim History / Subjective:  No significant events overnight More agitated and intermittently combative today Demanding water and to walk but "only if I can go across the street" Delirious with some hallucinations (little girl in the room, cats, thinking the E-link camera was a large cup for water) Refusing injections (insulin, Lovenox) Seroquel and Haldol PRN added, QTc 465 on EKG Remains on 4L [place] PT/OT requested, unsure patient will cooperate enough to participate  Objective:  Blood pressure (!) 136/102, pulse 78, temperature 98 F (36.7 C), temperature source Oral, resp. rate (!) 21, height 5\' 10"  (1.778 m), weight 106.1 kg, SpO2 92 %.        Intake/Output Summary (Last 24 hours) at 07/03/2022 0729 Last data filed at 07/03/2022 0700 Gross per 24 hour  Intake 2719.3 ml  Output 1735 ml  Net 984.3 ml    Filed Weights   07/01/22 0500 07/02/22 0359 07/03/22 0257  Weight: 107.2 kg 106.5 kg 106.1 kg   Physical Examination: General: Chronically ill-appearing elderly man in NAD. HEENT: [place]/AT, anicteric sclera, PERRL, moist mucous membranes. Abrasion to bridge of nose with scab/eschar, poor dentition. Neuro: Awake, oriented x 2-3. Suspect some degree of delirium, intermittently hallucinating.Responds to verbal stimuli. Following commands consistently. Moves all 4 extremities spontaneously. Strength 5/5 in all 4 extremities. CV: RRR, no m/g/r. PULM: Breathing even and unlabored on 4L [place]. Lung fields diminished at bilateral bases. GI: Rotund, soft, mildly distended, normoactive bowel sounds. Extremities: Bilateral symmetric 1+ LE edema noted. LLE erythema improved from prior exam. Skin: Warm/dry, no significant rashes, mild erythema of LLE.  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 EEG negative for seizures. Neuro consulted, signed off 4/3. 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 - Trend WBC Q48H, remains afebrile - F/u finalized Cx data - Continue Zyvox - Intermittent CXR  NSTEMI/angina, CAD, HTN TTE 3/27> EF 50-55%, mod LVH, G2DD, moderately reduced RVSF, mod elevated PASP, borderline dilation of ascending aorta, 35mm, IVC dilated, RA ~15.  - Cardiology signed off 4/4, will re-engage once recovered if LHC still warranted - Patient must be able to consent/cooperate for procedure - Continue Lopressor, Lisinopril, Clonidine, Hydral PRN - ASA/statin  At risk for malnutrition - RD/Nutrition following, appreciate recs - TF va Cortrak - PT/OT when patient able to participate in care - SLP, will need formal swallow study before diet can be advanced  AKI, likely multifactorial Hypernatremia - Trend BMP - Replete electrolytes as indicated - Monitor I&Os - Avoid nephrotoxic agents as able - Ensure adequate renal perfusion  Best Practice (right click and "Reselect all SmartList Selections" daily)   Diet/type: NPO; TF  DVT prophylaxis: LMWH GI prophylaxis: N/A Lines: N/A Foley:  N/A Code Status:  DNR DNI, okay to continue BiPAP if needed Last date of multidisciplinary goals of  care discussion [3/24]  Carola Rhineammi Lyod, daughter, [phone] Melody Ward, significant other>  [phone]  Critical care time: N/A   Faythe GheeStephanie M Merial Moritz, PA-C [facility] 07/03/22 7:29 AM  Please see [email or web address] for pager details.  From 7A-7P if no response, please call [phone] After hours, please call ELink [phone]

## 2022-07-03 NOTE — Evaluation (Signed)
Occupational Therapy Evaluation Patient Details Name: Autumn PattyCharles Thomas Josephson MRN: [id number] DOB: [birth year] Today's Date: 07/03/2022   History of Present Illness 77 y/o gentleman presented with CP and was found to have an NSTEMI.  Hospital course has been complicated by severe alcohol withdrawal and delirium tremens. PMH: CAD, tobacco and ETOH abuse   Clinical Impression   Attempted to call significant other for PLOF however no answer. Per chart review,  pt from home.  Able to progress to edge of bed and participated in 3 attempts to stand with Max A +2. Overall Max to total A with all ADL tasks. . Patient will benefit from continued inpatient follow up therapy, <3 hours/day.  VSS;SpO2 decrease to 86 on 2L during activity however improved into the low 90s on 3L.       Recommendations for follow up therapy are one component of a multi-disciplinary discharge planning process, led by the attending physician.  Recommendations may be updated based on patient status, additional functional criteria and insurance authorization.   Assistance Recommended at Discharge Frequent or constant Supervision/Assistance  Patient can return home with the following  (NA for DC home)    Functional Status Assessment  Patient has had a recent decline in their functional status and demonstrates the ability to make significant improvements in function in a reasonable and predictable amount of time.  Equipment Recommendations  None recommended by OT    Recommendations for Other Services       Precautions / Restrictions Precautions Precautions: Fall Precaution Comments: ETOH withdrawal; can be combative Restrictions Weight Bearing Restrictions: No      Mobility Bed Mobility Overal bed mobility: Needs Assistance Bed Mobility: Supine to Sit, Sit to Supine     Supine to sit: Max assist, +2 for physical assistance Sit to supine: Max assist, +2 for physical assistance        Transfers Overall transfer  level: Needs assistance   Transfers: Sit to/from Stand Sit to Stand: Max assist, +2 physical assistance           General transfer comment: 3 attempts to stand. however unable to clear buttocks from bed; able to assists minimally with scooting      Balance Overall balance assessment: History of Falls, Needs assistance   Sitting balance-Leahy Scale: Poor                                     ADL either performed or assessed with clinical judgement   ADL Overall ADL's : Needs assistance/impaired Eating/Feeding: NPO   Grooming: Maximal assistance;Bed level   Upper Body Bathing: Maximal assistance;Bed level   Lower Body Bathing: Total assistance   Upper Body Dressing : Total assistance   Lower Body Dressing: Total assistance               Functional mobility during ADLs: Maximal assistance;+2 for physical assistance (unable to stand)       Vision   Vision Assessment?:  (will further assess; able to sustain visual attention to therapist)     Perception     Praxis      Pertinent Vitals/Pain Pain Assessment Pain Assessment: Faces Faces Pain Scale: Hurts whole lot Pain Location: BLE/feet Pain Descriptors / Indicators: Discomfort, Grimacing, Guarding, Moaning Pain Intervention(s): Limited activity within patient's tolerance     Hand Dominance Right   Extremity/Trunk Assessment Upper Extremity Assessment Upper Extremity Assessment: Generalized weakness (attempting to use BUE however  only has gross grasp/release; edematous arms; able to reach his mouth/eyes with a cloth; difficulty sustaining grip on cloth due to weakness and poor attention)   Lower Extremity Assessment Lower Extremity Assessment: Defer to PT evaluation   Cervical / Trunk Assessment Cervical / Trunk Assessment: Normal   Communication Communication Communication: Expressive difficulties (slurred speech)   Cognition Arousal/Alertness: Awake/alert Behavior During Therapy:  Restless Overall Cognitive Status: Impaired/Different from baseline Area of Impairment: Orientation, Attention, Memory, Following commands, Safety/judgement, Awareness, Problem solving                 Orientation Level: Disoriented to, Place, Time, Situation Current Attention Level: Sustained Memory: Decreased short-term memory Following Commands: Follows one step commands with increased time Safety/Judgement: Decreased awareness of safety, Decreased awareness of deficits Awareness: Intellectual Problem Solving: Slow processing, Decreased initiation, Difficulty sequencing, Requires verbal cues, Requires tactile cues General Comments: on CIWA     General Comments  multiple wounds/pressure injuries    Exercises     Shoulder Instructions      Home Living Family/patient expects to be discharged to:: Skilled nursing facility                                        Prior Functioning/Environment Prior Level of Function : Patient poor historian/Family not available (attempted to call significant other for PLOF)                        OT Problem List: Decreased strength;Decreased range of motion;Decreased activity tolerance;Impaired balance (sitting and/or standing);Decreased coordination;Decreased cognition;Decreased safety awareness;Decreased knowledge of use of DME or AE;Cardiopulmonary status limiting activity;Obesity;Impaired UE functional use;Pain;Increased edema      OT Treatment/Interventions: Self-care/ADL training;Therapeutic exercise;Energy conservation;DME and/or AE instruction;Therapeutic activities;Cognitive remediation/compensation;Patient/family education;Balance training    OT Goals(Current goals can be found in the care plan section) Acute Rehab OT Goals Patient Stated Goal: to stay in the bed OT Goal Formulation: Patient unable to participate in goal setting Time For Goal Achievement: 07/17/22 Potential to Achieve Goals: Good  OT  Frequency: Min 2X/week    Co-evaluation PT/OT/SLP Co-Evaluation/Treatment: Yes Reason for Co-Treatment: Complexity of the patient's impairments (multi-system involvement);For patient/therapist safety;To address functional/ADL transfers   OT goals addressed during session: ADL's and self-care      AM-PAC OT "6 Clicks" Daily Activity     Outcome Measure Help from another person eating meals?: Total Help from another person taking care of personal grooming?: A Lot Help from another person toileting, which includes using toliet, bedpan, or urinal?: A Lot Help from another person bathing (including washing, rinsing, drying)?: Total Help from another person to put on and taking off regular upper body clothing?: Total Help from another person to put on and taking off regular lower body clothing?: Total 6 Click Score: 8   End of Session Equipment Utilized During Treatment: Gait belt;Oxygen Nurse Communication: Mobility status  Activity Tolerance: Patient tolerated treatment well Patient left: in bed;with call bell/phone within reach;with bed alarm set (prevelons repositioned; chair position)  OT Visit Diagnosis: Unsteadiness on feet (R26.81);Other abnormalities of gait and mobility (R26.89);Repeated falls (R29.6);Muscle weakness (generalized) (M62.81);Other symptoms and signs involving cognitive function;Pain Pain - part of body: Leg;Ankle and joints of foot (B)                Time: 9688-6484 OT Time Calculation (min): 35 min Charges:  OT General Charges $OT  Visit: 1 Visit OT Evaluation $OT Eval Moderate Complexity: 1 Mod  Myca Perno, OT/L   Acute OT Clinical Specialist Acute Rehabilitation Services Pager [phone] Office [phone]   [facility] CenterWARD,HILLARY 07/03/2022, 11:21 AM

## 2022-07-03 NOTE — Progress Notes (Signed)
eLink Physician-Brief Progress Note Patient Name: [name] Oneal DOB: Oct 15, 1945 MRN: [id number]   Date of Service  07/03/2022  HPI/Events of Note  Frequent loose stools d/t lactulose - Nursing reports that rectal pouch will not stay on and requests a Flexiseal order.   eICU Interventions  Plan: Place Flexiseal.      Intervention Category Major Interventions: Other:  Lenell Antu 07/03/2022, 4:23 AM

## 2022-07-03 NOTE — Progress Notes (Signed)
This RN notified by nurse tech of bleeding to pt's nose. This RN assessed and found bleeding from scabbing of nose previously present from pt scratching. Pt noted to also be attempting to remove Cortrak. Safety mitts applied for pt safety and nose cleaned. No other injuries noted. VVS throughout.

## 2022-07-04 ENCOUNTER — Inpatient Hospital Stay (HOSPITAL_COMMUNITY): Payer: No Typology Code available for payment source

## 2022-07-04 DIAGNOSIS — L03116 Cellulitis of left lower limb: Secondary | ICD-10-CM

## 2022-07-04 DIAGNOSIS — R410 Disorientation, unspecified: Secondary | ICD-10-CM

## 2022-07-04 DIAGNOSIS — E876 Hypokalemia: Secondary | ICD-10-CM

## 2022-07-04 DIAGNOSIS — I214 Non-ST elevation (NSTEMI) myocardial infarction: Secondary | ICD-10-CM | POA: Diagnosis not present

## 2022-07-04 LAB — COMPREHENSIVE METABOLIC PANEL
ALT: 100 U/L — ABNORMAL HIGH (ref 0–44)
AST: 110 U/L — ABNORMAL HIGH (ref 15–41)
Albumin: 2.1 g/dL — ABNORMAL LOW (ref 3.5–5.0)
Alkaline Phosphatase: 92 U/L (ref 38–126)
Anion gap: 13 (ref 5–15)
BUN: 37 mg/dL — ABNORMAL HIGH (ref 8–23)
CO2: 30 mmol/L (ref 22–32)
Calcium: 8.3 mg/dL — ABNORMAL LOW (ref 8.9–10.3)
Chloride: 99 mmol/L (ref 98–111)
Creatinine, Ser: 0.98 mg/dL (ref 0.61–1.24)
GFR, Estimated: >60 mL/min (ref >60)
Glucose, Bld: 131 mg/dL — ABNORMAL HIGH (ref 70–99)
Potassium: 3.3 mmol/L — ABNORMAL LOW (ref 3.5–5.1)
Sodium: 142 mmol/L (ref 135–145)
Total Bilirubin: 0.4 mg/dL (ref 0.3–1.2)
Total Protein: 6.5 g/dL (ref 6.5–8.1)

## 2022-07-04 LAB — GLUCOSE, CAPILLARY
Glucose-Capillary: 134 mg/dL — ABNORMAL HIGH (ref 70–99)
Glucose-Capillary: 131 mg/dL — ABNORMAL HIGH (ref 70–99)
Glucose-Capillary: 186 mg/dL — ABNORMAL HIGH (ref 70–99)
Glucose-Capillary: 144 mg/dL — ABNORMAL HIGH (ref 70–99)
Glucose-Capillary: 129 mg/dL — ABNORMAL HIGH (ref 70–99)
Glucose-Capillary: 141 mg/dL — ABNORMAL HIGH (ref 70–99)

## 2022-07-04 LAB — CBC
HCT: 36.8 % — ABNORMAL LOW (ref 39.0–52.0)
Hemoglobin: 11.8 g/dL — ABNORMAL LOW (ref 13.0–17.0)
MCH: 34.3 pg — ABNORMAL HIGH (ref 26.0–34.0)
MCHC: 32.1 g/dL (ref 30.0–36.0)
MCV: 107 fL — ABNORMAL HIGH (ref 80.0–100.0)
Platelets: 291 10*3/uL (ref 150–400)
RBC: 3.44 MIL/uL — ABNORMAL LOW (ref 4.22–5.81)
RDW: 15 % (ref 11.5–15.5)
WBC: 15.4 10*3/uL — ABNORMAL HIGH (ref 4.0–10.5)
nRBC: 0 % (ref 0.0–0.2)

## 2022-07-04 LAB — MAGNESIUM: Magnesium: 2.4 mg/dL (ref 1.7–2.4)

## 2022-07-04 LAB — PHOSPHORUS: Phosphorus: 3.2 mg/dL (ref 2.5–4.6)

## 2022-07-04 MED ORDER — POTASSIUM CHLORIDE 20 MEQ PO PACK
60.0000 meq | PACK | Freq: Once | ORAL | Status: AC
  Administered 2022-07-04: 60 meq
  Filled 2022-07-04: qty 3

## 2022-07-04 MED ORDER — POTASSIUM CHLORIDE 10 MEQ/100ML IV SOLN
10.0000 meq | INTRAVENOUS | Status: AC
  Administered 2022-07-04 (×2): 10 meq via INTRAVENOUS
  Filled 2022-07-04 (×2): qty 100

## 2022-07-04 MED ORDER — POTASSIUM CHLORIDE 20 MEQ PO PACK
40.0000 meq | PACK | Freq: Once | ORAL | Status: AC
  Administered 2022-07-04: 40 meq
  Filled 2022-07-04: qty 2

## 2022-07-04 MED ORDER — QUETIAPINE FUMARATE 25 MG PO TABS
12.5000 mg | ORAL_TABLET | Freq: Every day | ORAL | Status: DC
  Administered 2022-07-05: 12.5 mg
  Filled 2022-07-04: qty 1

## 2022-07-04 MED ORDER — FUROSEMIDE 10 MG/ML IJ SOLN
40.0000 mg | Freq: Once | INTRAMUSCULAR | Status: AC
  Administered 2022-07-04: 40 mg via INTRAVENOUS
  Filled 2022-07-04: qty 4

## 2022-07-04 MED ORDER — QUETIAPINE FUMARATE 25 MG PO TABS
25.0000 mg | ORAL_TABLET | Freq: Every day | ORAL | Status: DC
  Administered 2022-07-04 – 2022-07-05 (×2): 25 mg
  Filled 2022-07-04 (×3): qty 1

## 2022-07-04 NOTE — Progress Notes (Signed)
NAME:  [name] Oneal, MRN:  [id number], DOB:  07-30-1945, LOS: 14 ADMISSION DATE:  06/20/2022, CONSULTATION DATE:  06/21/2022 REFERRING MD:  [name] Mantis, MD CHIEF COMPLAINT:  Alcohol Withdrawal   History of Present Illness:  [name] Oneal is a 77 year old male, daily smoker with CAD who was admitted 3/23 for chest pain noted to have an NSTEMI. 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 He has received 12mg  of IV ativan since night shift and remains very fidgety and restless. CIWA scores are 18-21.   PCCM consulted for concern of progressive alcohol withdrawal.   Worsening oxygenation overnight, now on 80%  Pertinent Medical History:   Past Medical History:  Diagnosis Date   Alcoholic (HCC)    Hypertension   CAD s/p DES in 2003 Tobacco Use  Significant Hospital Events: Including procedures, antibiotic start and stop dates in addition to other pertinent events   3/23 admitted for NSTEMI 3/24 PCCM consulted for alcohol withdrawal 3/27 Agitated overnight requiring restraints. Remains on Precedex. Phenobarb ongoing. Cortrak today. 4/1 remains encephalopathic, though calmer and off precedex 4/2 Mental status initially much improved. Became unresponsive ~1550 prompting Code Stroke. LKW 1540. STAT CT Head/CTA Head/Neck obtained, NAICA/no LVO. Suspect metabolic/non-neurologic deficit. Code Stroke cancelled. 4/3 EEG obtained overnight demonstrating mild diffuse encephalopathy, no seizures. Intermittently agitated/combative with staff. Mitts in place. Failed bedside swallow with PA. 4/4 Pleasant and conversant, no longer combative. Mild intermittent agitation. Daughter at bedside. New LLE swelling/redness/pain, r/o DVT (dopplers negative) 4/5 More agitated and intermittently combative today, +delirious/hallucinating. Uncooperative with injectable medication administration, refusing insulin/Lovenox. Remains on 4L  [place].  Interim History / Subjective:  He wants to go home. He received haldol overnight last night for aggression.  Objective:  Blood pressure (!) 89/57, pulse 77, temperature 98 F (36.7 C), temperature source Axillary, resp. rate (!) 22, height 5\' 10"  (1.778 m), weight 106.4 kg, SpO2 100 %.        Intake/Output Summary (Last 24 hours) at 07/04/2022 1324 Last data filed at 07/04/2022 1200 Gross per 24 hour  Intake 2729.9 ml  Output 3485 ml  Net -755.1 ml    Filed Weights   07/02/22 0359 07/03/22 0257 07/04/22 0415  Weight: 106.5 kg 106.1 kg 106.4 kg   Physical Examination: General: chronically ill appearing man sitting up in bed in NAD HEENT: [place]/AT, eyes anicteric. Cortrak in place.  Neuro: Awake, alert, moving all extremities. Unable to lift his arms over his head but can lift both off the bed for a very short time.  CV: S1S2, RRR PULM: breathing comfortably on [place], reduced basilar breath sounds, no rhales.  Abd : obese, soft, NT Extremities: mild tibial edema, no clubbing Derm: warm, dry, no diffuse rashes  K+ 3.3 BUN 37 Cr 0.98 WBC 15.4 H/H 11.8/36.8 Platelets 291 CXR personally reviewed> bibasilar interstitial infiltrates, likely pulmonary edema  Assessment & Plan:   Acute EtOH withdrawal with delirium tremens> resolved. Now has ICU delirium. H/o EtOH abuse  -seroquel BID; con't valproate. Increase bedtime seroquel dose.  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 EEG negative for seizures. 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 Tobacco abuse  Concern for OSA -con't weaning O2 -completed course of zosyn x 7 days -bronchodilators -pulmonary hygiene -encourage tobacco cessation -may be able to do overnight oximetry prior to discharge; needs OP sleep eval   LLE cellulitis -7 days of linezolid; was completing course of zosyn when this developed so would recommend continuing MRSA coverage  NSTEMI, CAD, HTN -lasix -control HTN- lisinopril metoprolol, hydralazine -tele monitoring -Cardiology can determine the optimal timing for LHC; seems to be getting to a point where this can be completed more safely -aspirin, statin  At risk for malnutrition -SLP consult; with dysarthria not a great candidate for bedside swallow evaluation -TF via cortrak  AKI, likely multifactorial> resolved Hypernatremia> resolved - con't FW via cortrak -renally dose meds, avoid nephrotoxic meds  Hypokalemia -repleted -additional K+ supplementation with diuresis today  Severe deconditioning -PT, OT  Hyperglycemia, prediabetes -needs OP follow up -SSI PRN -goal BG <180  Mild transaminase elevation-- possibly due to valproate? -recheck LFTs periodically  Acute anemia, likely 2/2 critical illness -monitor -transfuse for Hb <7 or hemodynamically significant bleeding   Stable to transfer out of ICU today. TRH to assume care tomorrow. S/o [name] updated via phone. She understands that he needs significant rehabilitation before he can be discharged home.    Best Practice (right click and "Reselect all SmartList Selections" daily)   Diet/type: tubefeeds and NPO DVT prophylaxis: LMWH GI prophylaxis: N/A Lines: N/A Foley:  Yes, and it is no longer needed and removal ordered  Code Status:  DNR   Last date of multidisciplinary goals of care discussion [4/6- con't full scope]  [name] Oneal, daughter, [phone] [name] Oneal, significant other>   [phone]  Critical care time:    [name] DunnLaura P Faylynn Stamos, DO 07/04/22 2:52 PM [facility]  For contact information, see Amion. If no response to pager, please call PCCM consult pager. After hours, 7PM- 7AM, please call Elink.

## 2022-07-05 ENCOUNTER — Inpatient Hospital Stay (HOSPITAL_COMMUNITY): Payer: No Typology Code available for payment source

## 2022-07-05 DIAGNOSIS — I200 Unstable angina: Secondary | ICD-10-CM | POA: Diagnosis not present

## 2022-07-05 LAB — GLUCOSE, CAPILLARY
Glucose-Capillary: 141 mg/dL — ABNORMAL HIGH (ref 70–99)
Glucose-Capillary: 141 mg/dL — ABNORMAL HIGH (ref 70–99)
Glucose-Capillary: 142 mg/dL — ABNORMAL HIGH (ref 70–99)
Glucose-Capillary: 146 mg/dL — ABNORMAL HIGH (ref 70–99)
Glucose-Capillary: 148 mg/dL — ABNORMAL HIGH (ref 70–99)
Glucose-Capillary: 188 mg/dL — ABNORMAL HIGH (ref 70–99)

## 2022-07-05 MED ORDER — IPRATROPIUM-ALBUTEROL 0.5-2.5 (3) MG/3ML IN SOLN
3.0000 mL | Freq: Four times a day (QID) | RESPIRATORY_TRACT | Status: DC
  Administered 2022-07-05 – 2022-07-09 (×16): 3 mL via RESPIRATORY_TRACT
  Filled 2022-07-05 (×14): qty 3

## 2022-07-05 NOTE — Evaluation (Addendum)
Clinical/Bedside Swallow Evaluation Patient Details  Name: Claybon Kocik MRN: [id number] Date of Birth: [birth year]  Today's Date: 07/05/2022 Time: SLP Start Time (ACUTE ONLY): 1421 SLP Stop Time (ACUTE ONLY): 1435 SLP Time Calculation (min) (ACUTE ONLY): 14 min  Past Medical History:  Past Medical History:  Diagnosis Date   Alcoholic (HCC)    Hypertension    Past Surgical History: History reviewed. No pertinent surgical history. HPI:  [name] Oneal is a 77 year old male, daily smoker with CAD who was admitted 3/23 for chest pain noted to have an NSTEMI. Pt admitted to ICU. Course complicated by delirium, withdrawal, code stroke called and cancelled 4/2. Transferred to hospitalist service 4/7. Pt with cortrak in place.  Head CT with no acute findings.  CXR 4/6: "1. Lower lung volumes with increased generalized interstitial opacity. Cannot exclude mild or developing interstitial edema.  2. Patchy left lung base opacity although ventilation there appears improved since 06/30/2022. Favor atelectasis over other considerations." Pt with PMH significant for chronic alcoholism, chronic smoking, CAD.    Assessment / Plan / Recommendation  Clinical Impression  Pt presents with mild oral dysphagia.  There was decreased labial ROM on R side and mild oral residue on R>L which pt was able to fully clear.  Pt is edentulous and endorses consuming regular texture diet at baseline without the use of dentures.  Pt with hoarse/rough vocal quality, but he states that vocal quality is consistent with baseline. Pt tolerated all consistencies trialed, including large serial straw sips of thin liquid with no clinicial indicators of aspiration.  Pt was jovial, but distractible during today's session and has experienced agitation at time.  Recommend supervision with PO intake.    Recommend regular diet with thin liquid.  SLP Visit Diagnosis: Dysphagia, oral phase (R13.11)    Aspiration Risk  Mild aspiration  risk    Diet Recommendation Regular;Thin liquid   Liquid Administration via: Cup;Straw Medication Administration: Whole meds with liquid (as tolerated) Supervision: Full supervision/cueing for compensatory strategies Compensations: Slow rate;Small sips/bites;Minimize environmental distractions Postural Changes: Seated upright at 90 degrees    Other  Recommendations Oral Care Recommendations: Oral care BID    Recommendations for follow up therapy are one component of a multi-disciplinary discharge planning process, led by the attending physician.  Recommendations may be updated based on patient status, additional functional criteria and insurance authorization.  Follow up Recommendations  (Continue ST at next level of care at present, but like to resolve over course of admission as AMS/delirium improves)      Assistance Recommended at Discharge  N/A  Functional Status Assessment Patient has had a recent decline in their functional status and demonstrates the ability to make significant improvements in function in a reasonable and predictable amount of time.  Frequency and Duration min 2x/week  2 weeks       Prognosis Prognosis for improved oropharyngeal function: Good      Swallow Study   General Date of Onset: 06/20/22 HPI: [name] Oneal is a 77 year old male, daily smoker with CAD who was admitted 3/23 for chest pain noted to have an NSTEMI. Pt admitted to ICU. Course complicated by delirium, withdrawal, code stroke called and cancelled 4/2. Transferred to hospitalist service 4/7. Pt with cortrak in place.  Head CT with no acute findings.  CXR 4/6: "1. Lower lung volumes with increased generalized interstitial opacity. Cannot exclude mild or developing interstitial edema.  2. Patchy left lung base opacity although ventilation there appears improved since  06/30/2022. Favor atelectasis over other considerations." Pt with PMH significant for chronic alcoholism, chronic smoking,  CAD. Type of Study: Bedside Swallow Evaluation Previous Swallow Assessment: none Diet Prior to this Study: NPO Temperature Spikes Noted: No Respiratory Status: Nasal cannula History of Recent Intubation: No Behavior/Cognition: Alert;Cooperative;Impulsive;Distractible Oral Cavity Assessment: Dry Oral Care Completed by SLP: No Oral Cavity - Dentition: Edentulous Vision: Functional for self-feeding Self-Feeding Abilities: Able to feed self (with encouragement) Baseline Vocal Quality: Hoarse Volitional Cough: Strong Volitional Swallow: Able to elicit    Oral/Motor/Sensory Function Overall Oral Motor/Sensory Function: Within functional limits Facial ROM: Reduced right Facial Symmetry: Abnormal symmetry right Lingual ROM: Within Functional Limits Lingual Symmetry: Within Functional Limits Lingual Strength: Reduced Velum: Within Functional Limits Mandible: Within Functional Limits   Ice Chips Ice chips: Not tested   Thin Liquid Thin Liquid: Within functional limits Presentation: Straw    Nectar Thick Nectar Thick Liquid: Not tested   Honey Thick Honey Thick Liquid: Not tested   Puree Puree: Within functional limits Presentation: Spoon   Solid     Solid: Impaired Presentation: Self Fed (SLP fed) Oral Phase Functional Implications: Oral residue      Kerrie Pleasure, MA, CCC-SLP Acute Rehabilitation Services Office: [phone] 07/05/2022,3:09 PM

## 2022-07-05 NOTE — Progress Notes (Addendum)
PROGRESS NOTE  [name] Oneal  DOB: 1945/12/30  PCP: Herby Abraham, MD KRC:[id number]  DOA: 06/20/2022  LOS: 15 days  Hospital Day: 16  Brief narrative: [name] Oneal is a 77 y.o. male with PMH significant for chronic alcoholism, chronic smoking, CAD 3/23 Saturday, patient presented to the ED with complaint of chest pain.  He was noted to have NSTEMI and was admitted under cardiology service with a tentative plan to cath on Monday 3/25. 3/24, PCCM was consulted for alcohol withdrawal.  He was also hypoxic, suspected aspiration pneumonia.  Patient was transferred to ICU, subsequently started on Precedex drip, phenobarb.  Core track feeding was started. For the next several days in ICU, patient remains encephalopathic.  His mental status improved but kept waxing and waning. CT head was unremarkable.  EEG did not show any evidence of seizures. Gradually improved mental status 4/7, transferred out to [facility]  Subjective: Patient was seen and examined this morning.  Elderly Caucasian male.  Propped up in bed. Alert, awake.  Has core track feeding ongoing He is upset that he is not allowed to eat.  No family at bedside. Chart reviewed In the last 24 hours, no fever, heart rate in 70s and 80s, breathing in 20s on 2 L oxygen, blood pressure fluctuating, mostly in 120s and 130s Last set of labs from yesterday morning with WBC count 15.4, hemoglobin 11.8, potassium low at 3.3 No labs ordered for today.  Assessment and plan: Acute toxic metabolic encephalopathy Acute EtOH withdrawal with delirium tremens Initially transferred to ICU for alcohol withdrawal symptoms.  Withdrawal symptoms resolved but now has ICU delirium.  While in ICU, patient required Precedex drip and phenobarbital. Currently on Seroquel twice daily, valproic acid, PRN Haldol Needs frequent reorientation  Stroke ruled out While in ICU, he was also suspected to have right hemineglect and hence stroke workup was  done.  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  Chronic alcoholism Elevated liver enzymes Elevated ammonia Need to rule out liver cirrhosis.  Obtain ultrasound right upper quadrant Continue monitor LFTs Ammonia elevation could also be partly due to valproic acid. Currently on lactulose. Recent Labs  Lab 06/29/22 0556 06/30/22 0110 06/30/22 1704 07/01/22 0327 07/02/22 0149 07/04/22 0702  AST  --   --   --   --   --  110*  ALT  --   --   --   --   --  100*  ALKPHOS  --   --   --   --   --  92  BILITOT  --   --   --   --   --  0.4  PROT  --   --   --   --   --  6.5  ALBUMIN 2.7*  --   --   --   --  2.1*  AMMONIA  --   --  37*  --   --   --   PLT 227 266  --  282 285 291     Acute hypoxemic respiratory failure Aspiration pneumonia, resolved COPD exacerbation Chronic smoker Concern for OSA completed course of zosyn x 7 days Continue bronchodilators, pulmonary toileting Encouraged tobacco cessation Currently on 2 L oxygen nasal cannula.  Wean down as tolerated. May need outpatient sleep eval  NSTEMI History of CAD Initially admitted under cardiology service for NSTEMI.  Could not get cardiac cath as planned  Currently on aspirin Cardiology to follow Continue telemetry  Chronic diastolic CHF  Essential hypertension Echo 3/27 with EF 50 to 55%, grade 2 diastolic dysfunction, moderately elevated pulmonary artery systolic pressure Currently blood pressure is controlled on Lopressor 50 mg twice daily, clonidine 0.2 mg 3 times daily, lisinopril 20 mg daily, hydralazine as needed   AKI Resolved. Recent Labs    06/24/22 0050 06/25/22 0541 06/26/22 0655 06/27/22 0141 06/28/22 0229 06/29/22 0556 06/30/22 0110 07/01/22 0327 07/02/22 0149 07/04/22 0702  BUN 17 27* 36* 40* 44* 54* 59* 60* 51* 37*  CREATININE 0.99 1.00 1.10 1.09 1.02 1.20 1.28* 1.34* 1.30* 0.98    Hypernatremia Improving on tube feeding. Recent Labs  Lab 06/29/22 0556 06/30/22 0110 06/30/22 1703 07/01/22 0327 07/02/22 0149 07/04/22 0702  NA 147* 148* 148* 147* 146* 142   Hypokalemia Potassium low at 3.3 on last check yesterday.  Replacement given.  Repeat Recent Labs  Lab 06/29/22 0556 06/30/22 0110 06/30/22 1703 07/01/22 0327 07/02/22 0149 07/04/22 0702  K 3.9 3.8 3.2* 3.3* 3.6 3.3*  MG 2.5* 2.7*  --  2.8* 2.9* 2.4  PHOS 4.6  --   --   --  3.1 3.2   Prediabetes A1c 5.9 on 3/27 Currently in sliding scale insulin while on tube feeding. Recent Labs  Lab 07/04/22 2050 07/04/22 2359 07/05/22 0435 07/05/22 0818 07/05/22 1136  GLUCAP 141* 188* 142* 141* 141*   Dysphagia Moderate protein calorie malnutrition Seen by SLP and nutritionist Core track feeding ongoing.  Macrocytic anemia Vitamin B12, folate deficiency Hemoglobin stable. Continue vitamin B12 and folate supplement Recent Labs    06/20/22 0545 06/21/22 0239 06/30/22 0110 06/30/22 1703 07/01/22 0327 07/02/22 0149 07/04/22 0702  HGB  --    < > 13.7 13.6 13.0 12.7* 11.8*  MCV  --    < > 109.9*  --  110.6* 108.7* 107.0*  VITAMINB12 130*  --   --   --   --   --   --   FOLATE 3.1*  --   --   --   --   --   --    < > = values in this interval not displayed.   Acute urinary retention More than 600 mL urine retention noted today.  In and out catheterization for now.  May need Foley catheter.  LLE cellulitis Clinically improving. currently on 7 day course of linezolid to complete 10/9  Severe deconditioning Impaired mobility PT, OT following.  Mobility: SNF recommended  Goals of care   Code Status: DNR     DVT prophylaxis:  enoxaparin (LOVENOX) injection 40 mg Start: 06/24/22 1130   Antimicrobials: Zyvox Fluid: Not on IV hydration Consultants: None currently Family Communication: None at bedside  Status: Inpatient Level of care:  Telemetry Cardiac   Needs to continue  in-hospital care:  Has core track feeding.  Mental status off and on  Patient from: Home Anticipated d/c to: Pending clinical course   Diet:  Diet Order             Diet regular Fluid consistency: Thin  Diet effective now                   Scheduled Meds:  arformoterol  15 mcg Nebulization BID   aspirin  81 mg Per Tube Daily   atorvastatin  80 mg Per Tube Daily   Chlorhexidine Gluconate Cloth  6 each Topical Daily   cloNIDine  0.2 mg Per Tube TID   enoxaparin (  LOVENOX) injection  40 mg Subcutaneous Q24H   fiber supplement (BANATROL TF)  60 mL Per Tube BID   folic acid  1 mg Per Tube Daily   free water  300 mL Per Tube Q2H   Gerhardt's butt cream   Topical BID   guaiFENesin  1,200 mg Oral BID   insulin aspart  0-9 Units Subcutaneous Q4H   insulin glargine-yfgn  8 Units Subcutaneous Daily   ipratropium-albuterol  3 mL Nebulization Q4H   lactulose  30 g Per Tube TID   lisinopril  20 mg Per Tube Daily   metoprolol tartrate  50 mg Per Tube BID   multivitamin with minerals  1 tablet Oral Daily   nicotine  21 mg Transdermal Daily   nutrition supplement (JUVEN)  1 packet Per Tube BID BM   mouth rinse  15 mL Mouth Rinse 4 times per day   QUEtiapine  12.5 mg Per Tube Q1400   QUEtiapine  25 mg Per Tube QHS   revefenacin  175 mcg Nebulization Daily   sodium chloride flush  3 mL Intravenous Q12H   thiamine  100 mg Per Tube Daily   valproic acid  250 mg Per Tube BID   vitamin B-12  500 mcg Per Tube Daily   white petrolatum   Topical BID    PRN meds: sodium chloride, albuterol, haloperidol lactate, hydrALAZINE, nitroGLYCERIN, mouth rinse, sodium chloride flush   Infusions:   sodium chloride Stopped (07/04/22 1413)   feeding supplement (VITAL AF 1.2 CAL) 1,000 mL (07/05/22 1420)   linezolid (ZYVOX) IV 600 mg (07/05/22 0902)    Antimicrobials: Anti-infectives (From admission, onward)    Start     Dose/Rate Route Frequency Ordered Stop   07/01/22 1215  linezolid (ZYVOX)  IVPB 600 mg        600 mg 300 mL/hr over 60 Minutes Intravenous Every 12 hours 07/01/22 1121 07/07/22 2359   06/28/22 1400  piperacillin-tazobactam (ZOSYN) IVPB 3.375 g        3.375 g 12.5 mL/hr over 240 Minutes Intravenous Every 8 hours 06/28/22 0941 07/03/22 0910   06/27/22 1100  cefTRIAXone (ROCEPHIN) 2 g in sodium chloride 0.9 % 100 mL IVPB  Status:  Discontinued        2 g 200 mL/hr over 30 Minutes Intravenous Every 24 hours 06/27/22 1012 06/28/22 0941   06/26/22 1200  ceFEPIme (MAXIPIME) 2 g in sodium chloride 0.9 % 100 mL IVPB  Status:  Discontinued        2 g 200 mL/hr over 30 Minutes Intravenous Every 8 hours 06/26/22 1051 06/27/22 1012       Nutritional status:  Body mass index is 35.05 kg/m.  Nutrition Problem: Moderate Malnutrition Etiology: acute illness Signs/Symptoms: meal completion < 50%, energy intake < 75% for > 7 days, mild muscle depletion, moderate muscle depletion     Objective: Vitals:   07/05/22 0733 07/05/22 0735  BP: (!) 172/84   Pulse: 90   Resp: (!) 21   Temp: 99.1 F (37.3 C)   SpO2: 93% 94%    Intake/Output Summary (Last 24 hours) at 07/05/2022 1453 Last data filed at 07/05/2022 1100 Gross per 24 hour  Intake 499.09 ml  Output 2900 ml  Net -2400.91 ml   Filed Weights   07/03/22 0257 07/04/22 0415 07/05/22 0200  Weight: 106.1 kg 106.4 kg 110.8 kg   Weight change: 4.4 kg Body mass index is 35.05 kg/m.   Physical Exam: General exam: Elderly Caucasian male.  Not in distress Skin: No rashes, lesions or ulcers. HEENT: Atraumatic, normocephalic, no obvious bleeding.  Cortrak feeding ongoing Lungs: Clear to auscultation bilaterally CVS: Regular rate and rhythm, normal GI/Abd: Soft, nontender, distended.  I wonder if he has underlying ascites.  Bowel sound present CNS: Alert, awake, limited communication Psychiatry: Frustrated Extremities: Pedal edema 1+ bilaterally, no calf tenderness  Data Review: I have personally reviewed the  laboratory data and studies available.  F/u labs ordered Unresulted Labs (From admission, onward)     Start     Ordered   07/04/22 0500  CBC  Every 48 hours,   R     Question:  Specimen collection method  Answer:  Lab=Lab collect   07/02/22 1510   07/04/22 0500  Comprehensive metabolic panel  Every 48 hours,   R     Question:  Specimen collection method  Answer:  Lab=Lab collect   07/02/22 1510   07/04/22 0500  Magnesium  Every 48 hours,   R     Question:  Specimen collection method  Answer:  Lab=Lab collect   07/02/22 1510   07/04/22 0500  Phosphorus  Every 48 hours,   R     Question:  Specimen collection method  Answer:  Lab=Lab collect   07/02/22 1510            Total time spent in review of labs and imaging, patient evaluation, formulation of plan, documentation and communication with family: 55 minutes  Signed, Lorin Glass, MD [facility] 07/05/2022

## 2022-07-05 NOTE — Progress Notes (Signed)
Pt did not tolerate bipap at this tim

## 2022-07-05 NOTE — Progress Notes (Signed)
   07/05/22 0002  Assess: MEWS Score  BP 131/60  MAP (mmHg) 82  Pulse Rate 66  ECG Heart Rate 66  Resp (!) 26  SpO2 95 %  O2 Device Nasal Cannula  Assess: MEWS Score  MEWS Temp 0  MEWS Systolic 0  MEWS Pulse 0  MEWS RR 2  MEWS LOC 0  MEWS Score 2  MEWS Score Color Yellow  Assess: if the MEWS score is Yellow or Red  Were vital signs taken at a resting state? Yes  Focused Assessment No change from prior assessment  Does the patient meet 2 or more of the SIRS criteria? No  MEWS guidelines implemented  Yes, yellow  Treat  MEWS Interventions Considered administering scheduled or prn medications/treatments as ordered  Take Vital Signs  Increase Vital Sign Frequency  Yellow: Q2hr x1, continue Q4hrs until patient remains green for 12hrs  Escalate  MEWS: Escalate Yellow: Discuss with charge nurse and consider notifying provider and/or RRT  Notify: Charge Nurse/RN  Name of Charge Nurse/RN Notified Noreene Larsson  Assess: SIRS CRITERIA  SIRS Temperature  0  SIRS Pulse 0  SIRS Respirations  1  SIRS WBC 0  SIRS Score Sum  1

## 2022-07-05 NOTE — TOC Initial Note (Signed)
Transition of Care [facility]) - Initial/Assessment Note    Patient Details  Name: [name] Oneal MRN: [id number] Date of Birth: 1945-06-22  Transition of Care [facility]) CM/SW Contact:    Delilah Shan, LCSWA Phone Number: 07/05/2022, 3:26 PM  Clinical Narrative:                  CSW received consult for possible SNF placement at time of discharge. Due to patients current orientation CSW LVM for patients significant other Melody. CSW awaiting call back to discuss PT recommendation of SNF placement for patient at time of discharge. CSW to continue to follow and assist with discharge planning needs.   Expected Discharge Plan:  (TBD) Barriers to Discharge: Continued Medical Work up   Patient Goals and CMS Choice            Expected Discharge Plan and Services   Discharge Planning Services: CM Consult   Living arrangements for the past 2 months: Single Family Home                                      Prior Living Arrangements/Services Living arrangements for the past 2 months: Single Family Home Lives with:: Significant Other          Need for Family Participation in Patient Care: Yes (Comment) Care giver support system in place?: Yes (comment)      Activities of Daily Living Home Assistive Devices/Equipment: Cane (specify quad or straight) ADL Screening (condition at time of admission) Patient's cognitive ability adequate to safely complete daily activities?: No Is the patient deaf or have difficulty hearing?: No Does the patient have difficulty seeing, even when wearing glasses/contacts?: No Does the patient have difficulty concentrating, remembering, or making decisions?: No Patient able to express need for assistance with ADLs?: Yes Does the patient have difficulty dressing or bathing?: No Independently performs ADLs?: Yes (appropriate for developmental age) Does the patient have difficulty walking or climbing stairs?: No Weakness of Legs: Both Weakness of  Arms/Hands: None  Permission Sought/Granted Permission sought to share information with : Case Manager, Family Supports                Emotional Assessment           Psych Involvement: No (comment)  Admission diagnosis:  Unstable angina [I20.0] NSTEMI (non-ST elevated myocardial infarction) [I21.4] Hypertensive urgency [I16.0] Patient Active Problem List   Diagnosis Date Noted   AKI (acute kidney injury) 07/01/2022   Moderate protein-energy malnutrition 06/30/2022   Pneumonia of left lower lobe due to infectious organism 06/30/2022   Hypernatremia 06/29/2022   Aspiration pneumonia of right lower lobe 06/29/2022   Alcohol withdrawal syndrome, with delirium 06/29/2022   Delirium tremens 06/23/2022   Hyperglycemia 06/23/2022   Acute respiratory failure with hypoxia and hypercapnia 06/22/2022   COPD with acute exacerbation 06/22/2022   Acute metabolic encephalopathy 06/22/2022   NSTEMI (non-ST elevated myocardial infarction) 06/22/2022   Unstable angina 06/20/2022   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 AT [address] OF [facility] [address] [place] [address] Phone: [phone] Fax: [phone]     Social Determinants of Health (SDOH) Social History: SDOH Screenings   Food Insecurity: No Food Insecurity (06/20/2022)  Housing: Low Risk  (  06/20/2022)  Transportation Needs: No Transportation Needs (06/20/2022)  Utilities: Not At Risk (06/20/2022)  Tobacco Use: High Risk (06/20/2022)   SDOH Interventions:     Readmission Risk Interventions     No data to display

## 2022-07-06 DIAGNOSIS — I200 Unstable angina: Secondary | ICD-10-CM | POA: Diagnosis not present

## 2022-07-06 LAB — COMPREHENSIVE METABOLIC PANEL
ALT: 113 U/L — ABNORMAL HIGH (ref 0–44)
AST: 110 U/L — ABNORMAL HIGH (ref 15–41)
Albumin: 2 g/dL — ABNORMAL LOW (ref 3.5–5.0)
Alkaline Phosphatase: 100 U/L (ref 38–126)
Anion gap: 14 (ref 5–15)
BUN: 42 mg/dL — ABNORMAL HIGH (ref 8–23)
CO2: 26 mmol/L (ref 22–32)
Calcium: 8.7 mg/dL — ABNORMAL LOW (ref 8.9–10.3)
Chloride: 96 mmol/L — ABNORMAL LOW (ref 98–111)
Creatinine, Ser: 0.99 mg/dL (ref 0.61–1.24)
GFR, Estimated: >60 mL/min (ref >60)
Glucose, Bld: 130 mg/dL — ABNORMAL HIGH (ref 70–99)
Potassium: 4.4 mmol/L (ref 3.5–5.1)
Sodium: 136 mmol/L (ref 135–145)
Total Bilirubin: 0.6 mg/dL (ref 0.3–1.2)
Total Protein: 6.6 g/dL (ref 6.5–8.1)

## 2022-07-06 LAB — PHOSPHORUS: Phosphorus: 4.5 mg/dL (ref 2.5–4.6)

## 2022-07-06 LAB — GLUCOSE, CAPILLARY
Glucose-Capillary: 103 mg/dL — ABNORMAL HIGH (ref 70–99)
Glucose-Capillary: 113 mg/dL — ABNORMAL HIGH (ref 70–99)
Glucose-Capillary: 131 mg/dL — ABNORMAL HIGH (ref 70–99)
Glucose-Capillary: 136 mg/dL — ABNORMAL HIGH (ref 70–99)
Glucose-Capillary: 162 mg/dL — ABNORMAL HIGH (ref 70–99)
Glucose-Capillary: 180 mg/dL — ABNORMAL HIGH (ref 70–99)
Glucose-Capillary: 98 mg/dL (ref 70–99)

## 2022-07-06 LAB — CBC
HCT: 36.1 % — ABNORMAL LOW (ref 39.0–52.0)
Hemoglobin: 11.3 g/dL — ABNORMAL LOW (ref 13.0–17.0)
MCH: 33.8 pg (ref 26.0–34.0)
MCHC: 31.3 g/dL (ref 30.0–36.0)
MCV: 108.1 fL — ABNORMAL HIGH (ref 80.0–100.0)
Platelets: 367 10*3/uL (ref 150–400)
RBC: 3.34 MIL/uL — ABNORMAL LOW (ref 4.22–5.81)
RDW: 15.1 % (ref 11.5–15.5)
WBC: 16.5 10*3/uL — ABNORMAL HIGH (ref 4.0–10.5)
nRBC: 0 % (ref 0.0–0.2)

## 2022-07-06 LAB — AMMONIA: Ammonia: 39 umol/L — ABNORMAL HIGH (ref 9–35)

## 2022-07-06 LAB — MAGNESIUM: Magnesium: 2.3 mg/dL (ref 1.7–2.4)

## 2022-07-06 MED ORDER — ASPIRIN 81 MG PO CHEW
81.0000 mg | CHEWABLE_TABLET | Freq: Every day | ORAL | Status: DC
  Administered 2022-07-07 – 2022-07-14 (×8): 81 mg via ORAL
  Filled 2022-07-06 (×8): qty 1

## 2022-07-06 MED ORDER — JUVEN PO PACK
1.0000 | PACK | Freq: Two times a day (BID) | ORAL | Status: DC
  Administered 2022-07-06 – 2022-07-14 (×13): 1 via ORAL
  Filled 2022-07-06 (×11): qty 1

## 2022-07-06 MED ORDER — LACTULOSE 10 GM/15ML PO SOLN
30.0000 g | Freq: Three times a day (TID) | ORAL | Status: DC
  Administered 2022-07-06 – 2022-07-08 (×7): 30 g via ORAL
  Filled 2022-07-06 (×7): qty 45

## 2022-07-06 MED ORDER — FUROSEMIDE 10 MG/ML IJ SOLN
40.0000 mg | Freq: Every day | INTRAMUSCULAR | Status: DC
  Administered 2022-07-06 – 2022-07-08 (×3): 40 mg via INTRAVENOUS
  Filled 2022-07-06 (×3): qty 4

## 2022-07-06 MED ORDER — THIAMINE MONONITRATE 100 MG PO TABS
100.0000 mg | ORAL_TABLET | Freq: Every day | ORAL | Status: DC
  Administered 2022-07-07 – 2022-07-14 (×8): 100 mg via ORAL
  Filled 2022-07-06 (×8): qty 1

## 2022-07-06 MED ORDER — VITAMIN B-12 100 MCG PO TABS
500.0000 ug | ORAL_TABLET | Freq: Every day | ORAL | Status: DC
  Administered 2022-07-07 – 2022-07-14 (×8): 500 ug via ORAL
  Filled 2022-07-06 (×8): qty 5

## 2022-07-06 MED ORDER — JUVEN PO PACK: 1.0000 | PACK | Freq: Two times a day (BID) | ORAL | Status: DC

## 2022-07-06 MED ORDER — CLONIDINE HCL 0.2 MG PO TABS
0.2000 mg | ORAL_TABLET | Freq: Three times a day (TID) | ORAL | Status: AC
  Administered 2022-07-06 – 2022-07-07 (×5): 0.2 mg via ORAL
  Filled 2022-07-06 (×5): qty 1

## 2022-07-06 MED ORDER — LACTULOSE 10 GM/15ML PO SOLN: 30.0000 g | Freq: Three times a day (TID) | ORAL | Status: DC

## 2022-07-06 MED ORDER — BANATROL TF EN LIQD
60.0000 mL | Freq: Two times a day (BID) | ENTERAL | Status: DC
  Administered 2022-07-06 – 2022-07-14 (×15): 60 mL via ORAL
  Filled 2022-07-06 (×16): qty 60

## 2022-07-06 MED ORDER — METOPROLOL TARTRATE 50 MG PO TABS
50.0000 mg | ORAL_TABLET | Freq: Two times a day (BID) | ORAL | Status: DC
  Administered 2022-07-06 – 2022-07-10 (×8): 50 mg via ORAL
  Filled 2022-07-06 (×8): qty 1

## 2022-07-06 MED ORDER — QUETIAPINE FUMARATE 25 MG PO TABS
12.5000 mg | ORAL_TABLET | Freq: Every day | ORAL | Status: DC
  Administered 2022-07-06: 12.5 mg via ORAL
  Filled 2022-07-06: qty 1

## 2022-07-06 MED ORDER — LISINOPRIL 20 MG PO TABS
20.0000 mg | ORAL_TABLET | Freq: Every day | ORAL | Status: DC
  Administered 2022-07-07 – 2022-07-14 (×8): 20 mg via ORAL
  Filled 2022-07-06 (×8): qty 1

## 2022-07-06 MED ORDER — VITAL AF 1.2 CAL PO LIQD
1000.0000 mL | ORAL | Status: DC
  Administered 2022-07-06: 1000 mL
  Filled 2022-07-06 (×4): qty 1000

## 2022-07-06 MED ORDER — FREE WATER
100.0000 mL | Status: DC
  Administered 2022-07-06 – 2022-07-07 (×7): 100 mL

## 2022-07-06 MED ORDER — BANATROL TF EN LIQD: 60.0000 mL | Freq: Two times a day (BID) | ENTERAL | Status: DC

## 2022-07-06 MED ORDER — QUETIAPINE FUMARATE 25 MG PO TABS
25.0000 mg | ORAL_TABLET | Freq: Every day | ORAL | Status: DC
  Administered 2022-07-07: 25 mg via ORAL
  Filled 2022-07-06 (×2): qty 1

## 2022-07-06 MED ORDER — ATORVASTATIN CALCIUM 80 MG PO TABS
80.0000 mg | ORAL_TABLET | Freq: Every day | ORAL | Status: DC
  Administered 2022-07-07 – 2022-07-14 (×8): 80 mg via ORAL
  Filled 2022-07-06 (×8): qty 1

## 2022-07-06 NOTE — Progress Notes (Signed)
Nutrition Follow-up  DOCUMENTATION CODES:   Non-severe (moderate) malnutrition in context of acute illness/injury (possible component of social/environmental)  INTERVENTION:  Communicate with RN about TF plans  Transition to nocturnal feeds to allow patient to regain an appetite during the day and hopefully improve with po intake  Initiate nocturnal TF's:  Vital AF 1.2 @70  from 1800-0600 (840 ml) + 100 ml q4 hrs free water+ Juven BID provides 1008 kcal (50%), 63 g protein (55%), 981 ml fluid  Secure message MD about TF plans  Encourage po intake  Continue Banatrol TF PRN  Continue Juven BID -- patient can either drink this po or have it administered through his feeding tube   NUTRITION DIAGNOSIS:   Moderate Malnutrition related to acute illness as evidenced by meal completion < 50%, energy intake < 75% for > 7 days, mild muscle depletion, moderate muscle depletion.   GOAL:   Patient will meet greater than or equal to 90% of their needs   MONITOR:   TF tolerance, Labs, Weight trends, Skin  REASON FOR ASSESSMENT:   Consult Enteral/tube feeding initiation and management  ASSESSMENT:   77 y.o. male admits related to chest pain. PMH includes: alcoholic, HTN. Pt is currently receiving medical management related to unstable angina.  Labs: Glu 130, BUN 42 Meds: banatrol TF, folvite, lasix, insulin, chronulac, MVI w/ minerals, nicoderm, seroquel, thiamine, B12 Wt: admit wt- 240#; CBW-244#  PO: no meals documented since 3/23  TF's: Vital 1.5 @70  + 100 ml q4 hrs free water + juven BID  I/O's:  -2.5 L   Visited patient at bedside who is confused. RD observed his TF's to be off and a regular breakfast tray on his bedside table which included pancakes and sausage that had not been eaten yet. RD secure messaged patient's RN who reports she fed him breakfast this morning and he ate 30-40%. RN about turning TF's back on who states the MD wants to increase po intake.   Patient was  cleared by SLP for a regular diet. Patient was noted to require cues to swallow and he did not feel like eating much. RD is concerned patient's po intake will be too inadequate to meet his nutrition needs due to mentation. Per SLP patient needs guidance while eating to avoid pocketing food.  RD secure messaged Dr. Pola Corn, who is ok with leaving Cortrak in for one more day to make sure he is eating adequately.  Patient reports his abdomen feels much better. He denies N/V/D/C.    Diet Order:   Diet Order             Diet regular Fluid consistency: Thin  Diet effective now                   EDUCATION NEEDS:   Not appropriate for education at this time  Skin:  Skin Assessment: Skin Integrity Issues: Skin Integrity Issues:: Unstageable, DTI DTI: bilateral heels, right posterior thigh Unstageable: bridge of nose from Bipap  Last BM:  4/7  Height:   Ht Readings from Last 1 Encounters:  06/20/22 5\' 10"  (1.778 m)    Weight:   Wt Readings from Last 1 Encounters:  07/06/22 110.8 kg    BMI:  Body mass index is 35.05 kg/m.  Estimated Nutritional Needs:   Kcal:  2000-2200 kcals  Protein:  115-135 g  Fluid:  >/= 2L    Leodis Rains, RDN, LDN  Clinical Nutrition

## 2022-07-06 NOTE — Progress Notes (Signed)
Physical Therapy Treatment Patient Details Name: [name] Oneal MRN: [id number] DOB: 08-16-1945 Today's Date: 07/06/2022   History of Present Illness 77 y/o gentleman presented with CP and was found to have an NSTEMI.  Hospital course has been complicated by severe alcohol withdrawal and delirium tremens. PMH: CAD, tobacco and ETOH abuse    PT Comments    Pt alert this date however very tangential, remains to have delirium,  speaks in appropriately, decreased insight to safety and deficits, and requires maxAx2 for transfer to EOB. Pt continues to benefit from inpatient therapy upon d/c as pt was indep PTA and now currently requires 24/7 assist as pt deconditioned and at high fall risk. Acute PT to cont to follow.   Recommendations for follow up therapy are one component of a multi-disciplinary discharge planning process, led by the attending physician.  Recommendations may be updated based on patient status, additional functional criteria and insurance authorization.  Follow Up Recommendations  Can patient physically be transported by private vehicle: No    Assistance Recommended at Discharge Frequent or constant Supervision/Assistance  Patient can return home with the following Two people to help with walking and/or transfers;Two people to help with bathing/dressing/bathroom;Assistance with cooking/housework;Assistance with feeding;Direct supervision/assist for medications management;Direct supervision/assist for financial management;Assist for transportation;Help with stairs or ramp for entrance   Equipment Recommendations   (TBD)    Recommendations for Other Services       Precautions / Restrictions Precautions Precautions: Fall Precaution Comments: delerium Restrictions Weight Bearing Restrictions: No     Mobility  Bed Mobility Overal bed mobility: Needs Assistance Bed Mobility: Supine to Sit, Sit to Supine     Supine to sit: Max assist, +2 for physical  assistance Sit to supine: Max assist, +2 for physical assistance   General bed mobility comments: max directional verbal cues, pt able to move LEs to EOB, maxAx2 for trunk elevation and to scoot to EOB. pt with significant posterior lean requiring max verbal and tactile cues to maintain trunk forward    Transfers                   General transfer comment: did not attempt due to pt speaking inappropriately with PT and tech    Ambulation/Gait                   Stairs             Wheelchair Mobility    Modified Rankin (Stroke Patients Only)       Balance Overall balance assessment: History of Falls, Needs assistance Sitting-balance support: Feet supported, Bilateral upper extremity supported (requires outside assist to maintain balance) Sitting balance-Leahy Scale: Poor Sitting balance - Comments: pt with significant posterior bias and would only self correct <50% of time requiring posterior assist to maintain upright position                                    Cognition Arousal/Alertness: Awake/alert Behavior During Therapy: Restless Overall Cognitive Status: Impaired/Different from baseline Area of Impairment: Orientation, Attention, Memory, Following commands, Safety/judgement, Awareness, Problem solving                 Orientation Level: Disoriented to, Place, Situation (pt did state it was april but couldn't state correct birthdate, current year or place other than somewhere in [place]) Current Attention Level: Sustained Memory: Decreased short-term memory Following Commands: Follows one step commands  with increased time Safety/Judgement: Decreased awareness of safety, Decreased awareness of deficits Awareness: Intellectual Problem Solving: Slow processing, Decreased initiation, Difficulty sequencing, Requires verbal cues, Requires tactile cues General Comments: pt tangential and unable to stay focused on task or  conversation. Pt easily distracted, pt with inappropriate verbage at times but not combative as was stated in previous sessions.        Exercises General Exercises - Lower Extremity Ankle Circles/Pumps: AROM, Both, 5 reps, Seated Long Arc Quad: AROM, Both, 5 reps, Seated    General Comments General comments (skin integrity, edema, etc.): pt with noted pressure wounds on bilat feet and lateral L lower leg      Pertinent Vitals/Pain Pain Assessment Pain Assessment: Faces Pain Score: 0-No pain    Home Living                          Prior Function            PT Goals (current goals can now be found in the care plan section) Acute Rehab PT Goals Patient Stated Goal: get out of the bed PT Goal Formulation: Patient unable to participate in goal setting Time For Goal Achievement: 07/17/22 Potential to Achieve Goals: Poor Progress towards PT goals: Progressing toward goals    Frequency    Min 1X/week      PT Plan Current plan remains appropriate    Co-evaluation              AM-PAC PT "6 Clicks" Mobility   Outcome Measure  Help needed turning from your back to your side while in a flat bed without using bedrails?: Total Help needed moving from lying on your back to sitting on the side of a flat bed without using bedrails?: Total Help needed moving to and from a bed to a chair (including a wheelchair)?: Total Help needed standing up from a chair using your arms (e.g., wheelchair or bedside chair)?: Total Help needed to walk in hospital room?: Total Help needed climbing 3-5 steps with a railing? : Total 6 Click Score: 6    End of Session Equipment Utilized During Treatment: Gait belt;Oxygen Activity Tolerance: Patient limited by pain;Patient limited by lethargy Patient left: in bed;with call bell/phone within reach;with bed alarm set;Other (comment) (bed in chair position) Nurse Communication: Mobility status PT Visit Diagnosis: Unsteadiness on feet  (R26.81);Other abnormalities of gait and mobility (R26.89);Repeated falls (R29.6);Muscle weakness (generalized) (M62.81);History of falling (Z91.81);Difficulty in walking, not elsewhere classified (R26.2);Other symptoms and signs involving the nervous system (R29.898);Adult, failure to thrive (R62.7);Pain Pain - part of body:  (multiple pressure injuries)     Time: 7322-0254 PT Time Calculation (min) (ACUTE ONLY): 25 min  Charges:  $Therapeutic Exercise: 8-22 mins $Therapeutic Activity: 8-22 mins                     Lewis Shock, PT, DPT Acute Rehabilitation Services Secure chat preferred Office #: [phone]    Iona Hansen 07/06/2022, 11:12 AM

## 2022-07-06 NOTE — TOC Progression Note (Signed)
Transition of Care [facility]) - Progression Note    Patient Details  Name: [name] Oneal MRN: [id number] Date of Birth: [birth year]  [facility]) CM/SW Contact  Delilah Shan, LCSWA Phone Number: 07/06/2022, 2:17 PM  Clinical Narrative:     CSW received consult for possible SNF placement at time of discharge. Due to patients current orientation CSW spoke with patients daughter [name] regarding PT recommendation of SNF placement at time of discharge. Patients daughter expressed understanding of PT recommendation for patient and is agreeable to SNF placement at time of discharge. Patients daughter gave CSW permission to fax out for SNF placement for patient.CSW discussed insurance authorization process with patients daughter. No further questions reported at this time. CSW to continue to follow and assist with discharge planning needs.   Expected Discharge Plan: Skilled Nursing Facility Barriers to Discharge: Continued Medical Work up  Expected Discharge Plan and Services In-house Referral: Clinical Social Work Discharge Planning Services: CM Consult   Living arrangements for the past 2 months: Single Family Home                                       Social Determinants of Health (SDOH) Interventions SDOH Screenings   Food Insecurity: No Food Insecurity (06/20/2022)  Housing: Low Risk  (06/20/2022)  Transportation Needs: No Transportation Needs (06/20/2022)  Utilities: Not At Risk (06/20/2022)  Tobacco Use: High Risk (06/20/2022)    Readmission Risk Interventions     No data to display

## 2022-07-06 NOTE — NC FL2 (Signed)
[place] MEDICAID FL2 LEVEL OF CARE FORM     IDENTIFICATION  Patient Name: [name] Oneal Birthdate: [birth year] Sex: male Admission Date (Current Location): 06/20/2022  [facility] and [place] Number:  Producer, television/film/video and Address:  [facility] [place], [address] [place], [place] [address]      Provider Number: [id number]  Attending Physician Name and Address:  Lorin Glass, MD  Relative Name and Phone Number:  [name] (daughter) [phone]    Current Level of Care: Hospital Recommended Level of Care: Skilled Nursing Facility Prior Approval Number:    Date Approved/Denied:   PASRR Number: [id number]  Discharge Plan: SNF    Current Diagnoses: Patient Active Problem List   Diagnosis Date Noted   AKI (acute kidney injury) 07/01/2022   Moderate protein-energy malnutrition 06/30/2022   Pneumonia of left lower lobe due to infectious organism 06/30/2022   Hypernatremia 06/29/2022   Aspiration pneumonia of right lower lobe 06/29/2022   Alcohol withdrawal syndrome, with delirium 06/29/2022   Delirium tremens 06/23/2022   Hyperglycemia 06/23/2022   Acute respiratory failure with hypoxia and hypercapnia 06/22/2022   COPD with acute exacerbation 06/22/2022   Acute metabolic encephalopathy 06/22/2022   NSTEMI (non-ST elevated myocardial infarction) 06/22/2022   Unstable angina 06/20/2022   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   HYPERTENSION, UNSPECIFIED 07/19/2008   BRONCHITIS, CHRONIC 07/19/2008    Orientation RESPIRATION BLADDER Height & Weight     Self  O2 (Nasal Cannula 2 liters) Continent, External catheter, Urostomy (External Urinary Catheter) Weight: 244 lb 4.3 oz (110.8 kg) Height:  5\' 10"  (177.8 cm)  BEHAVIORAL SYMPTOMS/MOOD NEUROLOGICAL BOWEL NUTRITION STATUS      Incontinent Diet (Please see discharge summary)  AMBULATORY STATUS COMMUNICATION OF NEEDS Skin   Extensive Assist Verbally                          Personal Care Assistance Level of Assistance  Bathing, Feeding, Dressing Bathing Assistance: Maximum assistance Feeding assistance: Maximum assistance Dressing Assistance: Maximum assistance     Functional Limitations Info  Sight, Hearing, Speech Sight Info: Adequate Hearing Info: Adequate Speech Info: Adequate    SPECIAL CARE FACTORS FREQUENCY  PT (By licensed PT), OT (By licensed OT)     PT Frequency: 5x min weekly OT Frequency: 5x min weekly            Contractures Contractures Info: Not present    Additional Factors Info  Code Status, Allergies, Insulin Sliding Scale, Psychotropic Code Status Info: DNR Allergies Info: NKA Psychotropic Info: QUEtiapine (SEROQUEL) tablet 12.5 mg daily,QUEtiapine (SEROQUEL) tablet 25 mg daily at bedtime Insulin Sliding Scale Info: insulin aspart (novoLOG) injection 0-9 Units every 4 hours,insulin glargine-yfgn (SEMGLEE) injection 8 Units daily       Current Medications (07/06/2022):  This is the current hospital active medication list Current Facility-Administered Medications  Medication Dose Route Frequency Provider Last Rate Last Admin   0.9 %  sodium chloride infusion  250 mL Intravenous PRN Marcelino Duster, PA   Stopped at 07/05/22 1053   albuterol (PROVENTIL) (2.5 MG/3ML) 0.083% nebulizer solution 2.5 mg  2.5 mg Nebulization Q6H PRN Martina Sinner, MD       arformoterol [facility]) nebulizer solution 15 mcg  15 mcg Nebulization BID Martina Sinner, MD   15 mcg at 07/06/22 0818   [START ON 07/07/2022] aspirin chewable tablet 81 mg  81 mg Oral Daily Silvana Newness, [facility]       [  START ON 07/07/2022] atorvastatin (LIPITOR) tablet 80 mg  80 mg Oral Daily Silvana NewnessMeyer, Andrew D, RPH       Chlorhexidine Gluconate Cloth 2 % PADS 6 each  6 each Topical Daily Lorin GlassSmith, Daniel C, MD   6 each at 07/06/22 [id number]   cloNIDine (CATAPRES) tablet 0.2 mg  0.2 mg Oral TID Silvana NewnessMeyer, Andrew D, RPH       enoxaparin (LOVENOX) injection 40 mg  40 mg  Subcutaneous Q24H Rollene RotundaHochrein, James, MD   40 mg at 07/06/22 0933   feeding supplement (VITAL AF 1.2 CAL) liquid 1,000 mL  1,000 mL Per Tube Continuous Steffanie Dunnlark, Laura P, DO   Stopped at 07/06/22 [id number]   fiber supplement (BANATROL TF) liquid 60 mL  60 mL Oral BID Lorin Glassahal, Binaya, MD   60 mL at 07/06/22 1000   folic acid (FOLVITE) tablet 1 mg  1 mg Per Tube Daily Cloyd Stagerseese, Stephanie M, PA-C   1 mg at 07/06/22 [id number]   free water 100 mL  100 mL Per Tube Q4H Dahal, Melina SchoolsBinaya, MD   100 mL at 07/06/22 1133   furosemide (LASIX) injection 40 mg  40 mg Intravenous Daily Dahal, Melina SchoolsBinaya, MD   40 mg at 07/06/22 [id number]   Gerhardt's butt cream   Topical BID Steffanie Dunnlark, Laura P, DO   Given at 07/06/22 [id number]   guaiFENesin (MUCINEX) 12 hr tablet 1,200 mg  1,200 mg Oral BID Steffanie Dunnlark, Laura P, DO   1,200 mg at 07/06/22 0914   haloperidol lactate (HALDOL) injection 2 mg  2 mg Intravenous Q6H PRN Tim Laireese, Stephanie M, PA-C   2 mg at 07/04/22 [id number]   hydrALAZINE (APRESOLINE) injection 10 mg  10 mg Intravenous Q4H PRN Selmer DominionSimpson, Paula B, NP   10 mg at 06/27/22 2000   insulin aspart (novoLOG) injection 0-9 Units  0-9 Units Subcutaneous Q4H Karl ItoSommer, Steven E, MD   1 Units at 07/06/22 1152   insulin glargine-yfgn (SEMGLEE) injection 8 Units  8 Units Subcutaneous Daily Karie Fetchlark, Laura P, DO   8 Units at 07/06/22 [id number]   ipratropium-albuterol (DUONEB) 0.5-2.5 (3) MG/3ML nebulizer solution 3 mL  3 mL Nebulization Q6H Dahal, Binaya, MD   3 mL at 07/06/22 1418   lactulose (CHRONULAC) 10 GM/15ML solution 30 g  30 g Oral TID Lorin Glassahal, Binaya, MD   30 g at 07/06/22 0938   linezolid (ZYVOX) IVPB 600 mg  600 mg Intravenous Q12H Steffanie DunnClark, Laura P, DO 300 mL/hr at 07/06/22 1002 600 mg at 07/06/22 1002   [START ON 07/07/2022] lisinopril (ZESTRIL) tablet 20 mg  20 mg Oral Daily Silvana NewnessMeyer, Andrew D, RPH       metoprolol tartrate (LOPRESSOR) tablet 50 mg  50 mg Oral BID Silvana NewnessMeyer, Andrew D, RPH       multivitamin with minerals tablet 1 tablet  1 tablet Oral Daily Len BlalockLoriaux, Daniel B, MD   1  tablet at 07/06/22 [id number]   nicotine (NICODERM CQ - dosed in mg/24 hours) patch 21 mg  21 mg Transdermal Daily Cloyd StagersReese, Stephanie M, PA-C   21 mg at 07/06/22 [id number]   nitroGLYCERIN (NITROSTAT) SL tablet 0.4 mg  0.4 mg Sublingual Q5 Min x 3 PRN Loriaux, Garrison Columbusaniel B, MD       nutrition supplement (JUVEN) (JUVEN) powder packet 1 packet  1 packet Oral BID BM Lorin Glassahal, Binaya, MD   1 packet at 07/06/22 0938   Oral care mouth rinse  15 mL Mouth Rinse 4 times per day Karie Fetchlark, Laura P, DO   15 mL at  07/06/22 1134   Oral care mouth rinse  15 mL Mouth Rinse PRN Karie Fetch P, DO       QUEtiapine (SEROQUEL) tablet 12.5 mg  12.5 mg Oral Q1400 Silvana Newness, RPH       QUEtiapine (SEROQUEL) tablet 25 mg  25 mg Oral QHS Silvana Newness, RPH       revefenacin (YUPELRI) nebulizer solution 175 mcg  175 mcg Nebulization Daily Steffanie Dunn, DO   175 mcg at 07/06/22 0818   sodium chloride flush (NS) 0.9 % injection 3 mL  3 mL Intravenous Q12H Marcelino Duster, PA   3 mL at 07/06/22 9470   sodium chloride flush (NS) 0.9 % injection 3 mL  3 mL Intravenous PRN Marcelino Duster, PA       [START ON 07/07/2022] thiamine (VITAMIN B1) tablet 100 mg  100 mg Oral Daily Silvana Newness, [facility]       [START ON 07/07/2022] vitamin B-12 (CYANOCOBALAMIN) tablet 500 mcg  500 mcg Oral Daily Silvana Newness, RPH       white petrolatum (VASELINE) gel   Topical BID Steffanie Dunn, DO   Given at 07/06/22 9628     Discharge Medications: Please see discharge summary for a list of discharge medications.  Relevant Imaging Results:  Relevant Lab Results:   Additional Information [id number]  Delilah Shan, LCSWA

## 2022-07-06 NOTE — Progress Notes (Signed)
PROGRESS NOTE  [name] Oneal  DOB: [birth year]  PCP: Herby Abraham, MD BJY:[id number]  DOA: 06/20/2022  LOS: 16 days  Hospital Day: 17  Brief narrative: [name] Oneal is a 77 y.o. male with PMH significant for chronic alcoholism, chronic smoking, CAD 3/23 Saturday, patient presented to the ED with complaint of chest pain.  He was noted to have NSTEMI and was admitted under cardiology service with a tentative plan to cath on Monday 3/25. 3/24, PCCM was consulted for alcohol withdrawal.  He was also hypoxic, suspected aspiration pneumonia.  Patient was transferred to ICU, subsequently started on Precedex drip, phenobarb.  Core track feeding was started. For the next several days in ICU, patient remains encephalopathic.  His mental status improved but kept waxing and waning. CT head was unremarkable.  EEG did not show any evidence of seizures. Gradually improved mental status 4/7, transferred out to [facility]  Subjective: Patient was seen and examined this morning.   Lying on bed.  Not in distress.   Alert, awake, core track in place. Also able to take regular diet.  Assessment and plan: Acute toxic metabolic encephalopathy Acute EtOH withdrawal with delirium tremens Initially transferred to ICU for alcohol withdrawal symptoms.  Withdrawal symptoms resolved but now has ICU delirium.  While in ICU, patient required Precedex drip and phenobarbital. Currently on Seroquel twice daily, valproic acid, PRN Haldol Needs frequent reorientation.  Mental status gradually improving.  Stroke ruled out While in ICU, he was also suspected to have right hemineglect and hence stroke workup was done.  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  Chronic alcoholism Elevated liver enzymes Elevated ammonia 4/7, right upper quadrant ultrasound with no evidence of liver cirrhosis. Continue  monitor LFTs Ammonia elevation could also be partly due to valproic acid. Currently on lactulose. Recent Labs  Lab 06/30/22 0110 06/30/22 1704 07/01/22 0327 07/02/22 0149 07/04/22 0702 07/06/22 0530  AST  --   --   --   --  110* 110*  ALT  --   --   --   --  100* 113*  ALKPHOS  --   --   --   --  92 100  BILITOT  --   --   --   --  0.4 0.6  PROT  --   --   --   --  6.5 6.6  ALBUMIN  --   --   --   --  2.1* 2.0*  AMMONIA  --  37*  --   --   --  39*  PLT 266  --  282 285 291 367    Acute hypoxemic respiratory failure Aspiration pneumonia, resolved COPD exacerbation Chronic smoker Concern for OSA completed course of zosyn x 7 days Continue bronchodilators, pulmonary toileting Encouraged tobacco cessation Currently on 2 L oxygen nasal cannula.  Wean down as tolerated. May need outpatient sleep eval  NSTEMI History of CAD Initially admitted under cardiology service for NSTEMI.  Could not get cardiac cath as planned. Currently on aspirin Cardiology to follow Continue telemetry  Chronic diastolic CHF  Essential hypertension Echo 3/27 with EF 50 to 55%, grade 2 diastolic dysfunction, moderately elevated pulmonary artery systolic pressure Currently on Lopressor 50 mg twice daily, clonidine 0.2 mg 3 times daily, lisinopril 20 mg daily, hydralazine as needed  Blood pressure remains elevated to 170s.  Started on Lasix IV 40 mg daily today.  AKI  Resolved. Recent Labs    06/25/22 0541 06/26/22 0655 06/27/22 0141 06/28/22 0229 06/29/22 0556 06/30/22 0110 07/01/22 0327 07/02/22 0149 07/04/22 0702 07/06/22 0530  BUN 27* 36* 40* 44* 54* 59* 60* 51* 37* 42*  CREATININE 1.00 1.10 1.09 1.02 1.20 1.28* 1.34* 1.30* 0.98 0.99    Prediabetes A1c 5.9 on 3/27 Currently in sliding scale insulin while on tube feeding. Recent Labs  Lab 07/05/22 2039 07/06/22 0048 07/06/22 0435 07/06/22 0744 07/06/22 1142  GLUCAP 146* 162* 98 113* 136*    Dysphagia Moderate protein  calorie malnutrition Seen by SLP and nutritionist Core track feeding ongoing.  But it also seems that he is able to tolerate regular diet. Stop Core track feeding today.  Okay to continue free water at a reduced rate of 100 mill every 4 hours for next 24 hours.  Macrocytic anemia Vitamin B12, folate deficiency Hemoglobin stable. Continue vitamin B12 and folate supplement Recent Labs    06/20/22 0545 06/21/22 0239 06/30/22 1703 07/01/22 0327 07/02/22 0149 07/04/22 0702 07/06/22 0530  HGB  --    < > 13.6 13.0 12.7* 11.8* 11.3*  MCV  --    < >  --  110.6* 108.7* 107.0* 108.1*  VITAMINB12 130*  --   --   --   --   --   --   FOLATE 3.1*  --   --   --   --   --   --    < > = values in this interval not displayed.    Acute urinary retention More than 600 mL urine retention noted today.  Foley catheter placed.  LLE cellulitis Clinically improving. currently on 7 day course of linezolid to complete 10/9  Severe deconditioning Impaired mobility PT, OT obtained.  SNF recommended  Goals of care   Code Status: DNR     DVT prophylaxis:  enoxaparin (LOVENOX) injection 40 mg Start: 06/24/22 1130   Antimicrobials: Zyvox Fluid: Not on IV hydration.  Getting free water through tube Consultants: None currently Family Communication: None at bedside  Status: Inpatient Level of care:  Telemetry Cardiac   Needs to continue in-hospital care:  Has core track feeding.  Mental status off and on  Patient from: Home Anticipated d/c to: Pending clinical course   Diet:  Diet Order             Diet regular Fluid consistency: Thin  Diet effective now                   Scheduled Meds:  arformoterol  15 mcg Nebulization BID   [START ON 07/07/2022] aspirin  81 mg Oral Daily   [START ON 07/07/2022] atorvastatin  80 mg Oral Daily   Chlorhexidine Gluconate Cloth  6 each Topical Daily   cloNIDine  0.2 mg Oral TID   enoxaparin (LOVENOX) injection  40 mg Subcutaneous Q24H   fiber  supplement (BANATROL TF)  60 mL Oral BID   folic acid  1 mg Per Tube Daily   free water  100 mL Per Tube Q4H   furosemide  40 mg Intravenous Daily   Gerhardt's butt cream   Topical BID   guaiFENesin  1,200 mg Oral BID   insulin aspart  0-9 Units Subcutaneous Q4H   insulin glargine-yfgn  8 Units Subcutaneous Daily   ipratropium-albuterol  3 mL Nebulization Q6H   lactulose  30 g Oral TID   [START ON 07/07/2022] lisinopril  20 mg Oral Daily   metoprolol tartrate  50 mg Oral BID   multivitamin with minerals  1 tablet Oral Daily   nicotine  21 mg Transdermal Daily   nutrition supplement (JUVEN)  1 packet Oral BID BM   mouth rinse  15 mL Mouth Rinse 4 times per day   QUEtiapine  12.5 mg Oral Q1400   QUEtiapine  25 mg Oral QHS   revefenacin  175 mcg Nebulization Daily   sodium chloride flush  3 mL Intravenous Q12H   [START ON 07/07/2022] thiamine  100 mg Oral Daily   [START ON 07/07/2022] vitamin B-12  500 mcg Oral Daily   white petrolatum   Topical BID    PRN meds: sodium chloride, albuterol, haloperidol lactate, hydrALAZINE, nitroGLYCERIN, mouth rinse, sodium chloride flush   Infusions:   sodium chloride Stopped (07/05/22 1053)   feeding supplement (VITAL AF 1.2 CAL) Stopped (07/06/22 0916)   linezolid (ZYVOX) IV 600 mg (07/06/22 1002)    Antimicrobials: Anti-infectives (From admission, onward)    Start     Dose/Rate Route Frequency Ordered Stop   07/01/22 1215  linezolid (ZYVOX) IVPB 600 mg        600 mg 300 mL/hr over 60 Minutes Intravenous Every 12 hours 07/01/22 1121 07/07/22 2359   06/28/22 1400  piperacillin-tazobactam (ZOSYN) IVPB 3.375 g        3.375 g 12.5 mL/hr over 240 Minutes Intravenous Every 8 hours 06/28/22 0941 07/03/22 0910   06/27/22 1100  cefTRIAXone (ROCEPHIN) 2 g in sodium chloride 0.9 % 100 mL IVPB  Status:  Discontinued        2 g 200 mL/hr over 30 Minutes Intravenous Every 24 hours 06/27/22 1012 06/28/22 0941   06/26/22 1200  ceFEPIme (MAXIPIME) 2 g in  sodium chloride 0.9 % 100 mL IVPB  Status:  Discontinued        2 g 200 mL/hr over 30 Minutes Intravenous Every 8 hours 06/26/22 1051 06/27/22 1012       Nutritional status:  Body mass index is 35.05 kg/m.  Nutrition Problem: Moderate Malnutrition Etiology: acute illness Signs/Symptoms: meal completion < 50%, energy intake < 75% for > 7 days, mild muscle depletion, moderate muscle depletion     Objective: Vitals:   07/06/22 0818 07/06/22 0913  BP:  (!) 170/72  Pulse:    Resp:    Temp:    SpO2: 98%     Intake/Output Summary (Last 24 hours) at 07/06/2022 1403 Last data filed at 07/06/2022 1206 Gross per 24 hour  Intake 2931.91 ml  Output 3650 ml  Net -718.09 ml    Filed Weights   07/04/22 0415 07/05/22 0200 07/06/22 0332  Weight: 106.4 kg 110.8 kg 110.8 kg   Weight change: 0 kg Body mass index is 35.05 kg/m.   Physical Exam: General exam: Elderly Caucasian male.  Not in distress Skin: No rashes, lesions or ulcers. HEENT: Atraumatic, normocephalic, no obvious bleeding.  Cortrak tube in place. Lungs: Clear to auscultation bilaterally CVS: Regular rate and rhythm, normal GI/Abd: Soft, nontender, distended.  Bowel sound present CNS: Alert, awake, limited communication Psychiatry: Frustrated Extremities: Pedal edema 1+ bilaterally, no calf tenderness  Data Review: I have personally reviewed the laboratory data and studies available.  F/u labs ordered Unresulted Labs (From admission, onward)     Start     Ordered   07/04/22 0500  CBC  Every 48 hours,   R     Question:  Specimen collection method  Answer:  Lab=Lab collect   07/02/22 1510  07/04/22 0500  Comprehensive metabolic panel  Every 48 hours,   R     Question:  Specimen collection method  Answer:  Lab=Lab collect   07/02/22 1510   07/04/22 0500  Magnesium  Every 48 hours,   R     Question:  Specimen collection method  Answer:  Lab=Lab collect   07/02/22 1510   07/04/22 0500  Phosphorus  Every 48 hours,    R     Question:  Specimen collection method  Answer:  Lab=Lab collect   07/02/22 1510            Total time spent in review of labs and imaging, patient evaluation, formulation of plan, documentation and communication with family: 45 minutes  Signed, Lorin Glass, MD [facility] 07/06/2022

## 2022-07-06 NOTE — Progress Notes (Signed)
Speech Language Pathology Treatment: Dysphagia  Patient Details Name: [name] Oneal MRN: [id number] DOB: 1945/12/23 Today's Date: 07/06/2022 Time: 6237-6283 SLP Time Calculation (min) (ACUTE ONLY): 10 min  Assessment / Plan / Recommendation Clinical Impression  Pt did not want to eat graham cracker initially however with prompts was agreeable and needed cues to stay on task. He is edentulous and does not use dentures to eat and demonstrated mildly prolonged mastication with regular texture without oral residue. There were no s/s aspiration with consecutive sips thin via straw throughout session. Pt's breakfast came at end of session and therapist wanted to observe pt with sausage however he politely declined. Recommend continue regular texture, thin liquid, pills with water and attempt observe pt with meal as able to ensure he is not pocketing and regular texture is efficient.    HPI HPI: [name] Oneal is a 77 year old male, daily smoker with CAD who was admitted 3/23 for chest pain noted to have an NSTEMI. Pt admitted to ICU. Course complicated by delirium, withdrawal, code stroke called and cancelled 4/2. Transferred to hospitalist service 4/7. Pt with cortrak in place.  Head CT with no acute findings.  CXR 4/6: "1. Lower lung volumes with increased generalized interstitial opacity. Cannot exclude mild or developing interstitial edema.  2. Patchy left lung base opacity although ventilation there appears improved since 06/30/2022. Favor atelectasis over other considerations." Pt with PMH significant for chronic alcoholism, chronic smoking, CAD.      SLP Plan  Continue with current plan of care      Recommendations for follow up therapy are one component of a multi-disciplinary discharge planning process, led by the attending physician.  Recommendations may be updated based on patient status, additional functional criteria and insurance authorization.    Recommendations  Diet  recommendations: Regular;Thin liquid Liquids provided via: Straw Medication Administration: Whole meds with liquid Supervision: Staff to assist with self feeding;Full supervision/cueing for compensatory strategies Compensations: Slow rate;Small sips/bites;Minimize environmental distractions Postural Changes and/or Swallow Maneuvers: Seated upright 90 degrees                  Oral care BID   Frequent or constant Supervision/Assistance Dysphagia, oral phase (R13.11)     Continue with current plan of care     Royce Macadamia  07/06/2022, 10:24 AM

## 2022-07-07 DIAGNOSIS — I200 Unstable angina: Secondary | ICD-10-CM | POA: Diagnosis not present

## 2022-07-07 LAB — GLUCOSE, CAPILLARY
Glucose-Capillary: 149 mg/dL — ABNORMAL HIGH (ref 70–99)
Glucose-Capillary: 151 mg/dL — ABNORMAL HIGH (ref 70–99)
Glucose-Capillary: 121 mg/dL — ABNORMAL HIGH (ref 70–99)
Glucose-Capillary: 114 mg/dL — ABNORMAL HIGH (ref 70–99)
Glucose-Capillary: 129 mg/dL — ABNORMAL HIGH (ref 70–99)

## 2022-07-07 MED ORDER — QUETIAPINE FUMARATE 25 MG PO TABS
25.0000 mg | ORAL_TABLET | Freq: Every day | ORAL | Status: DC
  Administered 2022-07-07: 25 mg via ORAL
  Filled 2022-07-07: qty 1

## 2022-07-07 MED ORDER — CLONIDINE HCL 0.1 MG PO TABS
0.1000 mg | ORAL_TABLET | Freq: Every day | ORAL | Status: AC
  Administered 2022-07-12 – 2022-07-13 (×2): 0.1 mg via ORAL
  Filled 2022-07-07 (×2): qty 1

## 2022-07-07 MED ORDER — CLONIDINE HCL 0.1 MG PO TABS
0.1000 mg | ORAL_TABLET | Freq: Two times a day (BID) | ORAL | Status: AC
  Administered 2022-07-10 – 2022-07-11 (×4): 0.1 mg via ORAL
  Filled 2022-07-07 (×4): qty 1

## 2022-07-07 MED ORDER — CLONIDINE HCL 0.1 MG PO TABS
0.1000 mg | ORAL_TABLET | Freq: Three times a day (TID) | ORAL | Status: AC
  Administered 2022-07-08 – 2022-07-09 (×6): 0.1 mg via ORAL
  Filled 2022-07-07 (×6): qty 1

## 2022-07-07 NOTE — TOC Progression Note (Addendum)
Transition of Care [facility]) - Progression Note    Patient Details  Name: [name] Oneal MRN: [id number] Date of Birth: 12-15-1945  [facility] CM/SW Contact  Delilah Shan, LCSWA Phone Number: 07/07/2022, 11:20 AM  Clinical Narrative:     Patient currently has cortrak. Following to refax patient out for SNF placement closer to patient being medically ready. CSW will continue to follow and assist with patients dc planning needs.  CSW spoke with patients daughter who gave CSW permission to check with [facility] and [facility] to see if patients insurance is active to determine which insurance patient will use for short term rehab. CSW spoke with April with [place]. April informed CSW that patient is not service connected and is showing no insurance through the [place]. April informed CSW that patient has no pcp and his unassigned Child psychotherapist is Gaffer available Sun-Wed. 4:00am-2:30pm tele# [phone] Ex: Milana Obey Sun-Wed 1:30pm-12:00am tele# [phone] Ex:[id number],Adaku Okidedi Wed-Sun 4:00am-2:30pm,Ashley Darin Engels Wed-S 1:30pm 12:00am EX: [id number]. CSW informed CM. CSW called Tammi with [facility] who informed CSW that patient currently is not active with [facility]. CSW informed patients daughter. Patients daughter informed CSW is going to find patients insurance plan to confirm what insurance patient currently has. CSW awaiting call back. CSW will continue to follow.  Update-3:34pm- CSW received call back from patients daughter who provided CSW with patients [name] member ID# [id number]. Patients daughter informed CSW she is going to bring a copy of the card so that we will have it on file. Patients daughter confirmed patient will use his [employer] medicare benefits for SNF placement. CSW following to refax out patient.   Expected Discharge Plan: Skilled Nursing Facility Barriers to Discharge: Continued Medical Work up  Expected Discharge Plan and Services In-house Referral: Clinical  Social Work Discharge Planning Services: CM Consult   Living arrangements for the past 2 months: Single Family Home                                       Social Determinants of Health (SDOH) Interventions SDOH Screenings   Food Insecurity: No Food Insecurity (06/20/2022)  Housing: Low Risk  (06/20/2022)  Transportation Needs: No Transportation Needs (06/20/2022)  Utilities: Not At Risk (06/20/2022)  Tobacco Use: High Risk (06/20/2022)    Readmission Risk Interventions     No data to display

## 2022-07-07 NOTE — Consult Note (Addendum)
WOC Nurse wound follow up Wound type: Pressure  Measurement: 1.  Bridge of nose with Unstageable Pressure Injury 2 cms x 1.4 cms 100% dry eschar  2.  L heel Deep Tissue Pressure Injury 4 cms x 5 cms purple maroon discoloration  3.  R heel Deep Tissue Injury that has now evolved to Unstageable 4 cms x 6 cms dry eschar  4.  Right posterior thigh Deep Tissue Pressure Injury that is now a stage 2 linear; 5 cms x 1 cm 100% pink and moist  5. Left lateral leg 6 cms x 2.5 cm appears to be Deep Tissue Pressure Injury, 100% black devitalized tissue with erythema surrounding   Drainage (amount, consistency, odor) minimal serosanguinous noted R thigh  Dressing procedure/placement/frequency:  Continue with vaseline to bridge of nose twice daily and leave open to air.  L heel and R posterior thigh continue with silicone foam dressings.  R heel paint with Betadine twice daily and cover with Silicone foam dressing. May lift silicone foam to reapply Betadine.  Change foam dressing q3 days and prn soiling.  L lateral leg clean with NS, apply a single layer of Xeroform gauze [place] [phone]) to wound bed daily, cover with silicone foam. May lift foam to reapply Xeroform, change foam dressing q3 days and prn soiling.   POC discussed with bedside nurse. Did send secure chat to MD regarding left lateral leg wound. Patient appears to be confused, yelling and cursing at staff and non-compliant with care.    Patient is wearing Prevalon boots at this visit to help offload pressure to heels.  Patient should remain on low air loss mattress throughout hospitalization for moisture management and pressure redistribution.   WOC team will continue to reassess wounds weekly.    Thank you,    Priscella Mann MSN, RN-BC, [employer] [phone]

## 2022-07-07 NOTE — Progress Notes (Signed)
PROGRESS NOTE  Vencent Hauschild  DOB: [birth year]  PCP: Herby Abraham, MD ZOX:[id number]  DOA: 06/20/2022  LOS: 17 days  Hospital Day: 18  Brief narrative: [name] Oneal is a 77 y.o. male with PMH significant for chronic alcoholism, chronic smoking, CAD 3/23 Saturday, patient presented to the ED with complaint of chest pain.  He was noted to have NSTEMI and was admitted under cardiology service with a tentative plan to cath on Monday 3/25. 3/24, PCCM was consulted for alcohol withdrawal.  He was also hypoxic, suspected aspiration pneumonia.  Patient was transferred to ICU, subsequently started on Precedex drip, phenobarb.  Core track feeding was started. For the next several days in ICU, patient remains encephalopathic.  His mental status improved but kept waxing and waning. CT head was unremarkable.  EEG did not show any evidence of seizures. Gradually improved mental status 4/7, transferred out to [facility]  Subjective: Patient was seen and examined this morning.   Lying in bed.  Alert, awake, answers some questions appropriately.  Speech not clear yet.. Oral intake is there but not reliable and hence he is on oxygen tube feeding as well.  Assessment and plan: Acute toxic metabolic encephalopathy Acute EtOH withdrawal with delirium tremens Initially transferred to ICU for alcohol withdrawal symptoms.  Withdrawal symptoms resolved but now has ICU delirium.  While in ICU, patient required Precedex drip and phenobarbital. Currently on Seroquel twice daily, clonidine, valproic acid, PRN Haldol Needs frequent reorientation.  Mental status gradually improving. Discussed with pharmacy.  Plan to minimize use of sedatives.  Stroke ruled out While in ICU, he was also suspected to have right hemineglect and hence stroke workup was done.  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  Dysphagia Moderate protein calorie malnutrition Seen by SLP and nutritionist Currently alert for regular diet but oral intake is not reliable and hence he is on nocturnal tube feeding as well through core track Nutritionist following.  Chronic alcoholism Elevated liver enzymes Elevated ammonia 4/7, right upper quadrant ultrasound with no evidence of liver cirrhosis. Continue monitor LFTs Ammonia elevation could also be partly due to valproic acid.  Currently on lactulose. Recent Labs  Lab 06/30/22 1704 07/01/22 0327 07/02/22 0149 07/04/22 0702 07/06/22 0530  AST  --   --   --  110* 110*  ALT  --   --   --  100* 113*  ALKPHOS  --   --   --  92 100  BILITOT  --   --   --  0.4 0.6  PROT  --   --   --  6.5 6.6  ALBUMIN  --   --   --  2.1* 2.0*  AMMONIA 37*  --   --   --  39*  PLT  --  282 285 291 367    Acute hypoxemic respiratory failure Aspiration pneumonia, resolved COPD exacerbation Chronic smoker Concern for OSA completed course of zosyn x 7 days Continue bronchodilators, pulmonary toileting Encouraged tobacco cessation Currently on 2 L oxygen nasal cannula.  Wean down as tolerated. May need outpatient sleep eval  NSTEMI History of CAD Initially admitted under cardiology service for NSTEMI.  Could not get cardiac cath as planned. Currently on aspirin Cardiology to follow Continue telemetry  Chronic diastolic CHF  Essential hypertension Echo 3/27 with EF 50 to 55%, grade 2 diastolic dysfunction, moderately elevated pulmonary artery systolic pressure Currently on  Lopressor 50 mg twice daily, clonidine 0.2 mg 3 times daily, lisinopril 20 mg daily, hydralazine as needed  Blood pressure remains elevated to 170s.   Currently on Lasix IV 40 mg daily.  2 L of negative balance in last 24 hours.  AKI Resolved. Recent Labs    06/25/22 0541 06/26/22 0655 06/27/22 0141 06/28/22 0229 06/29/22 0556 06/30/22 0110 07/01/22 0327 07/02/22 0149  07/04/22 0702 07/06/22 0530  BUN 27* 36* 40* 44* 54* 59* 60* 51* 37* 42*  CREATININE 1.00 1.10 1.09 1.02 1.20 1.28* 1.34* 1.30* 0.98 0.99    Prediabetes A1c 5.9 on 3/27 Currently in sliding scale insulin while on tube feeding. Recent Labs  Lab 07/06/22 1951 07/06/22 2343 07/07/22 0434 07/07/22 0748 07/07/22 1147  GLUCAP 131* 180* 149* 151* 121*     Macrocytic anemia Vitamin B12, folate deficiency Hemoglobin stable. Continue vitamin B12 and folate supplement Recent Labs    06/20/22 0545 06/21/22 0239 06/30/22 1703 07/01/22 0327 07/02/22 0149 07/04/22 0702 07/06/22 0530  HGB  --    < > 13.6 13.0 12.7* 11.8* 11.3*  MCV  --    < >  --  110.6* 108.7* 107.0* 108.1*  VITAMINB12 130*  --   --   --   --   --   --   FOLATE 3.1*  --   --   --   --   --   --    < > = values in this interval not displayed.    Acute urinary retention 4/8, more than 600 mL urine retention noted.  Foley catheter inserted.  LLE cellulitis Clinically improving. currently on 7 day course of linezolid to complete 10/9  Severe deconditioning Impaired mobility PT, OT obtained.  SNF recommended  Pressure ulcers Left heel, right heel, right posterior thigh Wound care consult appreciated. Continue low air mattress, Prevalon boots to offload pressure to the heels  Goals of care   Code Status: DNR     DVT prophylaxis:  enoxaparin (LOVENOX) injection 40 mg Start: 06/24/22 1130   Antimicrobials: Zyvox Fluid: Not on IV hydration.   Consultants: None currently Family Communication: None at bedside  Status: Inpatient Level of care:  Telemetry Cardiac   Needs to continue in-hospital care:  Has core track feeding.  Mental status off and on  Patient from: Home Anticipated d/c to: Pending clinical course   Diet:  Diet Order             Diet regular Fluid consistency: Thin  Diet effective now                   Scheduled Meds:  arformoterol  15 mcg Nebulization BID   aspirin   81 mg Oral Daily   atorvastatin  80 mg Oral Daily   Chlorhexidine Gluconate Cloth  6 each Topical Daily   [START ON 07/08/2022] cloNIDine  0.1 mg Oral TID   Followed by   Melene Muller ON 07/10/2022] cloNIDine  0.1 mg Oral BID   Followed by   Melene Muller ON 07/12/2022] cloNIDine  0.1 mg Oral Daily   cloNIDine  0.2 mg Oral TID   enoxaparin (LOVENOX) injection  40 mg Subcutaneous Q24H   fiber supplement (BANATROL TF)  60 mL Oral BID   folic acid  1 mg Per Tube Daily   free water  100 mL Per Tube Q4H   furosemide  40 mg Intravenous Daily   Gerhardt's butt cream   Topical BID   guaiFENesin  1,200 mg Oral  BID   insulin aspart  0-9 Units Subcutaneous Q4H   insulin glargine-yfgn  8 Units Subcutaneous Daily   ipratropium-albuterol  3 mL Nebulization Q6H   lactulose  30 g Oral TID   lisinopril  20 mg Oral Daily   metoprolol tartrate  50 mg Oral BID   multivitamin with minerals  1 tablet Oral Daily   nicotine  21 mg Transdermal Daily   nutrition supplement (JUVEN)  1 packet Oral BID BM   mouth rinse  15 mL Mouth Rinse 4 times per day   QUEtiapine  25 mg Oral QHS   revefenacin  175 mcg Nebulization Daily   sodium chloride flush  3 mL Intravenous Q12H   thiamine  100 mg Oral Daily   vitamin B-12  500 mcg Oral Daily   white petrolatum   Topical BID    PRN meds: sodium chloride, albuterol, haloperidol lactate, hydrALAZINE, nitroGLYCERIN, mouth rinse, sodium chloride flush   Infusions:   sodium chloride Stopped (07/05/22 1053)   feeding supplement (VITAL AF 1.2 CAL) 1,000 mL (07/06/22 1640)   linezolid (ZYVOX) IV 600 mg (07/07/22 0830)    Antimicrobials: Anti-infectives (From admission, onward)    Start     Dose/Rate Route Frequency Ordered Stop   07/01/22 1215  linezolid (ZYVOX) IVPB 600 mg        600 mg 300 mL/hr over 60 Minutes Intravenous Every 12 hours 07/01/22 1121 07/07/22 2359   06/28/22 1400  piperacillin-tazobactam (ZOSYN) IVPB 3.375 g        3.375 g 12.5 mL/hr over 240 Minutes  Intravenous Every 8 hours 06/28/22 0941 07/03/22 0910   06/27/22 1100  cefTRIAXone (ROCEPHIN) 2 g in sodium chloride 0.9 % 100 mL IVPB  Status:  Discontinued        2 g 200 mL/hr over 30 Minutes Intravenous Every 24 hours 06/27/22 1012 06/28/22 0941   06/26/22 1200  ceFEPIme (MAXIPIME) 2 g in sodium chloride 0.9 % 100 mL IVPB  Status:  Discontinued        2 g 200 mL/hr over 30 Minutes Intravenous Every 8 hours 06/26/22 1051 06/27/22 1012       Nutritional status:  Body mass index is 34.99 kg/m.  Nutrition Problem: Moderate Malnutrition Etiology: acute illness Signs/Symptoms: meal completion < 50%, energy intake < 75% for > 7 days, mild muscle depletion, moderate muscle depletion     Objective: Vitals:   07/07/22 0819 07/07/22 1146  BP: (!) 153/64 (!) 140/64  Pulse: 77 (!) 59  Resp:  (!) 21  Temp:  97.8 F (36.6 C)  SpO2:      Intake/Output Summary (Last 24 hours) at 07/07/2022 1328 Last data filed at 07/07/2022 1144 Gross per 24 hour  Intake 540 ml  Output 2575 ml  Net -2035 ml    Filed Weights   07/05/22 0200 07/06/22 0332 07/07/22 0437  Weight: 110.8 kg 110.8 kg 110.6 kg   Weight change: -0.2 kg Body mass index is 34.99 kg/m.   Physical Exam: General exam: Elderly Caucasian male.  Not in distress Skin: No rashes, lesions or ulcers. HEENT: Atraumatic, normocephalic, no obvious bleeding.  Cortrak tube in place. Lungs: Clear to auscultation bilaterally CVS: Regular rate and rhythm, normal GI/Abd: Soft, nontender, distended.  Bowel sound present CNS: Alert, awake, dysarthric but able to have a limited conversation Psychiatry: Frustrated Extremities: Pedal edema 1+ bilaterally, gradually improving, no calf tenderness  Data Review: I have personally reviewed the laboratory data and studies available.  F/u labs  ordered Unresulted Labs (From admission, onward)     Start     Ordered   07/04/22 0500  CBC  Every 48 hours,   R     Question:  Specimen collection  method  Answer:  Lab=Lab collect   07/02/22 1510   07/04/22 0500  Comprehensive metabolic panel  Every 48 hours,   R     Question:  Specimen collection method  Answer:  Lab=Lab collect   07/02/22 1510   07/04/22 0500  Magnesium  Every 48 hours,   R     Question:  Specimen collection method  Answer:  Lab=Lab collect   07/02/22 1510   07/04/22 0500  Phosphorus  Every 48 hours,   R     Question:  Specimen collection method  Answer:  Lab=Lab collect   07/02/22 1510            Total time spent in review of labs and imaging, patient evaluation, formulation of plan, documentation and communication with family: 45 minutes  Signed, Lorin Glass, MD [facility] 07/07/2022

## 2022-07-08 DIAGNOSIS — I200 Unstable angina: Secondary | ICD-10-CM | POA: Diagnosis not present

## 2022-07-08 LAB — COMPREHENSIVE METABOLIC PANEL
ALT: 105 U/L — ABNORMAL HIGH (ref 0–44)
AST: 111 U/L — ABNORMAL HIGH (ref 15–41)
Albumin: 2 g/dL — ABNORMAL LOW (ref 3.5–5.0)
Alkaline Phosphatase: 87 U/L (ref 38–126)
Anion gap: 8 (ref 5–15)
BUN: 33 mg/dL — ABNORMAL HIGH (ref 8–23)
CO2: 27 mmol/L (ref 22–32)
Calcium: 8.4 mg/dL — ABNORMAL LOW (ref 8.9–10.3)
Chloride: 101 mmol/L (ref 98–111)
Creatinine, Ser: 0.9 mg/dL (ref 0.61–1.24)
GFR, Estimated: >60 mL/min (ref >60)
Glucose, Bld: 106 mg/dL — ABNORMAL HIGH (ref 70–99)
Potassium: 4.2 mmol/L (ref 3.5–5.1)
Sodium: 136 mmol/L (ref 135–145)
Total Bilirubin: 0.7 mg/dL (ref 0.3–1.2)
Total Protein: 6.5 g/dL (ref 6.5–8.1)

## 2022-07-08 LAB — CBC
HCT: 34.3 % — ABNORMAL LOW (ref 39.0–52.0)
Hemoglobin: 11.3 g/dL — ABNORMAL LOW (ref 13.0–17.0)
MCH: 34.6 pg — ABNORMAL HIGH (ref 26.0–34.0)
MCHC: 32.9 g/dL (ref 30.0–36.0)
MCV: 104.9 fL — ABNORMAL HIGH (ref 80.0–100.0)
Platelets: 416 10*3/uL — ABNORMAL HIGH (ref 150–400)
RBC: 3.27 MIL/uL — ABNORMAL LOW (ref 4.22–5.81)
RDW: 14.8 % (ref 11.5–15.5)
WBC: 15.9 10*3/uL — ABNORMAL HIGH (ref 4.0–10.5)
nRBC: 0 % (ref 0.0–0.2)

## 2022-07-08 LAB — PHOSPHORUS: Phosphorus: 3.8 mg/dL (ref 2.5–4.6)

## 2022-07-08 LAB — GLUCOSE, CAPILLARY
Glucose-Capillary: 102 mg/dL — ABNORMAL HIGH (ref 70–99)
Glucose-Capillary: 123 mg/dL — ABNORMAL HIGH (ref 70–99)
Glucose-Capillary: 93 mg/dL (ref 70–99)
Glucose-Capillary: 94 mg/dL (ref 70–99)
Glucose-Capillary: 95 mg/dL (ref 70–99)
Glucose-Capillary: 99 mg/dL (ref 70–99)

## 2022-07-08 LAB — MAGNESIUM: Magnesium: 2.2 mg/dL (ref 1.7–2.4)

## 2022-07-08 MED ORDER — QUETIAPINE FUMARATE 50 MG PO TABS: 50.0000 mg | ORAL_TABLET | Freq: Every day | ORAL | Status: DC

## 2022-07-08 MED ORDER — FUROSEMIDE 40 MG PO TABS
40.0000 mg | ORAL_TABLET | Freq: Two times a day (BID) | ORAL | Status: DC
  Administered 2022-07-08 – 2022-07-14 (×12): 40 mg via ORAL
  Filled 2022-07-08 (×12): qty 1

## 2022-07-08 MED ORDER — QUETIAPINE FUMARATE 50 MG PO TABS
50.0000 mg | ORAL_TABLET | Freq: Every day | ORAL | Status: AC
  Administered 2022-07-08 – 2022-07-12 (×5): 50 mg via ORAL
  Filled 2022-07-08 (×5): qty 1

## 2022-07-08 NOTE — Progress Notes (Signed)
Speech Language Pathology Treatment: Dysphagia  Patient Details Name: [name] Oneal MRN: [id number] DOB: [birth year] Today's Date: 07/08/2022 Time: 8110-3159 SLP Time Calculation (min) (ACUTE ONLY): 14 min  Assessment / Plan / Recommendation Clinical Impression  Pt seen for ongoing dysphagia management.  Pt remains somewhat confused/inappropriate.  RN reports poor PO intake, but did consume around 50% of AM meal as compared with 10% of trays yesterday.  Pt drank over 8 oz of thin liquid by straw sip with no clinical s/s of aspiration.  Pt exhibited good oral clearance of puree and no clinical s/s of aspiration.  Pt was very reluctant to accept solid trial.  Pt was offered several alternatives, including personal snacks in room.  SLP eventually was able to place a bite of cracker in oral cavity and pt achieved full oral clearance without any residue without use of liquid wash.  SLP will continue to follow given poor acceptance of trials today and general decreased PO intake.  Hopefully as mentation improves, so will pt's PO intake.  Recommend continuing regular texture solids with thin liquids.    HPI HPI: [name] Oneal is a 77 year old male, daily smoker with CAD who was admitted 3/23 for chest pain noted to have an NSTEMI. Pt admitted to ICU. Course complicated by delirium, withdrawal, code stroke called and cancelled 4/2. Transferred to hospitalist service 4/7. Pt with cortrak in place.  Head CT with no acute findings.  CXR 4/6: "1. Lower lung volumes with increased generalized interstitial opacity. Cannot exclude mild or developing interstitial edema.  2. Patchy left lung base opacity although ventilation there appears improved since 06/30/2022. Favor atelectasis over other considerations." Pt with PMH significant for chronic alcoholism, chronic smoking, CAD.      SLP Plan  Continue with current plan of care      Recommendations for follow up therapy are one component of a  multi-disciplinary discharge planning process, led by the attending physician.  Recommendations may be updated based on patient status, additional functional criteria and insurance authorization.    Recommendations  Diet recommendations: Regular;Thin liquid Liquids provided via: Straw Medication Administration: Whole meds with liquid Supervision: Staff to assist with self feeding;Full supervision/cueing for compensatory strategies Compensations: Slow rate;Small sips/bites;Minimize environmental distractions Postural Changes and/or Swallow Maneuvers: Seated upright 90 degrees                  Oral care BID   Frequent or constant Supervision/Assistance Dysphagia, oral phase (R13.11)     Continue with current plan of care     Kerrie Pleasure, MA, CCC-SLP Acute Rehabilitation Services Office: [phone] 07/08/2022, 10:30 AM

## 2022-07-08 NOTE — Progress Notes (Signed)
Occupational Therapy Treatment Patient Details Name: [name] Oneal MRN: [id number] DOB: 05-09-1945 Today's Date: 07/08/2022   History of present illness 77 y/o gentleman presented with CP and was found to have an NSTEMI.  Hospital course has been complicated by severe alcohol withdrawal and delirium tremens. PMH: CAD, tobacco and ETOH abuse   OT comments  Pt agitated during session and refused all mobility but was receptive to bed level exercises. Pt remains limited by pain, stating BLE pain is a 20 on the 10pt scale, unable to truly assess. Pt completed exercises (below) and was educated on the benefits of AROM to prevent clotting. Pt's cognition, level of agitation, and pain remain limiting factor as patient was barely performing AROM exercises but demo'd ability to lift legs high enough to place pillows underneath.  OT to continue to progress Pt as able, DC plans remain appropriate at this time.   Recommendations for follow up therapy are one component of a multi-disciplinary discharge planning process, led by the attending physician.  Recommendations may be updated based on patient status, additional functional criteria and insurance authorization.    Assistance Recommended at Discharge Frequent or constant Supervision/Assistance  Patient can return home with the following   (NA for DC home)   Equipment Recommendations  None recommended by OT    Recommendations for Other Services      Precautions / Restrictions Precautions Precautions: Fall Precaution Comments: confusion Restrictions Weight Bearing Restrictions: No       Mobility Bed Mobility               General bed mobility comments: Pt declined any OOB and bed mobility    Transfers                         Balance                                           ADL either performed or assessed with clinical judgement   ADL       Grooming: Wash/dry face;Bed  level;Supervision/safety;Set up                                 General ADL Comments: No OOB mobility, Pt refused    Extremity/Trunk Assessment              Vision       Perception     Praxis      Cognition Arousal/Alertness: Awake/alert Behavior During Therapy: Restless, Agitated Overall Cognitive Status: Impaired/Different from baseline Area of Impairment: Attention, Following commands, Problem solving, Awareness, Safety/judgement                   Current Attention Level: Focused   Following Commands: Follows one step commands with increased time Safety/Judgement: Decreased awareness of safety, Decreased awareness of deficits Awareness: Intellectual Problem Solving: Slow processing, Decreased initiation, Difficulty sequencing, Requires verbal cues, Requires tactile cues General Comments: Pt agitated and refused therapy despite consistent encouragement, refused any OOB mobility but was receptive to bed level exercies. Pt continued to make consistent remarks about jumping out the window during therapy session.        Exercises Other Exercises Other Exercises: AROM dorsiflexion/plantar flexion BLEs x10 reps Other Exercises: Quad squeezes x8 reps BLEs Other Exercises: Leg lifts x5  reps with 3sec holds BLEs    Shoulder Instructions       General Comments Pt continues with pressure wounds. No noted orders to keep weight off feet in notes nor in chart.    Pertinent Vitals/ Pain       Pain Assessment Pain Score: 10-Worst pain ever Pain Location: BLE/feet Pain Descriptors / Indicators: Aching, Grimacing, Discomfort Pain Intervention(s): Limited activity within patient's tolerance, Monitored during session  Home Living                                          Prior Functioning/Environment              Frequency  Min 2X/week        Progress Toward Goals  OT Goals(current goals can now be found in the care plan  section)  Progress towards OT goals: Progressing toward goals  Acute Rehab OT Goals Patient Stated Goal: to stay in the bed OT Goal Formulation: Patient unable to participate in goal setting Time For Goal Achievement: 07/17/22 Potential to Achieve Goals: Good  Plan Discharge plan remains appropriate;Frequency remains appropriate    Co-evaluation                 AM-PAC OT "6 Clicks" Daily Activity     Outcome Measure   Help from another person eating meals?: Total Help from another person taking care of personal grooming?: A Lot Help from another person toileting, which includes using toliet, bedpan, or urinal?: A Lot Help from another person bathing (including washing, rinsing, drying)?: Total Help from another person to put on and taking off regular upper body clothing?: Total Help from another person to put on and taking off regular lower body clothing?: Total 6 Click Score: 8    End of Session    OT Visit Diagnosis: Unsteadiness on feet (R26.81);Other abnormalities of gait and mobility (R26.89);Repeated falls (R29.6);Muscle weakness (generalized) (M62.81);Other symptoms and signs involving cognitive function;Pain Pain - part of body: Leg;Ankle and joints of foot (Bilat)   Activity Tolerance Treatment limited secondary to agitation   Patient Left in bed;with call bell/phone within reach;with bed alarm set   Nurse Communication Mobility status        Time: 1600-1610 OT Time Calculation (min): 10 min  Charges: OT General Charges $OT Visit: 1 Visit OT Treatments $Therapeutic Exercise: 8-22 mins  07/08/2022  AB, OTR/L  Acute Rehabilitation Services  Office: [phone]   Tristan Schroeder 07/08/2022, 4:28 PM

## 2022-07-08 NOTE — Progress Notes (Signed)
PROGRESS NOTE  [name] Oneal  DOB: 1946/01/07  PCP: Herby Abraham, MD SRP:[id number]  DOA: 06/20/2022  LOS: 18 days  Hospital Day: 19  Brief narrative: [name] Oneal is a 77 y.o. male with PMH significant for chronic alcoholism, chronic smoking, CAD 3/23 Saturday, patient presented to the ED with complaint of chest pain.  He was noted to have NSTEMI and was admitted under cardiology service with a tentative plan to cath on Monday 3/25. 3/24, PCCM was consulted for alcohol withdrawal.  He was also hypoxic, suspected aspiration pneumonia.  Patient was transferred to ICU, subsequently started on Precedex drip, phenobarb.  Core track feeding was started. For the next several days in ICU, patient remains encephalopathic.  His mental status improved but kept waxing and waning. CT head was unremarkable.  EEG did not show any evidence of seizures. Gradually improved mental status 4/7, transferred out to [facility]  Subjective: Patient was seen and examined this afternoon. RN at bedside. Patient was altered last night and pulled out IV line. This morning and afternoon.  Ate good portion of his breakfast and lunch today. Continues to have diarrhea.  Has Flexi-Seal in place.  Assessment and plan: Acute toxic metabolic encephalopathy Acute EtOH withdrawal with delirium tremens Initially transferred to ICU for alcohol withdrawal symptoms.  Withdrawal symptoms resolved but now has ICU delirium.  While in ICU, patient required Precedex drip and phenobarbital. Currently on Seroquel 25 mg nightly.  Valproic acid have been stopped.  Clonidine in a tapering course.   I will increase Seroquel to 50 mg for tonight Needs frequent reorientation.  Mental status gradually improving. Discussed with pharmacy.  Plan to minimize use of sedatives.  Stroke ruled out While in ICU, he was also suspected to have right hemineglect and hence stroke workup was done.  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  Dysphagia Moderate protein calorie malnutrition Seen by SLP and nutritionist Currently on regular diet.  Oral appetite improving. Also on nocturnal tube feeding.  Given his persistent diarrhea, I would hold notion nocturnal feeding for tonight.    Chronic alcoholism Elevated liver enzymes Elevated ammonia 4/7, right upper quadrant ultrasound with no evidence of liver cirrhosis. Continue monitor LFTs Ammonia elevation could also be partly due to valproic acid.  Currently on lactulose.  Because of ongoing diarrhea, stop lactulose Recent Labs  Lab 07/02/22 0149 07/04/22 0702 07/06/22 0530 07/08/22 0617  AST  --  110* 110* 111*  ALT  --  100* 113* 105*  ALKPHOS  --  92 100 87  BILITOT  --  0.4 0.6 0.7  PROT  --  6.5 6.6 6.5  ALBUMIN  --  2.1* 2.0* 2.0*  AMMONIA  --   --  39*  --   PLT 285 291 367 416*   Acute hypoxemic respiratory failure Aspiration pneumonia, resolved COPD exacerbation Chronic smoker Concern for OSA completed course of zosyn x 7 days Continue bronchodilators, pulmonary toileting Encouraged tobacco cessation Currently on 2 L oxygen nasal cannula.  Wean down as tolerated. May need outpatient sleep eval  NSTEMI History of CAD Initially admitted under cardiology service for NSTEMI.  Could not get cardiac cath as planned. Currently on aspirin Cardiology to follow Continue telemetry  Chronic diastolic CHF  Essential hypertension Echo 3/27 with EF 50 to 55%, grade 2 diastolic dysfunction, moderately elevated pulmonary artery systolic pressure Currently on Lopressor 50 mg twice daily, clonidine 0.2  mg 3 times daily, lisinopril 20 mg daily, hydralazine as needed  Blood pressure remains elevated to 170s.   Currently on Lasix IV 40 mg daily.  2 L of negative balance in last 24 hours. No IV access today.  Switch to oral Lasix 40 mg twice  daily.  AKI Resolved. Recent Labs    06/26/22 0655 06/27/22 0141 06/28/22 0229 06/29/22 0556 06/30/22 0110 07/01/22 0327 07/02/22 0149 07/04/22 0702 07/06/22 0530 07/08/22 0617  BUN 36* 40* 44* 54* 59* 60* 51* 37* 42* 33*  CREATININE 1.10 1.09 1.02 1.20 1.28* 1.34* 1.30* 0.98 0.99 0.90   Prediabetes A1c 5.9 on 3/27 Currently in sliding scale insulin while on tube feeding. Recent Labs  Lab 07/07/22 2037 07/08/22 0007 07/08/22 0425 07/08/22 0800 07/08/22 1204  GLUCAP 129* 99 102* 94 123*    Macrocytic anemia Vitamin B12, folate deficiency Hemoglobin stable. Continue vitamin B12 and folate supplement Recent Labs    06/20/22 0545 06/21/22 0239 07/01/22 0327 07/02/22 0149 07/04/22 0702 07/06/22 0530 07/08/22 0617  HGB  --    < > 13.0 12.7* 11.8* 11.3* 11.3*  MCV  --    < > 110.6* 108.7* 107.0* 108.1* 104.9*  VITAMINB12 130*  --   --   --   --   --   --   FOLATE 3.1*  --   --   --   --   --   --    < > = values in this interval not displayed.   Acute urinary retention 4/8, more than 600 mL urine retention noted.  Foley catheter inserted.  Plan to do voiding trial in 1 to 2 days  LLE cellulitis Clinically improving. currently on 7 day course of linezolid to complete 10/9  Severe deconditioning Impaired mobility PT, OT obtained.  SNF recommended  Pressure ulcers Left heel, right heel, right posterior thigh Wound care consult appreciated. Continue low air mattress, Prevalon boots to offload pressure to the heels  Goals of care   Code Status: DNR     DVT prophylaxis:  enoxaparin (LOVENOX) injection 40 mg Start: 06/24/22 1130   Antimicrobials: Zyvox Fluid: Not on IV hydration.   Consultants: None currently Family Communication: None at bedside  Status: Inpatient Level of care:  Telemetry Cardiac   Needs to continue in-hospital care:  Has core track feeding.  Mental status off and on  Patient from: Home Anticipated d/c to: Pending clinical  course   Diet:  Diet Order             Diet regular Fluid consistency: Thin  Diet effective now                   Scheduled Meds:  arformoterol  15 mcg Nebulization BID   aspirin  81 mg Oral Daily   atorvastatin  80 mg Oral Daily   Chlorhexidine Gluconate Cloth  6 each Topical Daily   cloNIDine  0.1 mg Oral TID   Followed by   Melene Muller ON 07/10/2022] cloNIDine  0.1 mg Oral BID   Followed by   Melene Muller ON 07/12/2022] cloNIDine  0.1 mg Oral Daily   enoxaparin (LOVENOX) injection  40 mg Subcutaneous Q24H   fiber supplement (BANATROL TF)  60 mL Oral BID   folic acid  1 mg Per Tube Daily   furosemide  40 mg Oral BID   Gerhardt's butt cream   Topical BID   guaiFENesin  1,200 mg Oral BID   insulin aspart  0-9 Units Subcutaneous  Q4H   insulin glargine-yfgn  8 Units Subcutaneous Daily   ipratropium-albuterol  3 mL Nebulization Q6H   lisinopril  20 mg Oral Daily   metoprolol tartrate  50 mg Oral BID   multivitamin with minerals  1 tablet Oral Daily   nicotine  21 mg Transdermal Daily   nutrition supplement (JUVEN)  1 packet Oral BID BM   mouth rinse  15 mL Mouth Rinse 4 times per day   QUEtiapine  50 mg Oral QHS   revefenacin  175 mcg Nebulization Daily   sodium chloride flush  3 mL Intravenous Q12H   thiamine  100 mg Oral Daily   vitamin B-12  500 mcg Oral Daily   white petrolatum   Topical BID    PRN meds: sodium chloride, albuterol, haloperidol lactate, hydrALAZINE, nitroGLYCERIN, mouth rinse, sodium chloride flush   Infusions:   sodium chloride Stopped (07/05/22 1053)    Antimicrobials: Anti-infectives (From admission, onward)    Start     Dose/Rate Route Frequency Ordered Stop   07/01/22 1215  linezolid (ZYVOX) IVPB 600 mg        600 mg 300 mL/hr over 60 Minutes Intravenous Every 12 hours 07/01/22 1121 07/07/22 2359   06/28/22 1400  piperacillin-tazobactam (ZOSYN) IVPB 3.375 g        3.375 g 12.5 mL/hr over 240 Minutes Intravenous Every 8 hours 06/28/22 0941  07/03/22 0910   06/27/22 1100  cefTRIAXone (ROCEPHIN) 2 g in sodium chloride 0.9 % 100 mL IVPB  Status:  Discontinued        2 g 200 mL/hr over 30 Minutes Intravenous Every 24 hours 06/27/22 1012 06/28/22 0941   06/26/22 1200  ceFEPIme (MAXIPIME) 2 g in sodium chloride 0.9 % 100 mL IVPB  Status:  Discontinued        2 g 200 mL/hr over 30 Minutes Intravenous Every 8 hours 06/26/22 1051 06/27/22 1012       Nutritional status:  Body mass index is 33.47 kg/m.  Nutrition Problem: Moderate Malnutrition Etiology: acute illness Signs/Symptoms: meal completion < 50%, energy intake < 75% for > 7 days, mild muscle depletion, moderate muscle depletion     Objective: Vitals:   07/08/22 0906 07/08/22 1233  BP: (!) 151/66 111/61  Pulse:  61  Resp:  19  Temp:  98.4 F (36.9 C)  SpO2:  92%    Intake/Output Summary (Last 24 hours) at 07/08/2022 1427 Last data filed at 07/08/2022 0900 Gross per 24 hour  Intake 240 ml  Output 2100 ml  Net -1860 ml   Filed Weights   07/06/22 0332 07/07/22 0437 07/08/22 0418  Weight: 110.8 kg 110.6 kg 105.8 kg   Weight change: -4.8 kg Body mass index is 33.47 kg/m.   Physical Exam: General exam: Elderly Caucasian male.  Not in distress.  Not in pain Skin: No rashes, lesions or ulcers. HEENT: Atraumatic, normocephalic, no obvious bleeding.  Cortrak tube in place. Lungs: Clear to auscultation bilaterally CVS: Regular rate and rhythm, normal GI/Abd: Soft, nontender, distended.  Bowel sound present CNS: Alert, awake, dysarthric but able to have a limited conversation.  Not restless or agitated at the time of my evaluation Psychiatry: Mood appropriate Extremities: Pedal edema gradually improving, no calf tenderness  Data Review: I have personally reviewed the laboratory data and studies available.  F/u labs ordered Unresulted Labs (From admission, onward)     Start     Ordered   07/04/22 0500  CBC  Every 48 hours,  R     Question:  Specimen  collection method  Answer:  Lab=Lab collect   07/02/22 1510   07/04/22 0500  Comprehensive metabolic panel  Every 48 hours,   R     Question:  Specimen collection method  Answer:  Lab=Lab collect   07/02/22 1510   07/04/22 0500  Magnesium  Every 48 hours,   R     Question:  Specimen collection method  Answer:  Lab=Lab collect   07/02/22 1510   07/04/22 0500  Phosphorus  Every 48 hours,   R     Question:  Specimen collection method  Answer:  Lab=Lab collect   07/02/22 1510            Total time spent in review of labs and imaging, patient evaluation, formulation of plan, documentation and communication with family: 45 minutes  Signed, Lorin GlassBinaya Aprel Egelhoff, MD [facility] 07/08/2022

## 2022-07-08 NOTE — TOC Progression Note (Signed)
Transition of Care [facility]) - Progression Note    Patient Details  Name: [name] Oneal MRN: [id number] Date of Birth: March 09, 1946  [facility] CM/SW Contact  Delilah Shan, LCSWA Phone Number: 07/08/2022, 4:09 PM  Clinical Narrative:     CSW spoke with patients daughter Melody and provided SNF bed offers. Melody patients daughter will review and give CSW call back with SNF choice. CSW following to start auth closer to patient being medically ready for dc. Melody confirmed she will bring in patients current insurance card. CSW will continue to follow.  Expected Discharge Plan: Skilled Nursing Facility Barriers to Discharge: Continued Medical Work up  Expected Discharge Plan and Services In-house Referral: Clinical Social Work Discharge Planning Services: CM Consult   Living arrangements for the past 2 months: Single Family Home                                       Social Determinants of Health (SDOH) Interventions SDOH Screenings   Food Insecurity: No Food Insecurity (06/20/2022)  Housing: Low Risk  (06/20/2022)  Transportation Needs: No Transportation Needs (06/20/2022)  Utilities: Not At Risk (06/20/2022)  Tobacco Use: High Risk (06/20/2022)    Readmission Risk Interventions     No data to display

## 2022-07-08 NOTE — Progress Notes (Signed)
Physical Therapy Treatment Patient Details Name: [name] Oneal MRN: [id number] DOB: [birth year] Today's Date: 07/08/2022   History of Present Illness 77 y/o gentleman presented with CP and was found to have an NSTEMI.  Hospital course has been complicated by severe alcohol withdrawal and delirium tremens. PMH: CAD, tobacco and ETOH abuse    PT Comments    Pt is slowly progressing towards goals. Pt continues to be limited by cognition. Pt was able to perform bed mobility with Mod A +1 and Min A +1 and standing EOB at Mod A +2. Due to current cognitive status pt continues to be a total A with 2 person assist but is increasing participation with encouragement. Currently recommending skilled physical therapy services at a higher level of care and frequency on discharge from acute care hospital setting in order to decrease risk for falls, injury and re-hospitalization. Pt refused O2 throughout session, no shortness of breathe and HR remained around 70 bpm.     Recommendations for follow up therapy are one component of a multi-disciplinary discharge planning process, led by the attending physician.  Recommendations may be updated based on patient status, additional functional criteria and insurance authorization.  Follow Up Recommendations  Can patient physically be transported by private vehicle: No    Assistance Recommended at Discharge Frequent or constant Supervision/Assistance  Patient can return home with the following Two people to help with walking and/or transfers;Assistance with cooking/housework;Assist for transportation;Help with stairs or ramp for entrance   Equipment Recommendations  Other (comment) (defer to post acute)    Recommendations for Other Services       Precautions / Restrictions Precautions Precautions: Fall Precaution Comments: confusion Restrictions Weight Bearing Restrictions: No     Mobility  Bed Mobility Overal bed mobility: Needs Assistance Bed  Mobility: Supine to Sit, Sit to Supine     Supine to sit: Mod assist, +2 for physical assistance, +2 for safety/equipment Sit to supine: Total assist   General bed mobility comments: Pt was Mod A at the trunk for supine>sitting and Min A from a second person he was able to move his bil LE to EOB and scoot to EOB at CGA to SBA. Pt was total assist for going sitting to supine due to pt stated he was not going to lie back down due to he needed to get out of here. Once back in supine pt stated " you are good girls." and was not upset. Most likely a fear of moving due to pain and falling as well as covering for congitive deficits for appropraitly sequencing movement. Patient Response: Anxious  Transfers Overall transfer level: Needs assistance Equipment used: Rolling walker (2 wheels) Transfers: Sit to/from Stand Sit to Stand: +2 physical assistance, From elevated surface, Mod assist           General transfer comment: Pt was Mod A +2 with rocking from elevated EOB. Attempt from EOB at lowest position pt was unable to success. Elevated bed and pt was Mod A +2 with verbal /tactile cues for correct hand placement. Pt was able to stand for ~2 min while sheets were removed from bed to assist with cleaning.    Ambulation/Gait     General Gait Details: Pt was unable to shift weight to clear either foot to attempt gait this session        Balance Overall balance assessment: History of Falls, Needs assistance Sitting-balance support: Feet supported, Bilateral upper extremity supported Sitting balance-Leahy Scale: Fair Sitting balance - Comments: Pt  was able to sit EOB when told where to place his hands. He became slightly unstable when picking up his feet for socks to be donned but managed to maintain balance without falling posteriorly.     Standing balance-Leahy Scale: Poor Standing balance comment: Pt able to stand ~2 min at Mod A +2 with Heavy reliance on RW stating that both of his  feet hurt.      Cognition Arousal/Alertness: Awake/alert Behavior During Therapy: Restless Overall Cognitive Status: Impaired/Different from baseline     Orientation Level: Disoriented to, Place, Situation   Memory: Decreased short-term memory Following Commands: Follows one step commands with increased time Safety/Judgement: Decreased awareness of safety, Decreased awareness of deficits   Problem Solving: Slow processing, Decreased initiation, Difficulty sequencing, Requires verbal cues, Requires tactile cues General Comments: Pt requires alot of consolation and encouragement to participate. Seems slightly paranoid stating that "they are trying to keep him here." " he only has these tubes and drains to keep him here." Unable to state who is they when asked.           General Comments General comments (skin integrity, edema, etc.): Pt continues with pressure wounds. No noted orders to keep weight off feet in notes nor in chart.      Pertinent Vitals/Pain Pain Assessment Pain Assessment: Faces Faces Pain Scale: Hurts a little bit Breathing: normal Negative Vocalization: none Facial Expression: smiling or inexpressive Body Language: relaxed Consolability: no need to console PAINAD Score: 0 Pain Location: BLE/feet Pain Descriptors / Indicators: Discomfort, Grimacing, Guarding Pain Intervention(s): Limited activity within patient's tolerance, Monitored during session     PT Goals (current goals can now be found in the care plan section) Acute Rehab PT Goals Patient Stated Goal: get out of the bed PT Goal Formulation: Patient unable to participate in goal setting Time For Goal Achievement: 07/17/22 Potential to Achieve Goals: Poor Progress towards PT goals: Progressing toward goals    Frequency    Min 1X/week      PT Plan Current plan remains appropriate       AM-PAC PT "6 Clicks" Mobility   Outcome Measure  Help needed turning from your back to your side  while in a flat bed without using bedrails?: A Lot Help needed moving from lying on your back to sitting on the side of a flat bed without using bedrails?: A Lot Help needed moving to and from a bed to a chair (including a wheelchair)?: Total Help needed standing up from a chair using your arms (e.g., wheelchair or bedside chair)?: Total Help needed to walk in hospital room?: Total Help needed climbing 3-5 steps with a railing? : Total 6 Click Score: 8    End of Session   Activity Tolerance: Patient limited by pain Patient left: in bed;with call bell/phone within reach;with bed alarm set   PT Visit Diagnosis: Unsteadiness on feet (R26.81);Other abnormalities of gait and mobility (R26.89);Repeated falls (R29.6);Muscle weakness (generalized) (M62.81);History of falling (Z91.81);Difficulty in walking, not elsewhere classified (R26.2);Other symptoms and signs involving the nervous system (R29.898);Adult, failure to thrive (R62.7);Pain Pain - Right/Left:  (bil) Pain - part of body: Knee;Leg;Ankle and joints of foot     Time: 6759-1638 PT Time Calculation (min) (ACUTE ONLY): 29 min  Charges:  $Therapeutic Activity: 23-37 mins                    Harrel Carina, DPT, CLT  Acute Rehabilitation Services Office: [phone] (Secure chat preferred)    Santina Evans  Pandora LeiterH Tashira Torre 07/08/2022, 4:08 PM

## 2022-07-08 NOTE — Progress Notes (Signed)
OK to not have an IV per Dr. Pola Corn

## 2022-07-08 NOTE — Progress Notes (Signed)
Alert and oriented to self and roughly to place. Speech continues to be unclear and seems he speaks off topic. Follows some commands. Curses frequently but mostly does so in jest or when he's in pain. Otherwise mood is more pleasant this shift and patient appreciative of the help he's received. During sleep patient intermittently fidgety and pulls leads and oxygen off frequently. Only PIV also out and patient refusing to receive a new one at the moment even after education on need. Patient made several comments about the state of his life and somewhat not caring as much about it. Encouraging patient and giving him goals such as eating more today. Had no intake overnight while tube feeds off.

## 2022-07-08 NOTE — Progress Notes (Signed)
RT NOTE: Pt is very agitated and refusing to wear his [place] at this time. RT at bedside attempted to educate pt on the importance of wearing [place], but pt keeps repeating the fact that we aren't going to put it on his face. RN informed.

## 2022-07-09 DIAGNOSIS — I200 Unstable angina: Secondary | ICD-10-CM | POA: Diagnosis not present

## 2022-07-09 LAB — GLUCOSE, CAPILLARY
Glucose-Capillary: 100 mg/dL — ABNORMAL HIGH (ref 70–99)
Glucose-Capillary: 122 mg/dL — ABNORMAL HIGH (ref 70–99)
Glucose-Capillary: 89 mg/dL (ref 70–99)
Glucose-Capillary: 93 mg/dL (ref 70–99)
Glucose-Capillary: 95 mg/dL (ref 70–99)
Glucose-Capillary: 99 mg/dL (ref 70–99)

## 2022-07-09 MED ORDER — GLUCERNA SHAKE PO LIQD
237.0000 mL | Freq: Three times a day (TID) | ORAL | Status: DC
  Administered 2022-07-09 – 2022-07-13 (×10): 237 mL via ORAL

## 2022-07-09 MED ORDER — IPRATROPIUM-ALBUTEROL 0.5-2.5 (3) MG/3ML IN SOLN
3.0000 mL | Freq: Four times a day (QID) | RESPIRATORY_TRACT | Status: DC
  Administered 2022-07-10: 3 mL via RESPIRATORY_TRACT
  Filled 2022-07-09: qty 3

## 2022-07-09 NOTE — Plan of Care (Signed)
Problem: Education: Goal: Knowledge of General Education information will improve Description: Including pain rating scale, medication(s)/side effects and non-pharmacologic comfort measures 07/09/2022 0736 by Herma Carson, RN Outcome: Progressing 07/09/2022 0733 by Herma Carson, RN Outcome: Progressing   Problem: Health Behavior/Discharge Planning: Goal: Ability to manage health-related needs will improve 07/09/2022 0736 by Herma Carson, RN Outcome: Progressing 07/09/2022 0733 by Herma Carson, RN Outcome: Progressing   Problem: Clinical Measurements: Goal: Ability to maintain clinical measurements within normal limits will improve 07/09/2022 0736 by Herma Carson, RN Outcome: Progressing 07/09/2022 0733 by Herma Carson, RN Outcome: Progressing Goal: Will remain free from infection 07/09/2022 0736 by Herma Carson, RN Outcome: Progressing 07/09/2022 0733 by Herma Carson, RN Outcome: Progressing Goal: Diagnostic test results will improve 07/09/2022 0736 by Herma Carson, RN Outcome: Progressing 07/09/2022 0733 by Herma Carson, RN Outcome: Progressing Goal: Respiratory complications will improve 07/09/2022 0736 by Herma Carson, RN Outcome: Progressing 07/09/2022 0733 by Herma Carson, RN Outcome: Progressing Goal: Cardiovascular complication will be avoided 07/09/2022 0736 by Herma Carson, RN Outcome: Progressing 07/09/2022 0733 by Herma Carson, RN Outcome: Progressing   Problem: Activity: Goal: Risk for activity intolerance will decrease 07/09/2022 0736 by Herma Carson, RN Outcome: Progressing 07/09/2022 0733 by Herma Carson, RN Outcome: Progressing   Problem: Nutrition: Goal: Adequate nutrition will be maintained 07/09/2022 0736 by Herma Carson, RN Outcome: Progressing 07/09/2022 0733 by Herma Carson, RN Outcome: Progressing   Problem: Coping: Goal: Level of anxiety will decrease 07/09/2022 0736 by Herma Carson, RN Outcome:  Progressing 07/09/2022 0733 by Herma Carson, RN Outcome: Progressing   Problem: Elimination: Goal: Will not experience complications related to bowel motility 07/09/2022 0736 by Herma Carson, RN Outcome: Progressing 07/09/2022 0733 by Herma Carson, RN Outcome: Progressing Goal: Will not experience complications related to urinary retention 07/09/2022 0736 by Herma Carson, RN Outcome: Progressing 07/09/2022 0733 by Herma Carson, RN Outcome: Progressing   Problem: Pain Managment: Goal: General experience of comfort will improve 07/09/2022 0736 by Herma Carson, RN Outcome: Progressing 07/09/2022 0733 by Herma Carson, RN Outcome: Progressing   Problem: Safety: Goal: Ability to remain free from injury will improve 07/09/2022 0736 by Herma Carson, RN Outcome: Progressing 07/09/2022 0733 by Herma Carson, RN Outcome: Progressing   Problem: Skin Integrity: Goal: Risk for impaired skin integrity will decrease 07/09/2022 0736 by Herma Carson, RN Outcome: Progressing 07/09/2022 0733 by Herma Carson, RN Outcome: Progressing   Problem: Education: Goal: Understanding of CV disease, CV risk reduction, and recovery process will improve 07/09/2022 0736 by Herma Carson, RN Outcome: Progressing 07/09/2022 0733 by Herma Carson, RN Outcome: Progressing Goal: Individualized Educational Video(s) 07/09/2022 0736 by Herma Carson, RN Outcome: Progressing 07/09/2022 0733 by Herma Carson, RN Outcome: Progressing   Problem: Activity: Goal: Ability to return to baseline activity level will improve 07/09/2022 0736 by Herma Carson, RN Outcome: Progressing 07/09/2022 0733 by Herma Carson, RN Outcome: Progressing   Problem: Cardiovascular: Goal: Ability to achieve and maintain adequate cardiovascular perfusion will improve 07/09/2022 0736 by Herma Carson, RN Outcome: Progressing 07/09/2022 0733 by Herma Carson, RN Outcome: Progressing Goal: Vascular  access site(s) Level 0-1 will be maintained 07/09/2022 0736 by Herma Carson, RN Outcome: Progressing 07/09/2022 0733 by Herma Carson, RN Outcome: Progressing   Problem: Health Behavior/Discharge Planning: Goal: Ability to safely manage health-related needs after  discharge will improve 07/09/2022 0736 by Herma Carson, RN Outcome: Progressing 07/09/2022 0733 by Herma Carson, RN Outcome: Progressing   Problem: Education: Goal: Knowledge of disease or condition will improve 07/09/2022 0736 by Herma Carson, RN Outcome: Progressing 07/09/2022 0733 by Herma Carson, RN Outcome: Progressing Goal: Understanding of discharge needs will improve 07/09/2022 0736 by Herma Carson, RN Outcome: Progressing 07/09/2022 0733 by Herma Carson, RN Outcome: Progressing   Problem: Health Behavior/Discharge Planning: Goal: Ability to identify changes in lifestyle to reduce recurrence of condition will improve 07/09/2022 0736 by Herma Carson, RN Outcome: Progressing 07/09/2022 0733 by Herma Carson, RN Outcome: Progressing Goal: Identification of resources available to assist in meeting health care needs will improve 07/09/2022 0736 by Herma Carson, RN Outcome: Progressing 07/09/2022 0733 by Herma Carson, RN Outcome: Progressing   Problem: Physical Regulation: Goal: Complications related to the disease process, condition or treatment will be avoided or minimized 07/09/2022 0736 by Herma Carson, RN Outcome: Progressing 07/09/2022 0733 by Herma Carson, RN Outcome: Progressing   Problem: Safety: Goal: Ability to remain free from injury will improve 07/09/2022 0736 by Herma Carson, RN Outcome: Progressing 07/09/2022 0733 by Herma Carson, RN Outcome: Progressing   Problem: Safety: Goal: Non-violent Restraint(s) 07/09/2022 0736 by Herma Carson, RN Outcome: Progressing 07/09/2022 0733 by Herma Carson, RN Outcome: Progressing   Problem: Education: Goal:  Ability to describe self-care measures that may prevent or decrease complications (Diabetes Survival Skills Education) will improve 07/09/2022 0736 by Herma Carson, RN Outcome: Progressing 07/09/2022 0733 by Herma Carson, RN Outcome: Progressing Goal: Individualized Educational Video(s) 07/09/2022 0736 by Herma Carson, RN Outcome: Progressing 07/09/2022 0733 by Herma Carson, RN Outcome: Progressing   Problem: Coping: Goal: Ability to adjust to condition or change in health will improve 07/09/2022 0736 by Herma Carson, RN Outcome: Progressing 07/09/2022 0733 by Herma Carson, RN Outcome: Progressing   Problem: Fluid Volume: Goal: Ability to maintain a balanced intake and output will improve 07/09/2022 0736 by Herma Carson, RN Outcome: Progressing 07/09/2022 0733 by Herma Carson, RN Outcome: Progressing   Problem: Health Behavior/Discharge Planning: Goal: Ability to identify and utilize available resources and services will improve 07/09/2022 0736 by Herma Carson, RN Outcome: Progressing 07/09/2022 0733 by Herma Carson, RN Outcome: Progressing Goal: Ability to manage health-related needs will improve 07/09/2022 0736 by Herma Carson, RN Outcome: Progressing 07/09/2022 0733 by Herma Carson, RN Outcome: Progressing   Problem: Metabolic: Goal: Ability to maintain appropriate glucose levels will improve 07/09/2022 0736 by Herma Carson, RN Outcome: Progressing 07/09/2022 0733 by Herma Carson, RN Outcome: Progressing   Problem: Nutritional: Goal: Maintenance of adequate nutrition will improve 07/09/2022 0736 by Herma Carson, RN Outcome: Progressing 07/09/2022 0733 by Herma Carson, RN Outcome: Progressing Goal: Progress toward achieving an optimal weight will improve 07/09/2022 0736 by Herma Carson, RN Outcome: Progressing 07/09/2022 0733 by Herma Carson, RN Outcome: Progressing   Problem: Skin Integrity: Goal: Risk for impaired skin  integrity will decrease 07/09/2022 0736 by Herma Carson, RN Outcome: Progressing 07/09/2022 0733 by Herma Carson, RN Outcome: Progressing   Problem: Tissue Perfusion: Goal: Adequacy of tissue perfusion will improve 07/09/2022 0736 by Herma Carson, RN Outcome: Progressing 07/09/2022 0733 by Herma Carson, RN Outcome: Progressing

## 2022-07-09 NOTE — Progress Notes (Signed)
Nutrition Follow-up  DOCUMENTATION CODES:   Non-severe (moderate) malnutrition in context of acute illness/injury (possible component of social/environmental)  INTERVENTION:  Glucerna Shake po TID, each supplement provides 220 kcal and 10 grams of protein RD recommends nocturnal TF's if patient's po intake continues to be poor:  -Vital AF 1.2 @70  x 12hrs (1800-0600)  Encourage po intake    NUTRITION DIAGNOSIS:   Moderate Malnutrition related to acute illness as evidenced by meal completion < 50%, energy intake < 75% for > 7 days, mild muscle depletion, moderate muscle depletion.   GOAL:   Patient will meet greater than or equal to 90% of their needs -ongoing   MONITOR:   TF tolerance, Labs, Weight trends, Skin  REASON FOR ASSESSMENT:   Consult Enteral/tube feeding initiation and management  ASSESSMENT:   77 y.o. male admits related to chest pain. PMH includes: alcoholic, HTN. Pt is currently receiving medical management related to unstable angina.  Labs: Glu 106, BUN 33 Meds: folvite, banatrol (held), lasix, insulin, juven, thiamine, vitamin B12, NS  Wt: admit wt- 232, CBW-220 or 233?  -variable wt records -RD suspects stable wt  PO: 0-50% po intake x last 8 documented meals  I/O's: -9.6 L   Visited patient at bedside who reports he ate today. Patient states he wants his cortrak removed. RD observed TF's not running and bottle of Vital AF 1.2 was empty. Per documentation, TF's were d/c'd on 4/8 due to diarrhea. Patient has had diarrhea due to taking lactulose which is no longer ordered. Patient's [name] Oneal is being held as well.   RD secure messaged patient's RN about how well he has been eating since patient was not able to give much detail. RN reports that MD hopes to remove FMS and cortrak tonight.    Diet Order:   Diet Order             Diet regular Fluid consistency: Thin  Diet effective now                   EDUCATION NEEDS:   Not appropriate for  education at this time  Skin:  Skin Assessment: Skin Integrity Issues: Skin Integrity Issues:: Unstageable, DTI DTI: bilateral heels, right posterior thigh Unstageable: bridge of nose from Bipap  Last BM:  4/7  Height:   Ht Readings from Last 1 Encounters:  06/20/22 5\' 10"  (1.778 m)    Weight:   Wt Readings from Last 1 Encounters:  07/09/22 101.8 kg    BMI:  Body mass index is 32.2 kg/m.  Estimated Nutritional Needs:   Kcal:  2000-2200 kcals  Protein:  115-135 g  Fluid:  >/= 2L    [name] Oneal, RDN, LDN  Clinical Nutrition

## 2022-07-09 NOTE — Progress Notes (Signed)
PROGRESS NOTE  [name] Oneal  DOB: [birth year]  PCP: Herby Abraham, MD WUJ:[id number]  DOA: 06/20/2022  LOS: 19 days  Hospital Day: 20  Brief narrative: [name] Oneal is a 77 y.o. male with PMH significant for chronic alcoholism, chronic smoking, CAD 3/23 Saturday, patient presented to the ED with complaint of chest pain.  He was noted to have NSTEMI and was admitted under cardiology service with a tentative plan to cath on Monday 3/25. 3/24, PCCM was consulted for alcohol withdrawal.  He was also hypoxic, suspected aspiration pneumonia.  Patient was transferred to ICU, subsequently started on Precedex drip, phenobarb.  Core track feeding was started. For the next several days in ICU, patient remains encephalopathic.  His mental status improved but kept waxing and waning. CT head was unremarkable.  EEG did not show any evidence of seizures. Gradually improved mental status 4/7, transferred out to [facility].  Subjective: Patient was seen and examined this morning. Lying on bed.  Appetite improving.  Nocturnal feeding held for last night.  Diarrhea improving.  Family not at bedside.  Assessment and plan: Acute toxic metabolic encephalopathy Acute EtOH withdrawal with delirium tremens Initially transferred to ICU for alcohol withdrawal symptoms.  Withdrawal symptoms resolved but now has ICU delirium.  While in ICU, patient required Precedex drip and phenobarbital. Currently on Seroquel 50 mg nightly.  Valproic acid have been stopped.  Clonidine in a tapering course.   Needs frequent reorientation.  Mental status gradually improving. Discussed with pharmacy.  Plan to minimize use of sedatives.  Stroke ruled out While in ICU, he was also suspected to have right hemineglect and hence stroke workup was done.  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  Dysphagia Moderate  protein calorie malnutrition Seen by SLP and nutritionist Currently on regular diet.  Oral appetite improving. Nocturnal feeding held for last night.  Diarrhea improving.  If appetite remains reliable and diarrhea does not recur, will plan to remove core track and Flexi-Seal this afternoon or tomorrow morning.  Chronic alcoholism Elevated liver enzymes Elevated ammonia 4/7, right upper quadrant ultrasound with no evidence of liver cirrhosis. Continue monitor LFTs Ammonia elevation could also be partly due to valproic acid.  Currently on lactulose.  Because of ongoing diarrhea, stop lactulose Recent Labs  Lab 07/04/22 0702 07/06/22 0530 07/08/22 0617  AST 110* 110* 111*  ALT 100* 113* 105*  ALKPHOS 92 100 87  BILITOT 0.4 0.6 0.7  PROT 6.5 6.6 6.5  ALBUMIN 2.1* 2.0* 2.0*  AMMONIA  --  39*  --   PLT 291 367 416*    Acute hypoxemic respiratory failure Aspiration pneumonia, resolved COPD exacerbation Chronic smoker Concern for OSA completed course of zosyn x 7 days Continue bronchodilators, pulmonary toileting Encouraged tobacco cessation Currently on 2 L oxygen nasal cannula.  Wean down as tolerated. May need outpatient sleep eval  NSTEMI History of CAD Initially admitted under cardiology service for NSTEMI.  Could not get cardiac cath as planned. Currently on aspirin Cardiology to follow Continue telemetry  Chronic diastolic CHF  Essential hypertension Echo 3/27 with EF 50 to 55%, grade 2 diastolic dysfunction, moderately elevated pulmonary artery systolic pressure Currently on Lopressor 50 mg twice daily, clonidine 0.2 mg 3 times daily, lisinopril 20 mg daily, hydralazine as needed  Blood pressure remains elevated to 170s.   Currently on Lasix 40 mg twice daily.  Pedal edema improving.  Creatinine stable.  Blood pressure stable.  Continue Lasix 40 mg daily for now.  AKI Resolved. Recent Labs    06/26/22 0655 06/27/22 0141 06/28/22 0229 06/29/22 0556  06/30/22 0110 07/01/22 0327 07/02/22 0149 07/04/22 0702 07/06/22 0530 07/08/22 0617  BUN 36* 40* 44* 54* 59* 60* 51* 37* 42* 33*  CREATININE 1.10 1.09 1.02 1.20 1.28* 1.34* 1.30* 0.98 0.99 0.90    Prediabetes A1c 5.9 on 3/27 Currently in sliding scale insulin while on tube feeding. Recent Labs  Lab 07/08/22 1638 07/08/22 2010 07/09/22 0006 07/09/22 0405 07/09/22 0755  GLUCAP 95 93 122* 93 89    Macrocytic anemia Vitamin B12, folate deficiency Hemoglobin stable. Continue vitamin B12 and folate supplement Recent Labs    06/20/22 0545 06/21/22 0239 07/01/22 0327 07/02/22 0149 07/04/22 0702 07/06/22 0530 07/08/22 0617  HGB  --    < > 13.0 12.7* 11.8* 11.3* 11.3*  MCV  --    < > 110.6* 108.7* 107.0* 108.1* 104.9*  VITAMINB12 130*  --   --   --   --   --   --   FOLATE 3.1*  --   --   --   --   --   --    < > = values in this interval not displayed.    Acute urinary retention 4/8, more than 600 mL urine retention noted.  Foley catheter inserted.  Plan to do voiding trial in 1 to 2 days   Severe deconditioning Impaired mobility PT, OT obtained.  SNF recommended  Left lower extremity cellulitis Pressure ulcers Left heel, right heel, right posterior thigh Wound care consult appreciated. Completed 7-day course of Zyvox. Continue low air mattress, Prevalon boots to offload pressure to the heels  Goals of care   Code Status: DNR    DVT prophylaxis:  enoxaparin (LOVENOX) injection 40 mg Start: 06/24/22 1130   Antimicrobials: Zyvox completed. Fluid: Not on IV hydration.   Consultants: None currently Family Communication: None at bedside  Status: Inpatient Level of care:  Telemetry Cardiac   Patient from: Home Needs to continue in-hospital care:  Gradual improving mental status.  SNF recommended   Diet:  Diet Order             Diet regular Fluid consistency: Thin  Diet effective now                   Scheduled Meds:  arformoterol  15 mcg  Nebulization BID   aspirin  81 mg Oral Daily   atorvastatin  80 mg Oral Daily   Chlorhexidine Gluconate Cloth  6 each Topical Daily   cloNIDine  0.1 mg Oral TID   Followed by   Melene Muller ON 07/10/2022] cloNIDine  0.1 mg Oral BID   Followed by   Melene Muller ON 07/12/2022] cloNIDine  0.1 mg Oral Daily   enoxaparin (LOVENOX) injection  40 mg Subcutaneous Q24H   fiber supplement (BANATROL TF)  60 mL Oral BID   folic acid  1 mg Per Tube Daily   furosemide  40 mg Oral BID   Gerhardt's butt cream   Topical BID   guaiFENesin  1,200 mg Oral BID   insulin aspart  0-9 Units Subcutaneous Q4H   insulin glargine-yfgn  8 Units Subcutaneous Daily   ipratropium-albuterol  3 mL Nebulization Q6H   lisinopril  20 mg Oral Daily   metoprolol tartrate  50 mg Oral BID   multivitamin with minerals  1 tablet Oral Daily   nicotine  21 mg Transdermal  Daily   nutrition supplement (JUVEN)  1 packet Oral BID BM   mouth rinse  15 mL Mouth Rinse 4 times per day   QUEtiapine  50 mg Oral QHS   revefenacin  175 mcg Nebulization Daily   sodium chloride flush  3 mL Intravenous Q12H   thiamine  100 mg Oral Daily   vitamin B-12  500 mcg Oral Daily   white petrolatum   Topical BID    PRN meds: sodium chloride, albuterol, haloperidol lactate, hydrALAZINE, nitroGLYCERIN, mouth rinse, sodium chloride flush   Infusions:   sodium chloride Stopped (07/05/22 1053)    Antimicrobials: Anti-infectives (From admission, onward)    Start     Dose/Rate Route Frequency Ordered Stop   07/01/22 1215  linezolid (ZYVOX) IVPB 600 mg        600 mg 300 mL/hr over 60 Minutes Intravenous Every 12 hours 07/01/22 1121 07/07/22 2359   06/28/22 1400  piperacillin-tazobactam (ZOSYN) IVPB 3.375 g        3.375 g 12.5 mL/hr over 240 Minutes Intravenous Every 8 hours 06/28/22 0941 07/03/22 0910   06/27/22 1100  cefTRIAXone (ROCEPHIN) 2 g in sodium chloride 0.9 % 100 mL IVPB  Status:  Discontinued        2 g 200 mL/hr over 30 Minutes Intravenous  Every 24 hours 06/27/22 1012 06/28/22 0941   06/26/22 1200  ceFEPIme (MAXIPIME) 2 g in sodium chloride 0.9 % 100 mL IVPB  Status:  Discontinued        2 g 200 mL/hr over 30 Minutes Intravenous Every 8 hours 06/26/22 1051 06/27/22 1012       Nutritional status:  Body mass index is 32.2 kg/m.  Nutrition Problem: Moderate Malnutrition Etiology: acute illness Signs/Symptoms: meal completion < 50%, energy intake < 75% for > 7 days, mild muscle depletion, moderate muscle depletion     Objective: Vitals:   07/09/22 0753 07/09/22 0817  BP: (!) 140/60 139/69  Pulse: 76 72  Resp: 18 (!) 25  Temp: 98.4 F (36.9 C) 98.2 F (36.8 C)  SpO2: 90% 97%    Intake/Output Summary (Last 24 hours) at 07/09/2022 1106 Last data filed at 07/09/2022 0310 Gross per 24 hour  Intake 970 ml  Output 2450 ml  Net -1480 ml    Filed Weights   07/07/22 0437 07/08/22 0418 07/09/22 0304  Weight: 110.6 kg 105.8 kg 101.8 kg   Weight change: -4 kg Body mass index is 32.2 kg/m.   Physical Exam: General exam: Elderly Caucasian male.  Not in distress.  Not in pain Skin: No rashes, lesions or ulcers. HEENT: Atraumatic, normocephalic, no obvious bleeding.  Cortrak tube in place. Lungs: Clear to auscultation bilaterally. CVS: Regular rate and rhythm, normal GI/Abd: Soft, nontender, distended.  Bowel sound present CNS: Alert, awake, dysarthric but able to have a limited conversation. Not restless or agitated at the time of my evaluation. Psychiatry: Mood appropriate Extremities: Pedal edema gradually improving, no calf tenderness  Data Review: I have personally reviewed the laboratory data and studies available.  F/u labs ordered Unresulted Labs (From admission, onward)     Start     Ordered   07/04/22 0500  CBC  Every 48 hours,   R     Question:  Specimen collection method  Answer:  Lab=Lab collect   07/02/22 1510   07/04/22 0500  Comprehensive metabolic panel  Every 48 hours,   R     Question:   Specimen collection method  Answer:  Lab=Lab  collect   07/02/22 1510   07/04/22 0500  Magnesium  Every 48 hours,   R     Question:  Specimen collection method  Answer:  Lab=Lab collect   07/02/22 1510   07/04/22 0500  Phosphorus  Every 48 hours,   R     Question:  Specimen collection method  Answer:  Lab=Lab collect   07/02/22 1510            Total time spent in review of labs and imaging, patient evaluation, formulation of plan, documentation and communication with family: 45 minutes  Signed, Lorin Glass, MD [facility] 07/09/2022

## 2022-07-09 NOTE — Progress Notes (Signed)
Physical Therapy Treatment Patient Details Name: [name] Oneal MRN: [id number] DOB: 15-Apr-1945 Today's Date: 07/09/2022   History of Present Illness 77 y/o gentleman presented with CP and was found to have an NSTEMI.  Hospital course has been complicated by severe alcohol withdrawal and delirium tremens. PMH: CAD, tobacco and ETOH abuse    PT Comments    Limited mobility to EOB due to pt self limiting and difficult to direct toward productive activities.    Recommendations for follow up therapy are one component of a multi-disciplinary discharge planning process, led by the attending physician.  Recommendations may be updated based on patient status, additional functional criteria and insurance authorization.  Follow Up Recommendations  Can patient physically be transported by private vehicle: No    Assistance Recommended at Discharge Frequent or constant Supervision/Assistance  Patient can return home with the following Two people to help with walking and/or transfers;Assistance with cooking/housework;Assist for transportation;Help with stairs or ramp for entrance   Equipment Recommendations  Other (comment) (TBD at next venue)    Recommendations for Other Services       Precautions / Restrictions Precautions Precautions: Fall Precaution Comments: confusion     Mobility  Bed Mobility Overal bed mobility: Needs Assistance Bed Mobility: Supine to Sit, Sit to Supine     Supine to sit: +2 for physical assistance, Total assist, HOB elevated Sit to supine: +2 for physical assistance, Max assist   General bed mobility comments: Assist with all aspects to EOB. Assist to lower trunk and bring legs back up into bed.    Transfers                   General transfer comment: unable to attempt safely    Ambulation/Gait                   Stairs             Wheelchair Mobility    Modified Rankin (Stroke Patients Only)       Balance Overall  balance assessment: History of Falls, Needs assistance Sitting-balance support: Feet supported, Bilateral upper extremity supported Sitting balance-Leahy Scale: Poor Sitting balance - Comments: UE support and min guard to min assist for static sitting.                                    Cognition Arousal/Alertness: Awake/alert Behavior During Therapy: Restless, Agitated Overall Cognitive Status: Impaired/Different from baseline Area of Impairment: Attention, Following commands, Problem solving, Awareness, Safety/judgement                 Orientation Level: Disoriented to, Place, Situation Current Attention Level: Focused Memory: Decreased short-term memory Following Commands: Follows one step commands with increased time, Follows one step commands inconsistently Safety/Judgement: Decreased awareness of safety, Decreased awareness of deficits Awareness: Intellectual Problem Solving: Slow processing, Decreased initiation, Difficulty sequencing, Requires verbal cues, Requires tactile cues General Comments: Pt requires constant encouragement to participate. Pt with bellicose, arguementative behavior.        Exercises      General Comments        Pertinent Vitals/Pain Pain Assessment Pain Assessment: Faces Faces Pain Scale: Hurts even more Pain Location: BLE/feet Pain Descriptors / Indicators: Grimacing, Guarding Pain Intervention(s): Limited activity within patient's tolerance, Monitored during session, Repositioned    Home Living  Prior Function            PT Goals (current goals can now be found in the care plan section) Progress towards PT goals: Not progressing toward goals - comment    Frequency    Min 1X/week      PT Plan Current plan remains appropriate    Co-evaluation              AM-PAC PT "6 Clicks" Mobility   Outcome Measure  Help needed turning from your back to your side while in a  flat bed without using bedrails?: Total Help needed moving from lying on your back to sitting on the side of a flat bed without using bedrails?: Total Help needed moving to and from a bed to a chair (including a wheelchair)?: Total Help needed standing up from a chair using your arms (e.g., wheelchair or bedside chair)?: Total Help needed to walk in hospital room?: Total Help needed climbing 3-5 steps with a railing? : Total 6 Click Score: 6    End of Session Equipment Utilized During Treatment:  (Pt refused O2. SpO2 90% on RA) Activity Tolerance: Other (comment) Patient left: in bed;with call bell/phone within reach;with family/visitor present;with bed alarm set Nurse Communication: Mobility status PT Visit Diagnosis: Unsteadiness on feet (R26.81);Other abnormalities of gait and mobility (R26.89);Repeated falls (R29.6);Muscle weakness (generalized) (M62.81);History of falling (Z91.81);Difficulty in walking, not elsewhere classified (R26.2);Other symptoms and signs involving the nervous system (R29.898);Adult, failure to thrive (R62.7);Pain Pain - Right/Left:  (bilateral) Pain - part of body: Ankle and joints of foot;Leg     Time: 9977-4142 PT Time Calculation (min) (ACUTE ONLY): 20 min  Charges:  $Therapeutic Activity: 8-22 mins                     [facility] PT Acute Rehabilitation Services Office [phone]    Angelina Ok [facility] 07/09/2022, 5:00 PM

## 2022-07-09 NOTE — Plan of Care (Signed)
  Problem: Education: Goal: Knowledge of General Education information will improve Description: Including pain rating scale, medication(s)/side effects and non-pharmacologic comfort measures Outcome: Progressing   Problem: Health Behavior/Discharge Planning: Goal: Ability to manage health-related needs will improve Outcome: Progressing   Problem: Clinical Measurements: Goal: Ability to maintain clinical measurements within normal limits will improve Outcome: Progressing Goal: Will remain free from infection Outcome: Progressing Goal: Diagnostic test results will improve Outcome: Progressing Goal: Respiratory complications will improve Outcome: Progressing Goal: Cardiovascular complication will be avoided Outcome: Progressing   Problem: Activity: Goal: Risk for activity intolerance will decrease Outcome: Progressing   Problem: Nutrition: Goal: Adequate nutrition will be maintained Outcome: Progressing   Problem: Coping: Goal: Level of anxiety will decrease Outcome: Progressing   Problem: Elimination: Goal: Will not experience complications related to bowel motility Outcome: Progressing Goal: Will not experience complications related to urinary retention Outcome: Progressing   Problem: Pain Managment: Goal: General experience of comfort will improve Outcome: Progressing   Problem: Safety: Goal: Ability to remain free from injury will improve Outcome: Progressing   Problem: Skin Integrity: Goal: Risk for impaired skin integrity will decrease Outcome: Progressing   Problem: Education: Goal: Understanding of CV disease, CV risk reduction, and recovery process will improve Outcome: Progressing Goal: Individualized Educational Video(s) Outcome: Progressing   Problem: Activity: Goal: Ability to return to baseline activity level will improve Outcome: Progressing   Problem: Cardiovascular: Goal: Ability to achieve and maintain adequate cardiovascular perfusion  will improve Outcome: Progressing Goal: Vascular access site(s) Level 0-1 will be maintained Outcome: Progressing   Problem: Health Behavior/Discharge Planning: Goal: Ability to safely manage health-related needs after discharge will improve Outcome: Progressing   Problem: Education: Goal: Knowledge of disease or condition will improve Outcome: Progressing Goal: Understanding of discharge needs will improve Outcome: Progressing   Problem: Health Behavior/Discharge Planning: Goal: Ability to identify changes in lifestyle to reduce recurrence of condition will improve Outcome: Progressing Goal: Identification of resources available to assist in meeting health care needs will improve Outcome: Progressing   Problem: Physical Regulation: Goal: Complications related to the disease process, condition or treatment will be avoided or minimized Outcome: Progressing   Problem: Safety: Goal: Ability to remain free from injury will improve Outcome: Progressing   Problem: Safety: Goal: Non-violent Restraint(s) Outcome: Progressing   Problem: Education: Goal: Ability to describe self-care measures that may prevent or decrease complications (Diabetes Survival Skills Education) will improve Outcome: Progressing Goal: Individualized Educational Video(s) Outcome: Progressing   Problem: Coping: Goal: Ability to adjust to condition or change in health will improve Outcome: Progressing   Problem: Fluid Volume: Goal: Ability to maintain a balanced intake and output will improve Outcome: Progressing   Problem: Health Behavior/Discharge Planning: Goal: Ability to identify and utilize available resources and services will improve Outcome: Progressing Goal: Ability to manage health-related needs will improve Outcome: Progressing   Problem: Metabolic: Goal: Ability to maintain appropriate glucose levels will improve Outcome: Progressing   Problem: Nutritional: Goal: Maintenance of  adequate nutrition will improve Outcome: Progressing Goal: Progress toward achieving an optimal weight will improve Outcome: Progressing   Problem: Skin Integrity: Goal: Risk for impaired skin integrity will decrease Outcome: Progressing   Problem: Tissue Perfusion: Goal: Adequacy of tissue perfusion will improve Outcome: Progressing

## 2022-07-09 NOTE — TOC Progression Note (Addendum)
Transition of Care [facility]) - Progression Note    Patient Details  Name: [name] Oneal MRN: [id number] Date of Birth: March 05, 1946  [facility] CM/SW Contact  Delilah Shan, LCSWA Phone Number: 07/09/2022, 2:44 PM  Clinical Narrative:     CSW spoke with patient at bedside. CSW updated patient in regards to PT recs. Patient agreeable to SNF. Patient wants CSW to continue to speak with his daughter regarding his dc plan/snf choice. CSW LVM for patients daughter. CSW awaiting call back. CSW received consult for substance use resources. CSW offered patient outpatient substance use treatment services resources. Patient politely declined. All questions answered. No further questions reported at this time.  Expected Discharge Plan: Skilled Nursing Facility Barriers to Discharge: Continued Medical Work up  Expected Discharge Plan and Services In-house Referral: Clinical Social Work Discharge Planning Services: CM Consult   Living arrangements for the past 2 months: Single Family Home                                       Social Determinants of Health (SDOH) Interventions SDOH Screenings   Food Insecurity: No Food Insecurity (06/20/2022)  Housing: Low Risk  (06/20/2022)  Transportation Needs: No Transportation Needs (06/20/2022)  Utilities: Not At Risk (06/20/2022)  Tobacco Use: High Risk (06/20/2022)    Readmission Risk Interventions     No data to display

## 2022-07-10 DIAGNOSIS — I200 Unstable angina: Secondary | ICD-10-CM | POA: Diagnosis not present

## 2022-07-10 LAB — GLUCOSE, CAPILLARY
Glucose-Capillary: 103 mg/dL — ABNORMAL HIGH (ref 70–99)
Glucose-Capillary: 99 mg/dL (ref 70–99)

## 2022-07-10 LAB — CBC
HCT: 34.5 % — ABNORMAL LOW (ref 39.0–52.0)
Hemoglobin: 11.8 g/dL — ABNORMAL LOW (ref 13.0–17.0)
MCH: 35.1 pg — ABNORMAL HIGH (ref 26.0–34.0)
MCHC: 34.2 g/dL (ref 30.0–36.0)
MCV: 102.7 fL — ABNORMAL HIGH (ref 80.0–100.0)
Platelets: 490 10*3/uL — ABNORMAL HIGH (ref 150–400)
RBC: 3.36 MIL/uL — ABNORMAL LOW (ref 4.22–5.81)
RDW: 14.4 % (ref 11.5–15.5)
WBC: 14.1 10*3/uL — ABNORMAL HIGH (ref 4.0–10.5)
nRBC: 0 % (ref 0.0–0.2)

## 2022-07-10 LAB — COMPREHENSIVE METABOLIC PANEL
ALT: 130 U/L — ABNORMAL HIGH (ref 0–44)
AST: 131 U/L — ABNORMAL HIGH (ref 15–41)
Albumin: 2.1 g/dL — ABNORMAL LOW (ref 3.5–5.0)
Alkaline Phosphatase: 90 U/L (ref 38–126)
Anion gap: 13 (ref 5–15)
BUN: 29 mg/dL — ABNORMAL HIGH (ref 8–23)
CO2: 25 mmol/L (ref 22–32)
Calcium: 8.5 mg/dL — ABNORMAL LOW (ref 8.9–10.3)
Chloride: 98 mmol/L (ref 98–111)
Creatinine, Ser: 0.93 mg/dL (ref 0.61–1.24)
GFR, Estimated: >60 mL/min (ref >60)
Glucose, Bld: 92 mg/dL (ref 70–99)
Potassium: 4 mmol/L (ref 3.5–5.1)
Sodium: 136 mmol/L (ref 135–145)
Total Bilirubin: 0.7 mg/dL (ref 0.3–1.2)
Total Protein: 6.6 g/dL (ref 6.5–8.1)

## 2022-07-10 LAB — PHOSPHORUS: Phosphorus: 3.8 mg/dL (ref 2.5–4.6)

## 2022-07-10 LAB — MAGNESIUM: Magnesium: 1.9 mg/dL (ref 1.7–2.4)

## 2022-07-10 MED ORDER — FOLIC ACID 1 MG PO TABS
1.0000 mg | ORAL_TABLET | Freq: Every day | ORAL | Status: DC
  Administered 2022-07-11 – 2022-07-14 (×4): 1 mg via ORAL
  Filled 2022-07-10 (×4): qty 1

## 2022-07-10 MED ORDER — IPRATROPIUM-ALBUTEROL 0.5-2.5 (3) MG/3ML IN SOLN
3.0000 mL | Freq: Two times a day (BID) | RESPIRATORY_TRACT | Status: DC
  Administered 2022-07-10 – 2022-07-13 (×5): 3 mL via RESPIRATORY_TRACT
  Filled 2022-07-10 (×4): qty 3
  Filled 2022-07-10: qty 42
  Filled 2022-07-10: qty 3

## 2022-07-10 MED ORDER — METOPROLOL TARTRATE 25 MG PO TABS
25.0000 mg | ORAL_TABLET | Freq: Two times a day (BID) | ORAL | Status: DC
  Administered 2022-07-10 – 2022-07-14 (×8): 25 mg via ORAL
  Filled 2022-07-10 (×8): qty 1

## 2022-07-10 NOTE — Progress Notes (Addendum)
  At approximately 0152, Pt was in severe bradycardia. Asymptomatic. Awake, alert and talking to  staff in room. By the time of completion of vital signs check at 0156 HR went up to  65, and continued to trend upwards. MD on call notified. Pt continued resting with no signs of distress.    07/10/22 0156  Vitals  BP (!) 97/43  MAP (mmHg) (!) 59  BP Location Right Arm  BP Method Automatic  Patient Position (if appropriate) Lying  Pulse Rate 65  ECG Heart Rate 67  Resp 18  Level of Consciousness  Level of Consciousness Alert  MEWS COLOR  MEWS Score Color Green  Oxygen Therapy  SpO2 96 %  MEWS Score  MEWS Temp 0  MEWS Systolic 1  MEWS Pulse 0  MEWS RR 0  MEWS LOC 0  MEWS Score 1

## 2022-07-10 NOTE — Progress Notes (Signed)
PROGRESS NOTE  [name] Oneal  DOB: 08-29-1945  PCP: Herby Abraham, MD OJJ:[id number]  DOA: 06/20/2022  LOS: 20 days  Hospital Day: 21  Brief narrative: [name] Oneal is a 77 y.o. male with PMH significant for chronic alcoholism, chronic smoking, CAD 3/23 Saturday, patient presented to the ED with complaint of chest pain.  He was noted to have NSTEMI and was admitted under cardiology service with a tentative plan to cath on Monday 3/25. 3/24, PCCM was consulted for alcohol withdrawal.  He was also hypoxic, suspected aspiration pneumonia.  Patient was transferred to ICU, subsequently started on Precedex drip, phenobarb.  Core track feeding was started. For the next several days in ICU, patient remains encephalopathic.  His mental status improved but kept waxing and waning. CT head was unremarkable.  EEG did not show any evidence of seizures. Gradually improved mental status 4/7, transferred out to [facility].  Subjective: Patient was seen and examined this morning. Lying down on it.  Mental status gradually improving but has episodes of confusion. Feels better after core track and Flexi-Seal removed yesterday. Per nursing documentation, patient had a brief asymptomatic drop in heart rate 20s while asleep.  Assessment and plan: Acute toxic metabolic encephalopathy Acute EtOH withdrawal with delirium tremens Initially transferred to ICU for alcohol withdrawal symptoms.  Withdrawal symptoms resolved but now has ICU delirium.  While in ICU, patient required Precedex drip and phenobarbital. Currently on Seroquel 50 mg nightly.  Valproic acid have been stopped.  Clonidine in a tapering course.   Needs frequent reorientation.  Mental status gradually improving. Discussed with pharmacy.  Plan to minimize use of sedatives.  Stroke ruled out While in ICU, he was also suspected to have right hemineglect and hence stroke workup was done.  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  Dysphagia Moderate protein calorie malnutrition Seen by SLP and nutritionist Required core track feeding for few days for core track removed on 4/11. Currently on regular diet.  Oral appetite improving.  Chronic alcoholism Elevated liver enzymes Elevated ammonia 4/7, right upper quadrant ultrasound with no evidence of liver cirrhosis. Continue monitor LFTs Ammonia elevation could also be partly due to valproic acid.  Currently on lactulose.  Because of ongoing diarrhea, stop lactulose Recent Labs  Lab 07/04/22 0702 07/06/22 0530 07/08/22 0617 07/10/22 0557  AST 110* 110* 111* 131*  ALT 100* 113* 105* 130*  ALKPHOS 92 100 87 90  BILITOT 0.4 0.6 0.7 0.7  PROT 6.5 6.6 6.5 6.6  ALBUMIN 2.1* 2.0* 2.0* 2.1*  AMMONIA  --  39*  --   --   PLT 291 367 416* 490*    Acute hypoxemic respiratory failure Aspiration pneumonia, resolved COPD exacerbation Chronic smoker Concern for OSA completed course of zosyn x 7 days Continue bronchodilators, pulmonary toileting Encouraged tobacco cessation Currently on 2 L oxygen nasal cannula.  Wean down as tolerated. May need outpatient sleep eval  NSTEMI History of CAD Initially admitted under cardiology service for NSTEMI.  Could not get cardiac cath as planned. Currently on aspirin Cardiology to follow Continue telemetry  Bradycardia  chronic diastolic CHF  Essential hypertension Echo 3/27 with EF 50 to 55%, grade 2 diastolic dysfunction, moderately elevated pulmonary artery systolic pressure Currently on Lopressor 50 mg twice daily, clonidine 0.2 mg 2 times daily, lisinopril 20 mg daily, and Lasix oral 40 mg twice daily hydralazine as needed  Blood pressure remains controlled.  Pedal edema improving.  Creatinine stable.   Patient had 1 episode of bradycardia down to 20s last night.  Will reduce blood pressure to 25 mg twice daily and  monitor.  AKI Resolved. Recent Labs    06/27/22 0141 06/28/22 0229 06/29/22 0556 06/30/22 0110 07/01/22 0327 07/02/22 0149 07/04/22 0702 07/06/22 0530 07/08/22 0617 07/10/22 0557  BUN 40* 44* 54* 59* 60* 51* 37* 42* 33* 29*  CREATININE 1.09 1.02 1.20 1.28* 1.34* 1.30* 0.98 0.99 0.90 0.93    Prediabetes A1c 5.9 on 3/27 Currently in sliding scale insulin while on tube feeding. Recent Labs  Lab 07/09/22 1155 07/09/22 1624 07/09/22 2009 07/10/22 0209 07/10/22 0457  GLUCAP 95 100* 99 103* 99    Macrocytic anemia Vitamin B12, folate deficiency Hemoglobin stable. Continue vitamin B12 and folate supplement Recent Labs    06/20/22 0545 06/21/22 0239 07/02/22 0149 07/04/22 0702 07/06/22 0530 07/08/22 0617 07/10/22 0557  HGB  --    < > 12.7* 11.8* 11.3* 11.3* 11.8*  MCV  --    < > 108.7* 107.0* 108.1* 104.9* 102.7*  VITAMINB12 130*  --   --   --   --   --   --   FOLATE 3.1*  --   --   --   --   --   --    < > = values in this interval not displayed.    Acute urinary retention 4/8, more than 600 mL urine retention noted.  Foley catheter inserted.  Plan to do voiding trial in 1 to 2 days  Severe deconditioning Impaired mobility PT, OT obtained.  SNF recommended  Left lower extremity cellulitis Pressure ulcers Left heel, right heel, right posterior thigh Wound care consult appreciated. Completed 7-day course of Zyvox. Continue low air mattress, Prevalon boots to offload pressure to the heels  Goals of care   Code Status: DNR    DVT prophylaxis:  enoxaparin (LOVENOX) injection 40 mg Start: 06/24/22 1130   Antimicrobials: Zyvox completed. Fluid: Not on IV hydration.   Consultants: None currently Family Communication: None at bedside  Status: Inpatient Level of care:  Telemetry Cardiac   Patient from: Home Needs to continue in-hospital care:  Gradual improving mental status.  SNF recommended   Diet:  Diet Order             Diet regular Fluid  consistency: Thin  Diet effective now                   Scheduled Meds:  arformoterol  15 mcg Nebulization BID   aspirin  81 mg Oral Daily   atorvastatin  80 mg Oral Daily   Chlorhexidine Gluconate Cloth  6 each Topical Daily   cloNIDine  0.1 mg Oral BID   Followed by   Melene Muller ON 07/12/2022] cloNIDine  0.1 mg Oral Daily   enoxaparin (LOVENOX) injection  40 mg Subcutaneous Q24H   feeding supplement (GLUCERNA SHAKE)  237 mL Oral TID BM   fiber supplement (BANATROL TF)  60 mL Oral BID   folic acid  1 mg Per Tube Daily   furosemide  40 mg Oral BID   Gerhardt's butt cream   Topical BID   guaiFENesin  1,200 mg Oral BID   insulin glargine-yfgn  8 Units Subcutaneous Daily   ipratropium-albuterol  3 mL Nebulization BID   lisinopril  20 mg Oral Daily   metoprolol tartrate  50 mg Oral BID   multivitamin with minerals  1 tablet Oral Daily  nicotine  21 mg Transdermal Daily   nutrition supplement (JUVEN)  1 packet Oral BID BM   mouth rinse  15 mL Mouth Rinse 4 times per day   QUEtiapine  50 mg Oral QHS   revefenacin  175 mcg Nebulization Daily   sodium chloride flush  3 mL Intravenous Q12H   thiamine  100 mg Oral Daily   vitamin B-12  500 mcg Oral Daily   white petrolatum   Topical BID    PRN meds: sodium chloride, albuterol, haloperidol lactate, hydrALAZINE, nitroGLYCERIN, mouth rinse, sodium chloride flush   Infusions:   sodium chloride Stopped (07/05/22 1053)    Antimicrobials: Anti-infectives (From admission, onward)    Start     Dose/Rate Route Frequency Ordered Stop   07/01/22 1215  linezolid (ZYVOX) IVPB 600 mg        600 mg 300 mL/hr over 60 Minutes Intravenous Every 12 hours 07/01/22 1121 07/07/22 2359   06/28/22 1400  piperacillin-tazobactam (ZOSYN) IVPB 3.375 g        3.375 g 12.5 mL/hr over 240 Minutes Intravenous Every 8 hours 06/28/22 0941 07/03/22 0910   06/27/22 1100  cefTRIAXone (ROCEPHIN) 2 g in sodium chloride 0.9 % 100 mL IVPB  Status:  Discontinued         2 g 200 mL/hr over 30 Minutes Intravenous Every 24 hours 06/27/22 1012 06/28/22 0941   06/26/22 1200  ceFEPIme (MAXIPIME) 2 g in sodium chloride 0.9 % 100 mL IVPB  Status:  Discontinued        2 g 200 mL/hr over 30 Minutes Intravenous Every 8 hours 06/26/22 1051 06/27/22 1012       Nutritional status:  Body mass index is 32.2 kg/m.  Nutrition Problem: Moderate Malnutrition Etiology: acute illness Signs/Symptoms: meal completion < 50%, energy intake < 75% for > 7 days, mild muscle depletion, moderate muscle depletion     Objective: Vitals:   07/10/22 0829 07/10/22 0830  BP:  (!) 156/67  Pulse:  71  Resp: 16   Temp:    SpO2:  91%    Intake/Output Summary (Last 24 hours) at 07/10/2022 1259 Last data filed at 07/10/2022 1111 Gross per 24 hour  Intake 478.5 ml  Output 2050 ml  Net -1571.5 ml    Filed Weights   07/07/22 0437 07/08/22 0418 07/09/22 0304  Weight: 110.6 kg 105.8 kg 101.8 kg   Weight change:  Body mass index is 32.2 kg/m.   Physical Exam: General exam: Elderly Caucasian male.  Not in distress.  Not in pain Skin: No rashes, lesions or ulcers. HEENT: Atraumatic, normocephalic, no obvious bleeding.  Has a wound on the bridge of the nose Lungs: Clear to auscultation bilaterally. CVS: Regular rate and rhythm, normal GI/Abd: Soft, nontender, distended.  Bowel sound present CNS: Alert, awake, dysarthric but able to have a limited conversation. Not restless or agitated at the time of my evaluation. Psychiatry: Mood appropriate Extremities: Pedal edema gradually improving, no calf tenderness  Data Review: I have personally reviewed the laboratory data and studies available.  F/u labs ordered Unresulted Labs (From admission, onward)     Start     Ordered   07/04/22 0500  CBC  Every 48 hours,   R     Question:  Specimen collection method  Answer:  Lab=Lab collect   07/02/22 1510   07/04/22 0500  Comprehensive metabolic panel  Every 48 hours,   R      Question:  Specimen collection method  Answer:  Lab=Lab collect   07/02/22 1510   07/04/22 0500  Magnesium  Every 48 hours,   R     Question:  Specimen collection method  Answer:  Lab=Lab collect   07/02/22 1510   07/04/22 0500  Phosphorus  Every 48 hours,   R     Question:  Specimen collection method  Answer:  Lab=Lab collect   07/02/22 1510            Total time spent in review of labs and imaging, patient evaluation, formulation of plan, documentation and communication with family: 45 minutes  Signed, Lorin Glass, MD [facility] 07/10/2022

## 2022-07-10 NOTE — TOC Progression Note (Addendum)
Transition of Care [facility]) - Progression Note    Patient Details  Name: [name] Oneal MRN: [id number] Date of Birth: Feb 03, 1946  [facility] CM/SW Contact  Delilah Shan, LCSWA Phone Number: 07/10/2022, 1:26 PM  Clinical Narrative:     CSW spoke with patients daughter [name] and provided SNF bed offers. Patients daughter chose SNF placement at [facility]. CSW awaiting call back from [place] with [facility] to confirm SNF bed.CSW following to start insurance authorization close to patient being medically ready for dc.  Update- CSW received call back from Limon with [facility] who confirmed they will have SNF bed for patient on Monday if medically ready. CSW informed MD.   Expected Discharge Plan: Skilled Nursing Facility Barriers to Discharge: Continued Medical Work up  Expected Discharge Plan and Services In-house Referral: Clinical Social Work Discharge Planning Services: CM Consult   Living arrangements for the past 2 months: Single Family Home                                       Social Determinants of Health (SDOH) Interventions SDOH Screenings   Food Insecurity: No Food Insecurity (06/20/2022)  Housing: Low Risk  (06/20/2022)  Transportation Needs: No Transportation Needs (06/20/2022)  Utilities: Not At Risk (06/20/2022)  Tobacco Use: High Risk (06/20/2022)    Readmission Risk Interventions     No data to display

## 2022-07-10 NOTE — Progress Notes (Signed)
Speech Language Pathology Treatment: Dysphagia  Patient Details Name: Ali Pascasio MRN: [id number] DOB: [birth year] Today's Date: 07/10/2022 Time: 8299-3716 SLP Time Calculation (min) (ACUTE ONLY): 11 min  Assessment / Plan / Recommendation Clinical Impression  Pt had his nasal cannula off upon SLP arrival with SpO2 dow not 86%, returning to the low 90s once nasal cannula was replaced. He denies any trouble chewing or swallowing. No overt signs of dysphagia or aspiration were observed across trials of regular solids and thin liquids despite being particularly distractible. Recommend continuing with regular solids and thin liquids with no additional SLP f/u needed at this time.    HPI HPI: Kairav Pongratz is a 77 year old male, daily smoker with CAD who was admitted 3/23 for chest pain noted to have an NSTEMI. Pt admitted to ICU. Course complicated by delirium, withdrawal, code stroke called and cancelled 4/2. Transferred to hospitalist service 4/7. Pt with cortrak in place.  Head CT with no acute findings.  CXR 4/6: "1. Lower lung volumes with increased generalized interstitial opacity. Cannot exclude mild or developing interstitial edema.  2. Patchy left lung base opacity although ventilation there appears improved since 06/30/2022. Favor atelectasis over other considerations." Pt with PMH significant for chronic alcoholism, chronic smoking, CAD.      SLP Plan  All goals met      Recommendations for follow up therapy are one component of a multi-disciplinary discharge planning process, led by the attending physician.  Recommendations may be updated based on patient status, additional functional criteria and insurance authorization.    Recommendations  Diet recommendations: Regular;Thin liquid Liquids provided via: Cup;Straw Medication Administration: Whole meds with liquid Supervision: Staff to assist with self feeding;Full supervision/cueing for compensatory  strategies Compensations: Slow rate;Small sips/bites;Minimize environmental distractions Postural Changes and/or Swallow Maneuvers: Seated upright 90 degrees                  Oral care BID   Frequent or constant Supervision/Assistance Dysphagia, oral phase (R13.11)     All goals met     Mahala Menghini., M.A. CCC-SLP Acute Rehabilitation Services Office [phone]  Secure chat preferred   07/10/2022, 12:38 PM

## 2022-07-10 NOTE — Progress Notes (Signed)
Occupational Therapy Treatment Patient Details Name: [name] Oneal MRN: [id number] DOB: 25-Apr-1945 Today's Date: 07/10/2022   History of present illness 77 y/o gentleman presented with CP and was found to have an NSTEMI.  Hospital course has been complicated by severe alcohol withdrawal and delirium tremens. PMH: CAD, tobacco and ETOH abuse   OT comments  Pt continuing to progress towards patient focused goals. Pt less agitated this OT session and willing to complete mobility to EOB. Pt remains limited by pain and decreased to complete OOB mobility. OT to continue to progress Pt as able, DC plans remain appropriate, frequency decreased d/t limited progression with functional activity at this time.    Recommendations for follow up therapy are one component of a multi-disciplinary discharge planning process, led by the attending physician.  Recommendations may be updated based on patient status, additional functional criteria and insurance authorization.    Assistance Recommended at Discharge Frequent or constant Supervision/Assistance  Patient can return home with the following  Other (comment) (NA for DC home)   Equipment Recommendations  None recommended by OT    Recommendations for Other Services      Precautions / Restrictions Precautions Precautions: Fall Precaution Comments: confusion Restrictions Weight Bearing Restrictions: No       Mobility Bed Mobility Overal bed mobility: Needs Assistance Bed Mobility: Supine to Sit, Sit to Supine     Supine to sit: HOB elevated, Max assist Sit to supine: Max assist   General bed mobility comments: Verbal and tactile cues to have patient assist with bed mobility and scooting up in bed    Transfers Overall transfer level: Needs assistance Equipment used: Rolling walker (2 wheels) Transfers: Sit to/from Stand Sit to Stand: From elevated surface, Max assist           General transfer comment: Pt was unable to fully  clear bottom, did not attempt again d/t safety concerns     Balance Overall balance assessment: History of Falls, Needs assistance Sitting-balance support: Feet supported, Bilateral upper extremity supported Sitting balance-Leahy Scale: Poor Sitting balance - Comments: UE supported on rail, Min guard static sitting EOB       Standing balance comment: Unable to stand today                           ADL either performed or assessed with clinical judgement   ADL Overall ADL's : Needs assistance/impaired                                       General ADL Comments: Focused session on Bed mobility and progressing OOB mobility    Extremity/Trunk Assessment              Vision       Perception     Praxis      Cognition Arousal/Alertness: Awake/alert Behavior During Therapy: Agitated, Restless Overall Cognitive Status: Impaired/Different from baseline Area of Impairment: Following commands, Safety/judgement, Problem solving, Awareness                   Current Attention Level: Focused   Following Commands: Follows one step commands inconsistently, Follows one step commands with increased time Safety/Judgement: Decreased awareness of deficits, Decreased awareness of safety Awareness: Intellectual Problem Solving: Slow processing, Decreased initiation, Requires verbal cues, Requires tactile cues General Comments: Pt more agreeable to OT session today, not  as agitated but did get upset with failed STS        Exercises      Shoulder Instructions       General Comments VSS on RA, Sacral wound changed by RN during session    Pertinent Vitals/ Pain       Pain Assessment Pain Assessment: Faces Faces Pain Scale: Hurts whole lot Pain Location: Pt reports pain all over body Pain Descriptors / Indicators: Aching, Sore, Grimacing  Home Living                                          Prior Functioning/Environment               Frequency  Min 1X/week (Frequency decreased d/t impaired OOB mobility at this time)        Progress Toward Goals  OT Goals(current goals can now be found in the care plan section)  Progress towards OT goals: Progressing toward goals (Pt limited in progression at this time)  Acute Rehab OT Goals OT Goal Formulation: Patient unable to participate in goal setting Time For Goal Achievement: 07/17/22 Potential to Achieve Goals: Fair  Plan Discharge plan remains appropriate;Frequency remains appropriate    Co-evaluation                 AM-PAC OT "6 Clicks" Daily Activity     Outcome Measure   Help from another person eating meals?: A Lot Help from another person taking care of personal grooming?: A Lot Help from another person toileting, which includes using toliet, bedpan, or urinal?: A Lot Help from another person bathing (including washing, rinsing, drying)?: Total Help from another person to put on and taking off regular upper body clothing?: Total Help from another person to put on and taking off regular lower body clothing?: Total 6 Click Score: 9    End of Session Equipment Utilized During Treatment: Gait belt;Rolling walker (2 wheels)  OT Visit Diagnosis: Unsteadiness on feet (R26.81);Other abnormalities of gait and mobility (R26.89);Repeated falls (R29.6);Muscle weakness (generalized) (M62.81);Other symptoms and signs involving cognitive function;Pain   Activity Tolerance Patient tolerated treatment well   Patient Left in bed;with call bell/phone within reach;with bed alarm set   Nurse Communication Mobility status;Need for lift equipment        Time: 3710-6269 OT Time Calculation (min): 32 min  Charges: OT General Charges $OT Visit: 1 Visit OT Treatments $Self Care/Home Management : 8-22 mins $Therapeutic Activity: 8-22 mins  07/10/2022  AB, OTR/L  Acute Rehabilitation Services  Office: [phone]   Tristan Schroeder 07/10/2022,  4:01 PM

## 2022-07-10 NOTE — Progress Notes (Signed)
Patient has refused all nutritional supplements today. He drank coffee for breakfast, refused lunch, and refused dinner. Nursing staff continues to motivate patient to eat, set up trays and try to order food/drinks that he enjoys.

## 2022-07-11 DIAGNOSIS — I200 Unstable angina: Secondary | ICD-10-CM | POA: Diagnosis not present

## 2022-07-11 MED ORDER — GABAPENTIN 300 MG PO CAPS
300.0000 mg | ORAL_CAPSULE | Freq: Every day | ORAL | Status: DC
  Administered 2022-07-11: 300 mg via ORAL
  Filled 2022-07-11: qty 1

## 2022-07-11 MED ORDER — ACETAMINOPHEN 325 MG PO TABS
650.0000 mg | ORAL_TABLET | Freq: Four times a day (QID) | ORAL | Status: DC | PRN
  Administered 2022-07-11 – 2022-07-12 (×3): 650 mg via ORAL
  Filled 2022-07-11 (×4): qty 2

## 2022-07-11 NOTE — Progress Notes (Signed)
Pt voided 400 ml using urinal at this time.

## 2022-07-11 NOTE — TOC Progression Note (Signed)
Transition of Care [facility]) - Progression Note    Patient Details  Name: [name] Oneal MRN: [id number] Date of Birth: [birth year]  [facility] of [facility]) CM/SW Contact  Mearl Latin, LCSW Phone Number: 07/11/2022, 1:11 PM  Clinical Narrative:    Insurance authorization started for [place], Ref# [id number].   Expected Discharge Plan: Skilled Nursing Facility Barriers to Discharge: Continued Medical Work up  Expected Discharge Plan and Services In-house Referral: Clinical Social Work Discharge Planning Services: CM Consult   Living arrangements for the past 2 months: Single Family Home                                       Social Determinants of Health (SDOH) Interventions SDOH Screenings   Food Insecurity: No Food Insecurity (06/20/2022)  Housing: Low Risk  (06/20/2022)  Transportation Needs: No Transportation Needs (06/20/2022)  Utilities: Not At Risk (06/20/2022)  Tobacco Use: High Risk (06/20/2022)    Readmission Risk Interventions     No data to display

## 2022-07-11 NOTE — Progress Notes (Signed)
Patient has not voided since foley discontinuation. Bladder scan obtained with a volume of 70 ml.

## 2022-07-11 NOTE — Progress Notes (Signed)
PROGRESS NOTE  [name] Oneal  DOB: October 24, 1945  PCP: Herby Abraham, MD ZOX:[id number]  DOA: 06/20/2022  LOS: 21 days  Hospital Day: 22  Brief narrative: [name] Oneal is a 77 y.o. male with PMH significant for chronic alcoholism, chronic smoking, CAD 3/23 Saturday, patient presented to the ED with complaint of chest pain.  He was noted to have NSTEMI and was admitted under cardiology service with a tentative plan to cath on Monday 3/25. 3/24, PCCM was consulted for alcohol withdrawal.  He was also hypoxic, suspected aspiration pneumonia.  Patient was transferred to ICU, subsequently started on Precedex drip, phenobarb.  Core track feeding was started. For the next several days in ICU, patient remains encephalopathic.  His mental status improved but kept waxing and waning. CT head was unremarkable.  EEG did not show any evidence of seizures. Gradually improved mental status 4/7, transferred out to [facility].  Subjective: Patient was seen and examined this morning. Lying down on it.  Mental status gradually improved without confusion. Flexiseal stopped and will DC foley today. Encouraged patient to get out of bed.  Per nursing documentation, patient had a brief asymptomatic drop in heart rate 20s while asleep. Will optimize his electrolytes.   Assessment and plan: Acute toxic metabolic encephalopathy Acute EtOH withdrawal with delirium tremens Initially transferred to ICU for alcohol withdrawal symptoms.  Withdrawal symptoms resolved but now has ICU delirium.  While in ICU, patient required Precedex drip and phenobarbital. Currently on Seroquel 50 mg nightly.  Valproic acid have been stopped.  Clonidine in a tapering course.   Needs frequent reorientation.  Mental status gradually improving. Discussed with pharmacy.  Plan to minimize use of sedatives.  Stroke ruled out While in ICU, he was also suspected to have right hemineglect and hence stroke workup was done.  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  Dysphagia Moderate protein calorie malnutrition Seen by SLP and nutritionist Required core track feeding for few days for core track removed on 4/11. Currently on regular diet.  Oral appetite improving.  Chronic alcoholism Elevated liver enzymes Elevated ammonia 4/7, right upper quadrant ultrasound with no evidence of liver cirrhosis. Continue monitor LFTs Ammonia elevation could also be partly due to valproic acid.  Currently on lactulose.  Because of ongoing diarrhea, stop lactulose Recent Labs  Lab 07/06/22 0530 07/08/22 0617 07/10/22 0557  AST 110* 111* 131*  ALT 113* 105* 130*  ALKPHOS 100 87 90  BILITOT 0.6 0.7 0.7  PROT 6.6 6.5 6.6  ALBUMIN 2.0* 2.0* 2.1*  AMMONIA 39*  --   --   PLT 367 416* 490*    Acute hypoxemic respiratory failure Aspiration pneumonia, resolved COPD exacerbation Chronic smoker Concern for OSA completed course of zosyn x 7 days Continue bronchodilators, pulmonary toileting Encouraged tobacco cessation Currently on 2 L oxygen nasal cannula.  Wean down as tolerated. May need outpatient sleep eval  NSTEMI History of CAD Initially admitted under cardiology service for NSTEMI.  Could not get cardiac cath as planned. Currently on aspirin Cardiology to follow Continue telemetry  Bradycardia  chronic diastolic CHF  Essential hypertension Echo 3/27 with EF 50 to 55%, grade 2 diastolic dysfunction, moderately elevated pulmonary artery systolic pressure Currently on Lopressor 50 mg twice daily, clonidine 0.2 mg 2 times daily, lisinopril 20 mg daily, and Lasix oral 40 mg twice daily hydralazine as needed  Blood pressure remains controlled. Pedal edema improving.  Creatinine stable.   Patient had 1 episode of bradycardia down to 20s last night.  Will reduce blood pressure to 25 mg twice daily and  monitor.  AKI Resolved. Recent Labs    06/27/22 0141 06/28/22 0229 06/29/22 0556 06/30/22 0110 07/01/22 0327 07/02/22 0149 07/04/22 0702 07/06/22 0530 07/08/22 0617 07/10/22 0557  BUN 40* 44* 54* 59* 60* 51* 37* 42* 33* 29*  CREATININE 1.09 1.02 1.20 1.28* 1.34* 1.30* 0.98 0.99 0.90 0.93    Prediabetes A1c 5.9 on 3/27 Currently in sliding scale insulin while on tube feeding. Recent Labs  Lab 07/09/22 1155 07/09/22 1624 07/09/22 2009 07/10/22 0209 07/10/22 0457  GLUCAP 95 100* 99 103* 99    Macrocytic anemia Vitamin B12, folate deficiency Hemoglobin stable. Continue vitamin B12 and folate supplement Recent Labs    06/20/22 0545 06/21/22 0239 07/02/22 0149 07/04/22 0702 07/06/22 0530 07/08/22 0617 07/10/22 0557  HGB  --    < > 12.7* 11.8* 11.3* 11.3* 11.8*  MCV  --    < > 108.7* 107.0* 108.1* 104.9* 102.7*  VITAMINB12 130*  --   --   --   --   --   --   FOLATE 3.1*  --   --   --   --   --   --    < > = values in this interval not displayed.    Acute urinary retention Remove foley with voiding trial.   Severe deconditioning Impaired mobility PT, OT obtained.  SNF recommended  Left lower extremity cellulitis Pressure ulcers Left heel, right heel, right posterior thigh Wound care consult appreciated. Completed 7-day course of Zyvox. 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 Fluid: Not on IV hydration.   Consultants: None currently Family Communication: None at bedside  Status: Inpatient Level of care:  Telemetry Cardiac   Patient from: Home Needs to continue in-hospital care:  Gradual improving mental status.  SNF recommended   Diet:  Diet Order             Diet regular Fluid consistency: Thin  Diet  effective now                   Scheduled Meds:  arformoterol  15 mcg Nebulization BID   aspirin  81 mg Oral Daily   atorvastatin  80 mg Oral Daily   Chlorhexidine Gluconate Cloth  6 each Topical Daily   cloNIDine  0.1 mg Oral BID   Followed by   Melene Muller ON 07/12/2022] cloNIDine  0.1 mg Oral Daily   enoxaparin (LOVENOX) injection  40 mg Subcutaneous Q24H   feeding supplement (GLUCERNA SHAKE)  237 mL Oral TID BM   fiber supplement (BANATROL TF)  60 mL Oral BID   folic acid  1 mg Oral Daily   furosemide  40 mg Oral BID   gabapentin  300 mg Oral QHS   Gerhardt's butt cream   Topical BID   guaiFENesin  1,200 mg Oral BID   insulin glargine-yfgn  8 Units Subcutaneous Daily   ipratropium-albuterol  3 mL Nebulization BID   lisinopril  20 mg Oral Daily   metoprolol tartrate  25 mg Oral BID   multivitamin with minerals  1 tablet Oral Daily   nicotine  21 mg  Transdermal Daily   nutrition supplement (JUVEN)  1 packet Oral BID BM   mouth rinse  15 mL Mouth Rinse 4 times per day   QUEtiapine  50 mg Oral QHS   revefenacin  175 mcg Nebulization Daily   sodium chloride flush  3 mL Intravenous Q12H   thiamine  100 mg Oral Daily   vitamin B-12  500 mcg Oral Daily   white petrolatum   Topical BID    PRN meds: sodium chloride, acetaminophen, albuterol, haloperidol lactate, hydrALAZINE, nitroGLYCERIN, mouth rinse, sodium chloride flush   Infusions:   sodium chloride Stopped (07/05/22 1053)    Antimicrobials: Anti-infectives (From admission, onward)    Start     Dose/Rate Route Frequency Ordered Stop   07/01/22 1215  linezolid (ZYVOX) IVPB 600 mg        600 mg 300 mL/hr over 60 Minutes Intravenous Every 12 hours 07/01/22 1121 07/07/22 2359   06/28/22 1400  piperacillin-tazobactam (ZOSYN) IVPB 3.375 g        3.375 g 12.5 mL/hr over 240 Minutes Intravenous Every 8 hours 06/28/22 0941 07/03/22 0910   06/27/22 1100  cefTRIAXone (ROCEPHIN) 2 g in sodium chloride 0.9 % 100 mL IVPB  Status:   Discontinued        2 g 200 mL/hr over 30 Minutes Intravenous Every 24 hours 06/27/22 1012 06/28/22 0941   06/26/22 1200  ceFEPIme (MAXIPIME) 2 g in sodium chloride 0.9 % 100 mL IVPB  Status:  Discontinued        2 g 200 mL/hr over 30 Minutes Intravenous Every 8 hours 06/26/22 1051 06/27/22 1012       Nutritional status:  Body mass index is 32.2 kg/m.  Nutrition Problem: Moderate Malnutrition Etiology: acute illness Signs/Symptoms: meal completion < 50%, energy intake < 75% for > 7 days, mild muscle depletion, moderate muscle depletion     Objective: Vitals:   07/11/22 0747 07/11/22 0851  BP:    Pulse:  77  Resp:    Temp:    SpO2: 95%    No intake or output data in the 24 hours ending 07/11/22 1159  Filed Weights   07/07/22 0437 07/08/22 0418 07/09/22 0304  Weight: 110.6 kg 105.8 kg 101.8 kg   Weight change:  Body mass index is 32.2 kg/m.   Physical Exam: General exam: Elderly Caucasian male.  Not in distress.  Not in pain Skin: No rashes, lesions or ulcers. HEENT: Atraumatic, normocephalic, no obvious bleeding.  Has a wound on the bridge of the nose Lungs: Clear to auscultation bilaterally. CVS: Regular rate and rhythm, normal GI/Abd: Soft, nontender, distended.  Bowel sound present CNS: Alert, awake, Not restless or agitated at the time of my evaluation. Psychiatry: Mood appropriate Extremities: Pedal edema gradually improving, no calf tenderness  Data Review: I have personally reviewed the laboratory data and studies available.  F/u labs ordered Unresulted Labs (From admission, onward)     Start     Ordered   07/04/22 0500  CBC  Every 48 hours,   R     Question:  Specimen collection method  Answer:  Lab=Lab collect   07/02/22 1510   07/04/22 0500  Comprehensive metabolic panel  Every 48 hours,   R     Question:  Specimen collection method  Answer:  Lab=Lab collect   07/02/22 1510   07/04/22 0500  Magnesium  Every 48 hours,   R     Question:  Specimen  collection method  Answer:  Lab=Lab  collect   07/02/22 1510   07/04/22 0500  Phosphorus  Every 48 hours,   R     Question:  Specimen collection method  Answer:  Lab=Lab collect   07/02/22 1510            Total time spent in review of labs and imaging, patient evaluation, formulation of plan, documentation and communication with family: 45 minutes  Signed, Alan Mulder, MD [facility] 07/11/2022

## 2022-07-12 LAB — COMPREHENSIVE METABOLIC PANEL
ALT: 141 U/L — ABNORMAL HIGH (ref 0–44)
AST: 109 U/L — ABNORMAL HIGH (ref 15–41)
Albumin: 2.1 g/dL — ABNORMAL LOW (ref 3.5–5.0)
Alkaline Phosphatase: 99 U/L (ref 38–126)
Anion gap: 13 (ref 5–15)
BUN: 20 mg/dL (ref 8–23)
CO2: 25 mmol/L (ref 22–32)
Calcium: 8.4 mg/dL — ABNORMAL LOW (ref 8.9–10.3)
Chloride: 100 mmol/L (ref 98–111)
Creatinine, Ser: 0.93 mg/dL (ref 0.61–1.24)
GFR, Estimated: >60 mL/min (ref >60)
Glucose, Bld: 119 mg/dL — ABNORMAL HIGH (ref 70–99)
Potassium: 4 mmol/L (ref 3.5–5.1)
Sodium: 138 mmol/L (ref 135–145)
Total Bilirubin: 0.8 mg/dL (ref 0.3–1.2)
Total Protein: 6.6 g/dL (ref 6.5–8.1)

## 2022-07-12 LAB — CBC
HCT: 35.5 % — ABNORMAL LOW (ref 39.0–52.0)
Hemoglobin: 11.7 g/dL — ABNORMAL LOW (ref 13.0–17.0)
MCH: 33.8 pg (ref 26.0–34.0)
MCHC: 33 g/dL (ref 30.0–36.0)
MCV: 102.6 fL — ABNORMAL HIGH (ref 80.0–100.0)
Platelets: 530 10*3/uL — ABNORMAL HIGH (ref 150–400)
RBC: 3.46 MIL/uL — ABNORMAL LOW (ref 4.22–5.81)
RDW: 14.3 % (ref 11.5–15.5)
WBC: 13.1 10*3/uL — ABNORMAL HIGH (ref 4.0–10.5)
nRBC: 0 % (ref 0.0–0.2)

## 2022-07-12 LAB — MAGNESIUM: Magnesium: 1.9 mg/dL (ref 1.7–2.4)

## 2022-07-12 LAB — PHOSPHORUS: Phosphorus: 3.5 mg/dL (ref 2.5–4.6)

## 2022-07-12 MED ORDER — GABAPENTIN 300 MG PO CAPS
300.0000 mg | ORAL_CAPSULE | Freq: Two times a day (BID) | ORAL | Status: DC
  Administered 2022-07-12 – 2022-07-14 (×5): 300 mg via ORAL
  Filled 2022-07-12 (×5): qty 1

## 2022-07-13 DIAGNOSIS — I200 Unstable angina: Secondary | ICD-10-CM | POA: Diagnosis not present

## 2022-07-13 MED ORDER — ENSURE ENLIVE PO LIQD
237.0000 mL | Freq: Three times a day (TID) | ORAL | Status: DC
  Administered 2022-07-14: 237 mL via ORAL

## 2022-07-13 NOTE — Plan of Care (Signed)
Patient ID: [name] Oneal, male   DOB: December 11, 1945, 77 y.o.   MRN: [id number]    Problem: Education: Goal: Knowledge of General Education information will improve Description: Including pain rating scale, medication(s)/side effects and non-pharmacologic comfort measures Outcome: Progressing   Problem: Health Behavior/Discharge Planning: Goal: Ability to manage health-related needs will improve Outcome: Progressing   Problem: Clinical Measurements: Goal: Ability to maintain clinical measurements within normal limits will improve Outcome: Progressing Goal: Will remain free from infection Outcome: Progressing Goal: Diagnostic test results will improve Outcome: Progressing Goal: Respiratory complications will improve Outcome: Progressing Goal: Cardiovascular complication will be avoided Outcome: Progressing   Problem: Activity: Goal: Risk for activity intolerance will decrease Outcome: Progressing   Problem: Nutrition: Goal: Adequate nutrition will be maintained Outcome: Progressing   Problem: Coping: Goal: Level of anxiety will decrease Outcome: Progressing   Problem: Elimination: Goal: Will not experience complications related to bowel motility Outcome: Progressing Goal: Will not experience complications related to urinary retention Outcome: Progressing   Problem: Pain Managment: Goal: General experience of comfort will improve Outcome: Progressing   Problem: Safety: Goal: Ability to remain free from injury will improve Outcome: Progressing   Problem: Skin Integrity: Goal: Risk for impaired skin integrity will decrease Outcome: Progressing   Problem: Education: Goal: Understanding of CV disease, CV risk reduction, and recovery process will improve Outcome: Progressing Goal: Individualized Educational Video(s) Outcome: Progressing   Problem: Activity: Goal: Ability to return to baseline activity level will improve Outcome: Progressing   Problem:  Cardiovascular: Goal: Ability to achieve and maintain adequate cardiovascular perfusion will improve Outcome: Progressing Goal: Vascular access site(s) Level 0-1 will be maintained Outcome: Progressing   Problem: Health Behavior/Discharge Planning: Goal: Ability to safely manage health-related needs after discharge will improve Outcome: Progressing   Problem: Education: Goal: Knowledge of disease or condition will improve Outcome: Progressing Goal: Understanding of discharge needs will improve Outcome: Progressing   Problem: Health Behavior/Discharge Planning: Goal: Ability to identify changes in lifestyle to reduce recurrence of condition will improve Outcome: Progressing Goal: Identification of resources available to assist in meeting health care needs will improve Outcome: Progressing   Problem: Physical Regulation: Goal: Complications related to the disease process, condition or treatment will be avoided or minimized Outcome: Progressing   Problem: Safety: Goal: Ability to remain free from injury will improve Outcome: Progressing   Problem: Safety: Goal: Non-violent Restraint(s) Outcome: Progressing   Problem: Education: Goal: Ability to describe self-care measures that may prevent or decrease complications (Diabetes Survival Skills Education) will improve Outcome: Progressing Goal: Individualized Educational Video(s) Outcome: Progressing   Problem: Coping: Goal: Ability to adjust to condition or change in health will improve Outcome: Progressing   Problem: Fluid Volume: Goal: Ability to maintain a balanced intake and output will improve Outcome: Progressing   Problem: Health Behavior/Discharge Planning: Goal: Ability to identify and utilize available resources and services will improve Outcome: Progressing Goal: Ability to manage health-related needs will improve Outcome: Progressing   Problem: Metabolic: Goal: Ability to maintain appropriate glucose levels  will improve Outcome: Progressing   Problem: Nutritional: Goal: Maintenance of adequate nutrition will improve Outcome: Progressing Goal: Progress toward achieving an optimal weight will improve Outcome: Progressing   Problem: Skin Integrity: Goal: Risk for impaired skin integrity will decrease Outcome: Progressing   Problem: Tissue Perfusion: Goal: Adequacy of tissue perfusion will improve Outcome: Progressing    Lidia Collum, RN

## 2022-07-13 NOTE — Progress Notes (Signed)
PROGRESS NOTE  [name] Oneal  DOB: 05-03-1945  PCP: Herby Abraham, MD YQM:[id number]  DOA: 06/20/2022  LOS: 23 days  Hospital Day: 24  Brief narrative: [name] Oneal is a 77 y.o. male with PMH significant for chronic alcoholism, chronic smoking, CAD 3/23 Saturday, patient presented to the ED with complaint of chest pain.  He was noted to have NSTEMI and was admitted under cardiology service with a tentative plan to cath on Monday 3/25. 3/24, PCCM was consulted for alcohol withdrawal.  He was also hypoxic, suspected aspiration pneumonia.  Patient was transferred to ICU, subsequently started on Precedex drip, phenobarb.  Core track feeding was started. For the next several days in ICU, patient remains encephalopathic.  His mental status improved but kept waxing and waning. CT head was unremarkable.  EEG did not show any evidence of seizures. Gradually improved mental status 4/7, transferred out to [place].  Subjective: Patient was seen and examined this morning. Patient was oriented and able to answer my questions.   Patient has completed the course of clonidine and Seroquel. Assessment and plan: Acute toxic metabolic encephalopathy Acute EtOH withdrawal with delirium tremens Initially transferred to ICU for alcohol withdrawal symptoms.  Withdrawal symptoms resolved but now has ICU delirium.  While in ICU, patient required Precedex drip and phenobarbital. Currently on Seroquel 50 mg nightly for 5 doses have been completed.  Valproic acid have been stopped.  Clonidine in a tapering course and has completed . Mental status today appeared to be stable. Discussed with pharmacy.  Plan to minimize use of sedatives. Patient has been weaned off all dizziness medications we will see how the patient performs overnight.  If he continues to do fine patient could be discharged tomorrow.  Stroke ruled out While in ICU, he was also suspected to have right hemineglect and hence stroke  workup was done.  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  Dysphagia Moderate protein calorie malnutrition Seen by SLP and nutritionist Required core track feeding for few days for core track removed on 4/11. Currently on regular diet.  Oral appetite improving.  Chronic alcoholism Elevated liver enzymes Elevated ammonia 4/7, right upper quadrant ultrasound with no evidence of liver cirrhosis. Continue monitor LFTs Ammonia elevation could also be partly due to valproic acid.  Currently on lactulose.  Because of ongoing diarrhea, stop lactulose Recent Labs  Lab 07/08/22 0617 07/10/22 0557 07/12/22 0531  AST 111* 131* 109*  ALT 105* 130* 141*  ALKPHOS 87 90 99  BILITOT 0.7 0.7 0.8  PROT 6.5 6.6 6.6  ALBUMIN 2.0* 2.1* 2.1*  PLT 416* 490* 530*    Acute hypoxemic respiratory failure Aspiration pneumonia, resolved COPD exacerbation Chronic smoker Concern for OSA completed course of zosyn x 7 days Continue bronchodilators, pulmonary toileting Encouraged tobacco cessation Patient also has been weaned off oxygen currently on room air. May need outpatient sleep eval  NSTEMI History of CAD Initially admitted under cardiology service for NSTEMI.  Could not get cardiac cath as planned. Currently on aspirin Cardiology to follow Continue telemetry Spoke with the cardiology PA on-call advised that the patient could be discharged home and have an outpatient follow-up with them. Patient currently does not have any active cardiac symptoms.  Bradycardia  chronic diastolic CHF  Essential hypertension Echo 3/27 with EF 50 to 55%, grade 2 diastolic dysfunction, moderately elevated pulmonary artery systolic pressure Currently on Lopressor 50 mg twice daily, clonidine  0.2 mg 2 times daily, lisinopril 20 mg daily, and Lasix oral 40 mg twice daily hydralazine as needed  Blood pressure  remains controlled. Pedal edema improving.  Creatinine stable.   Patient had 1 episode of bradycardia down to 20s last night.  Beta-blocker dose of metoprolol was reduced to 25 mg twice daily, no recurrent episodes of bradycardia.  AKI Resolved. Recent Labs    06/28/22 0229 06/29/22 0556 06/30/22 0110 07/01/22 0327 07/02/22 0149 07/04/22 0702 07/06/22 0530 07/08/22 0617 07/10/22 0557 07/12/22 0531  BUN 44* 54* 59* 60* 51* 37* 42* 33* 29* 20  CREATININE 1.02 1.20 1.28* 1.34* 1.30* 0.98 0.99 0.90 0.93 0.93    Prediabetes A1c 5.9 on 3/27 Currently in sliding scale insulin while on tube feeding. Recent Labs  Lab 07/09/22 1155 07/09/22 1624 07/09/22 2009 07/10/22 0209 07/10/22 0457  GLUCAP 95 100* 99 103* 99    Macrocytic anemia Vitamin B12, folate deficiency Hemoglobin stable. Continue vitamin B12 and folate supplement Recent Labs    06/20/22 0545 06/21/22 0239 07/04/22 0702 07/06/22 0530 07/08/22 0617 07/10/22 0557 07/12/22 0531  HGB  --    < > 11.8* 11.3* 11.3* 11.8* 11.7*  MCV  --    < > 107.0* 108.1* 104.9* 102.7* 102.6*  VITAMINB12 130*  --   --   --   --   --   --   FOLATE 3.1*  --   --   --   --   --   --    < > = values in this interval not displayed.    Acute urinary retention Remove foley with voiding trial.   Severe deconditioning Impaired mobility PT, OT obtained.  SNF recommended  Left lower extremity cellulitis Pressure ulcers Left heel, right heel, right posterior thigh Wound care consult appreciated. Completed 7-day course of Zyvox. 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 Fluid: Not on IV hydration.   Consultants: None currently Family Communication: None at bedside  Status: Inpatient Level  of care:  Telemetry Cardiac   Patient from: Home Needs to continue in-hospital care:  Gradual improving mental status.  SNF recommended   Diet:  Diet Order             DIET DYS 3 Room service appropriate? Yes; Fluid consistency: Thin  Diet effective now                   Scheduled Meds:  arformoterol  15 mcg Nebulization BID   aspirin  81 mg Oral Daily   atorvastatin  80 mg Oral Daily   enoxaparin (LOVENOX) injection  40 mg Subcutaneous Q24H   feeding supplement (GLUCERNA SHAKE)  237 mL Oral TID BM   fiber supplement (BANATROL TF)  60 mL Oral BID   folic acid  1 mg Oral Daily   furosemide  40 mg Oral BID   gabapentin  300 mg Oral BID   Gerhardt's butt cream   Topical BID   guaiFENesin  1,200 mg Oral BID   insulin glargine-yfgn  8 Units Subcutaneous Daily   lisinopril  20 mg Oral Daily   metoprolol tartrate  25 mg Oral BID   multivitamin with minerals  1 tablet Oral Daily   nicotine  21 mg Transdermal Daily  nutrition supplement (JUVEN)  1 packet Oral BID BM   mouth rinse  15 mL Mouth Rinse 4 times per day   revefenacin  175 mcg Nebulization Daily   sodium chloride flush  3 mL Intravenous Q12H   thiamine  100 mg Oral Daily   vitamin B-12  500 mcg Oral Daily   white petrolatum   Topical BID    PRN meds: sodium chloride, acetaminophen, albuterol, hydrALAZINE, nitroGLYCERIN, mouth rinse, sodium chloride flush   Infusions:   sodium chloride Stopped (07/05/22 1053)    Antimicrobials: Anti-infectives (From admission, onward)    Start     Dose/Rate Route Frequency Ordered Stop   07/01/22 1215  linezolid (ZYVOX) IVPB 600 mg        600 mg 300 mL/hr over 60 Minutes Intravenous Every 12 hours 07/01/22 1121 07/07/22 2359   06/28/22 1400  piperacillin-tazobactam (ZOSYN) IVPB 3.375 g        3.375 g 12.5 mL/hr over 240 Minutes Intravenous Every 8 hours 06/28/22 0941 07/03/22 0910   06/27/22 1100  cefTRIAXone (ROCEPHIN) 2 g in sodium chloride 0.9 % 100 mL IVPB  Status:   Discontinued        2 g 200 mL/hr over 30 Minutes Intravenous Every 24 hours 06/27/22 1012 06/28/22 0941   06/26/22 1200  ceFEPIme (MAXIPIME) 2 g in sodium chloride 0.9 % 100 mL IVPB  Status:  Discontinued        2 g 200 mL/hr over 30 Minutes Intravenous Every 8 hours 06/26/22 1051 06/27/22 1012       Nutritional status:  Body mass index is 32.9 kg/m.  Nutrition Problem: Moderate Malnutrition Etiology: acute illness Signs/Symptoms: meal completion < 50%, energy intake < 75% for > 7 days, mild muscle depletion, moderate muscle depletion     Objective: Vitals:   07/13/22 0709 07/13/22 1351  BP:  (!) 148/72  Pulse:  66  Resp:  18  Temp:  98.7 F (37.1 C)  SpO2: 94% 92%    Intake/Output Summary (Last 24 hours) at 07/13/2022 1610 Last data filed at 07/13/2022 1436 Gross per 24 hour  Intake 300 ml  Output 800 ml  Net -500 ml    Filed Weights   07/09/22 0304 07/12/22 0408 07/13/22 0526  Weight: 101.8 kg 108.1 kg 104 kg   Weight change: -4.1 kg Body mass index is 32.9 kg/m.   Physical Exam: General exam: Elderly Caucasian male.  Not in distress.  Not in pain Skin: No rashes, lesions or ulcers. HEENT: Atraumatic, normocephalic, no obvious bleeding.  Has a wound on the bridge of the nose Lungs: Clear to auscultation bilaterally. CVS: Regular rate and rhythm, normal GI/Abd: Soft, nontender, distended.  Bowel sound present CNS: Alert, awake, Not restless or agitated at the time of my evaluation. Psychiatry: Mood appropriate Extremities: Pedal edema gradually improving, no calf tenderness  Data Review: I have personally reviewed the laboratory data and studies available.  F/u labs ordered Unresulted Labs (From admission, onward)     Start     Ordered   07/04/22 0500  Magnesium  Every 48 hours,   R     Question:  Specimen collection method  Answer:  Lab=Lab collect   07/02/22 1510   07/04/22 0500  Phosphorus  Every 48 hours,   R     Question:  Specimen collection  method  Answer:  Lab=Lab collect   07/02/22 1510            Total time spent in review  of labs and imaging, patient evaluation, formulation of plan, documentation and communication with family: 45 minutes  Signed, Harold Hedge, MD [facility] 07/13/2022

## 2022-07-13 NOTE — Progress Notes (Signed)
Physical Therapy Treatment Patient Details Name: [name] Oneal MRN: [id number] DOB: 1945/09/15 Today's Date: 07/13/2022   History of Present Illness 77 y/o gentleman presented with CP and was found to have an NSTEMI.  Hospital course has been complicated by severe alcohol withdrawal and delirium tremens. PMH: CAD, tobacco and ETOH abuse    PT Comments    Pt with greatly improved cognition and participation as well as improved mobility. Pt pleasant and eager to work with therapy. Able to stand multiple times at bedside with [facility] and then rolling walker. Returned to bed instead of chair due to pt leaving for SNF this PM per family.    Recommendations for follow up therapy are one component of a multi-disciplinary discharge planning process, led by the attending physician.  Recommendations may be updated based on patient status, additional functional criteria and insurance authorization.  Follow Up Recommendations  Can patient physically be transported by private vehicle: No    Assistance Recommended at Discharge Frequent or constant Supervision/Assistance  Patient can return home with the following Two people to help with walking and/or transfers;Assistance with cooking/housework;Assist for transportation;Help with stairs or ramp for entrance   Equipment Recommendations  Other (comment) (TBD at next venue)    Recommendations for Other Services       Precautions / Restrictions Precautions Precautions: Fall     Mobility  Bed Mobility Overal bed mobility: Needs Assistance Bed Mobility: Supine to Sit, Sit to Sidelying     Supine to sit: +2 for physical assistance, HOB elevated, Min assist   Sit to sidelying: +2 for physical assistance, Min assist General bed mobility comments: Assist to elevate trunk into sitting and bring hips to EOB. Assist to lower trunk and to bring legs back up into bed    Transfers Overall transfer level: Needs assistance Equipment used: Rolling  walker (2 wheels), Ambulation equipment used Transfers: Sit to/from Stand Sit to Stand: +2 physical assistance, Min assist, Mod assist, From elevated surface           General transfer comment: With Stedy +2 min assist to power up and for balance. With rolling walker +2 mod assist to power up and for balance especially with transition of hand from pushing up on bed to placement on walker.    Ambulation/Gait             Pre-gait activities: Stood with Stedy x 2 for 45-60 sec each. Stood with walker x 1 for 20 sec General Gait Details: Did not attempt   Stairs             Wheelchair Mobility    Modified Rankin (Stroke Patients Only)       Balance Overall balance assessment: History of Falls, Needs assistance Sitting-balance support: Feet supported, No upper extremity supported Sitting balance-Leahy Scale: Fair     Standing balance support: Bilateral upper extremity supported, During functional activity, Reliant on assistive device for balance Standing balance-Leahy Scale: Poor Standing balance comment: +2 min assist to maintain static standing with Stedy and then rolling walker                            Cognition Arousal/Alertness: Awake/alert Behavior During Therapy: WFL for tasks assessed/performed Overall Cognitive Status: Impaired/Different from baseline Area of Impairment: Problem solving, Safety/judgement, Memory, Attention                     Memory: Decreased short-term memory Following Commands: Follows  one step commands with increased time, Follows one step commands inconsistently Safety/Judgement: Decreased awareness of safety   Problem Solving: Slow processing, Requires verbal cues General Comments: Pt with much improved cognition since last visit        Exercises      General Comments General comments (skin integrity, edema, etc.): VSS on RA      Pertinent Vitals/Pain Pain Assessment Pain Assessment: Faces Faces  Pain Scale: Hurts little more Pain Location: Bil feet Pain Descriptors / Indicators: Grimacing, Guarding    Home Living                          Prior Function            PT Goals (current goals can now be found in the care plan section) Progress towards PT goals: Progressing toward goals    Frequency    Min 1X/week      PT Plan Current plan remains appropriate    Co-evaluation              AM-PAC PT "6 Clicks" Mobility   Outcome Measure  Help needed turning from your back to your side while in a flat bed without using bedrails?: Total Help needed moving from lying on your back to sitting on the side of a flat bed without using bedrails?: Total Help needed moving to and from a bed to a chair (including a wheelchair)?: Total Help needed standing up from a chair using your arms (e.g., wheelchair or bedside chair)?: Total Help needed to walk in hospital room?: Total Help needed climbing 3-5 steps with a railing? : Total 6 Click Score: 6    End of Session Equipment Utilized During Treatment: Gait belt Activity Tolerance: Other (comment) Patient left: in bed;with call bell/phone within reach;with bed alarm set Nurse Communication: Mobility status PT Visit Diagnosis: Unsteadiness on feet (R26.81);Other abnormalities of gait and mobility (R26.89);Repeated falls (R29.6);Muscle weakness (generalized) (M62.81);History of falling (Z91.81);Difficulty in walking, not elsewhere classified (R26.2);Other symptoms and signs involving the nervous system (R29.898);Adult, failure to thrive (R62.7);Pain Pain - Right/Left:  (bilateral) Pain - part of body: Ankle and joints of foot     Time: 3559-7416 PT Time Calculation (min) (ACUTE ONLY): 24 min  Charges:  $Therapeutic Activity: 23-37 mins                     [facility] PT Acute Rehabilitation Services Office [phone]    Angelina Ok [facility] 07/13/2022, 4:31 PM

## 2022-07-13 NOTE — TOC Progression Note (Signed)
Transition of Care [facility]) - Progression Note    Patient Details  Name: [name] Oneal MRN: [id number] Date of Birth: 16-Nov-1945  [facility] of [facility] CM/SW Contact  Delilah Shan, LCSWA Phone Number: 07/13/2022, 11:48 AM  Clinical Narrative:     Patients insurance authorization has been approved. Plan Auth ID# [id number] Auth ID# [id number]. Insurance authorization has been approved from 4/14-4/17.Kitty with [facility] confirmed she can accept patient for dc when medically ready. CSW informed MD. CSW will continue to follow and assist with patients dc planning needs.  Expected Discharge Plan: Skilled Nursing Facility Barriers to Discharge: Continued Medical Work up  Expected Discharge Plan and Services In-house Referral: Clinical Social Work Discharge Planning Services: CM Consult   Living arrangements for the past 2 months: Single Family Home                                       Social Determinants of Health (SDOH) Interventions SDOH Screenings   Food Insecurity: No Food Insecurity (06/20/2022)  Housing: Low Risk  (06/20/2022)  Transportation Needs: No Transportation Needs (06/20/2022)  Utilities: Not At Risk (06/20/2022)  Tobacco Use: High Risk (06/20/2022)    Readmission Risk Interventions     No data to display

## 2022-07-13 NOTE — Progress Notes (Signed)
Nutrition Follow-up  DOCUMENTATION CODES:   Non-severe (moderate) malnutrition in context of acute illness/injury (possible component of social/environmental)  INTERVENTION:   Continues Dysphagia 3 diet  Change Glucerna shake to Ensure Enlive po TID, each supplement provides 350 kcal and 20 grams of protein, to provide more calories and protein per ounce  MVI with Minerals, thiamine, folic acid, B12  Juven BID, each packet provides 80 calories, 8 grams of carbohydrate, 2.5  grams of protein (collagen), 7 grams of L-arginine and 7 grams of L-glutamine; supplement contains CaHMB, Vitamins C, E, B12 and Zinc to promote wound healing   NUTRITION DIAGNOSIS:   Moderate Malnutrition related to acute illness as evidenced by meal completion < 50%, energy intake < 75% for > 7 days, mild muscle depletion, moderate muscle depletion.  Being addressed via supplements  GOAL:   Patient will meet greater than or equal to 90% of their needs  Progressing   MONITOR:   TF tolerance, Labs, Weight trends, Skin  REASON FOR ASSESSMENT:   Consult Enteral/tube feeding initiation and management  ASSESSMENT:   77 y.o. male admits related to chest pain. PMH includes: alcoholic, HTN. Pt is currently receiving medical management related to unstable angina.  4/11 Cortrak removed, TF discontinued  Noted pt has been working with PT  Pt is edentulous (no dentures) and diet has been downgraded to Dysphagia 3. Reorded po intake mostly 0-5% of meals. Pt reports he is drinking 1-2 nutrition shakes per day. Noted pt currently on Glucerna; given minimal intake of meals, plan to to change to Ensure/Boost as these have more calories and more protein per ounce.   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  Pt does not elaborate more on what he means regarding he is "giving up" at this time. Notified RN  Labs: reviewed Meds: semglee, folic acid, lasix, thiamine, B12, folic acid, MVI   Diet Order:   Diet Order             DIET DYS 3 Room service appropriate? Yes; Fluid consistency: Thin  Diet effective now                   EDUCATION NEEDS:   Not appropriate for education at this time  Skin:  Skin Assessment: Skin Integrity Issues: Skin Integrity Issues:: Unstageable, DTI DTI: bilateral heels, right posterior thigh Unstageable: bridge of nose from Bipap  Last BM:  4/11 via FMS  Height:   Ht Readings from Last 1 Encounters:  06/20/22 5\' 10"  (1.778 m)    Weight:   Wt Readings from Last 1 Encounters:  07/13/22 104 kg    BMI:  Body mass index is 32.9 kg/m.  Estimated Nutritional Needs:   Kcal:  2000-2200 kcals  Protein:  115-135 g  Fluid:  >/= 2L   Romelle Starcher MS, RDN, LDN, CNSC Registered Dietitian 3 Clinical Nutrition RD Pager and On-Call Pager Number Located in [place]

## 2022-07-14 LAB — MAGNESIUM: Magnesium: 1.9 mg/dL (ref 1.7–2.4)

## 2022-07-14 LAB — PHOSPHORUS: Phosphorus: 3.5 mg/dL (ref 2.5–4.6)

## 2022-07-14 MED ORDER — FOLIC ACID 1 MG PO TABS: 1.0000 mg | ORAL_TABLET | Freq: Every day | ORAL | 0 refills | Status: DC

## 2022-07-14 MED ORDER — LISINOPRIL 20 MG PO TABS: 20.0000 mg | ORAL_TABLET | Freq: Every day | ORAL | 0 refills | Status: DC

## 2022-07-14 MED ORDER — ASPIRIN 81 MG PO CHEW: 81.0000 mg | CHEWABLE_TABLET | Freq: Every day | ORAL | 0 refills | Status: DC

## 2022-07-14 MED ORDER — ATORVASTATIN CALCIUM 80 MG PO TABS: 80.0000 mg | ORAL_TABLET | Freq: Every day | ORAL | 0 refills | Status: DC

## 2022-07-14 MED ORDER — BISACODYL 10 MG RE SUPP
10.0000 mg | Freq: Once | RECTAL | Status: AC
  Administered 2022-07-14: 10 mg via RECTAL
  Filled 2022-07-14: qty 1

## 2022-07-14 MED ORDER — METOPROLOL TARTRATE 25 MG PO TABS: 25.0000 mg | ORAL_TABLET | Freq: Two times a day (BID) | ORAL | 0 refills | Status: DC

## 2022-07-14 MED ORDER — VITAMIN B-1 100 MG PO TABS: 100.0000 mg | ORAL_TABLET | Freq: Every day | ORAL | 0 refills | Status: DC

## 2022-07-14 MED ORDER — FUROSEMIDE 40 MG PO TABS: 40.0000 mg | ORAL_TABLET | Freq: Two times a day (BID) | ORAL | 0 refills | Status: DC

## 2022-07-14 MED ORDER — INSULIN GLARGINE-YFGN 100 UNIT/ML ~~LOC~~ SOLN: 8.0000 [IU] | Freq: Every day | SUBCUTANEOUS | 0 refills | Status: DC

## 2022-07-14 MED ORDER — MAGNESIUM CITRATE PO SOLN
1.0000 | Freq: Once | ORAL | Status: AC
  Administered 2022-07-14: 1 via ORAL
  Filled 2022-07-14: qty 296

## 2022-07-14 MED ORDER — GABAPENTIN 300 MG PO CAPS: 300.0000 mg | ORAL_CAPSULE | Freq: Two times a day (BID) | ORAL | 0 refills | Status: DC

## 2022-07-14 MED ORDER — NICOTINE 21 MG/24HR TD PT24: 21.0000 mg | MEDICATED_PATCH | Freq: Every day | TRANSDERMAL | 0 refills | Status: DC

## 2022-07-14 MED ORDER — CYANOCOBALAMIN 500 MCG PO TABS: 500.0000 ug | ORAL_TABLET | Freq: Every day | ORAL | 0 refills | Status: DC

## 2022-07-14 MED ORDER — ADULT MULTIVITAMIN W/MINERALS CH: 1.0000 | ORAL_TABLET | Freq: Every day | ORAL | 0 refills | Status: DC

## 2022-07-14 NOTE — Discharge Summary (Signed)
Physician Discharge Summary   Patient: [name] Oneal MRN: [id number] DOB: [birth year]  Admit date:     06/20/2022  Discharge date: 07/14/22  Discharge Physician: Harold Hedge   PCP: Herby Abraham, MD   Recommendations at discharge:    Fllow up with PCP and Cardiology in 1-2 weeks.  Discharge Diagnoses: Principal Problem:   Unstable angina Active Problems:   Acute respiratory failure with hypoxia and hypercapnia   COPD with acute exacerbation   Acute metabolic encephalopathy, resolved.   NSTEMI (non-ST elevated myocardial infarction)   Delirium tremens   Hyperglycemia   Hypernatremia   Aspiration pneumonia of right lower lobe   Alcohol withdrawal syndrome, with delirium   Moderate protein-energy malnutrition   Pneumonia of left lower lobe due to infectious organism   AKI (acute kidney injury)    Hospital Course: [name] Oneal is a 77 y.o. male with PMH significant for chronic alcoholism, chronic smoking, CAD 3/23 Saturday, patient presented to the ED with complaint of chest pain.  He was noted to have NSTEMI and was admitted under cardiology service with a tentative plan to cath on Monday 3/25. 3/24, PCCM was consulted for alcohol withdrawal.  He was also hypoxic, suspected aspiration pneumonia.  Patient was transferred to ICU, subsequently started on Precedex drip, phenobarb.  Core track feeding was started. For the next several days in ICU, patient remains encephalopathic.  Patient also had right hemineglect while he was in the ICU.  Neurology was consulted, stroke was ruled out.  His mental status improved but kept waxing and waning. CT head was unremarkable.  EEG did not show any evidence of seizures.  Patient was placed on clonidine, Seroquel along with initial Precedex and phenobarbital.  Currently patient has been weaned off all medications.  Does not have any withdrawal symptoms.  No signs of agitation.  Patient alert and oriented and having  appropriate conversation. For acute hypoxemic respiratory failure aspiration pneumonia and COPD exacerbation was found to be the reason.  Patient has completed 7 days of antibiotics, treated with as needed bronchodilators.  Currently patient has been weaned off oxygen and is on room air. COPD exacerbation is resolved.  Also during the hospital stay patient had some bradycardia and has chronic diastolic dysfunction with moderately elevated pulmonary artery pressure.  Dose of Lopressor decreased to 25.  Patient is currently euvolemic.  Patient currently does not have any chest pain or palpitations.  Originally was admitted for NSTEMI discussed with the cardiologist advised that the patient could be discharged and have an outpatient follow-up with them.  Secondary to chronic alcoholism patient has chronically elevated liver enzymes.  Patient was evaluated with an ultrasound of the liver that was negative for cirrhosis.  Currently LFTs are fairly stable patient is asymptomatic.  Patient also had acute kidney injury during the hospital stay which is resolved and kidney function is stable.  Patient also had hyperglycemia during the hospital stay mother the hemoglobin A1c was prediabetic 5.9 currently blood sugars are fairly well-controlled on insulin glargine/Semglee 8 units daily.  Patient is currently being discharged on the same.         Consultants: Cardiology, neurology Procedures performed: None Disposition: Skilled nursing facility Diet recommendation:  Discharge Diet Orders (From admission, onward)     Start     Ordered   07/14/22 0000  Diet - low sodium heart healthy        07/14/22 1222           Cardiac  diet DISCHARGE MEDICATION: Allergies as of 07/14/2022   No Known Allergies      Medication List     TAKE these medications    aspirin 81 MG chewable tablet Chew 1 tablet (81 mg total) by mouth daily. Start taking on: July 15, 2022   atorvastatin 80 MG tablet Commonly  known as: LIPITOR Take 1 tablet (80 mg total) by mouth daily. Start taking on: July 15, 2022   cyanocobalamin 500 MCG tablet Commonly known as: VITAMIN B12 Take 1 tablet (500 mcg total) by mouth daily. Start taking on: July 15, 2022   folic acid 1 MG tablet Commonly known as: FOLVITE Take 1 tablet (1 mg total) by mouth daily. Start taking on: July 15, 2022   furosemide 40 MG tablet Commonly known as: LASIX Take 1 tablet (40 mg total) by mouth 2 (two) times daily.   gabapentin 300 MG capsule Commonly known as: NEURONTIN Take 1 capsule (300 mg total) by mouth 2 (two) times daily.   insulin glargine-yfgn 100 UNIT/ML injection Commonly known as: SEMGLEE Inject 0.08 mLs (8 Units total) into the skin daily. Start taking on: July 15, 2022   lisinopril 20 MG tablet Commonly known as: ZESTRIL Take 1 tablet (20 mg total) by mouth daily. Start taking on: July 15, 2022   metoprolol tartrate 25 MG tablet Commonly known as: LOPRESSOR Take 1 tablet (25 mg total) by mouth 2 (two) times daily.   multivitamin with minerals Tabs tablet Take 1 tablet by mouth daily. Start taking on: July 15, 2022   nicotine 21 mg/24hr patch Commonly known as: NICODERM CQ - dosed in mg/24 hours Place 1 patch (21 mg total) onto the skin daily. Start taking on: July 15, 2022   thiamine 100 MG tablet Commonly known as: Vitamin B-1 Take 1 tablet (100 mg total) by mouth daily. Start taking on: July 15, 2022               Discharge Care Instructions  (From admission, onward)           Start     Ordered   07/14/22 0000  Discharge wound care:        07/14/22 1222            Contact information for follow-up providers     Herby Abraham, MD Follow up in 1 week(s).   Specialty: Cardiology Contact information: [address] [place] [address] [phone]              Contact information for after-discharge care     Destination      [facility] [place] [facility] .   Service: Skilled Nursing Contact information: [address] [place] [address] [phone]                    Discharge Exam: Ceasar Mons Weights   07/09/22 0304 07/12/22 0408 07/13/22 0526  Weight: 101.8 kg 108.1 kg 104 kg   Patient was seen and examined at bedside today. Patient is alert oriented, no distress CVS: S1-S2 positive Respiratory: Bilateral clear and equal breath sounds Abdomen: Soft nondistended nontender bowel sounds are positive  Condition at discharge: fair  The results of significant diagnostics from this hospitalization (including imaging, microbiology, ancillary and laboratory) are listed below for reference.   Imaging Studies: US Abdomen Limited RUQ (LIVER/GB)  Result Date: 07/05/2022 CLINICAL DATA:  Elevated liver function tests. EXAM: ULTRASOUND ABDOMEN LIMITED RIGHT UPPER QUADRANT COMPARISON:  Abdomen and pelvis CT dated 08/15/2007. FINDINGS: Gallbladder: No gallstones or wall thickening visualized. No sonographic Murphy sign noted by sonographer. Common bile duct: Diameter: 3.6 mm Liver: No focal lesion identified. Within normal limits in parenchymal echogenicity. Portal vein is patent on color Doppler imaging with normal direction of blood flow towards the liver. Other: None. IMPRESSION: Normal examination. Electronically Signed   By: Beckie Salts M.D.   On: 07/05/2022 16:15   DG CHEST PORT 1 VIEW  Result Date: 07/04/2022 CLINICAL DATA:  77 year old male with hypoxia. Encephalopathy. Fever. EXAM: PORTABLE CHEST 1 VIEW COMPARISON:  Portable chest 06/30/2022 and earlier. FINDINGS: Portable AP semi upright view at 0623 hours. Enteric feeding tube courses to the abdomen, tip not included. Mildly lower lung volumes. Ongoing patchy nonspecific left lower lung opacity superimposed on cardiomegaly. But the left hemidiaphragm now is visible. Mildly lower lung volumes and increased interstitial  opacity. Trace pleural fluid in the right minor fissure. No pneumothorax. No definite effusion. No acute osseous abnormality identified. IMPRESSION: 1. Lower lung volumes with increased generalized interstitial opacity. Cannot exclude mild or developing interstitial edema. 2. Patchy left lung base opacity although ventilation there appears improved since 06/30/2022. Favor atelectasis over other considerations. Electronically Signed   By: Odessa Fleming M.D.   On: 07/04/2022 09:53   VAS [place] LOWER EXTREMITY VENOUS (DVT)  Result Date: 07/02/2022  Lower Venous DVT Study Patient Name:  CORDARRIUS COAD [facility]  Date of Exam:   07/02/2022 Medical Rec #: [id number]             Accession #:    [id number] Date of Birth: Feb 27, 1946            Patient Gender: M Patient Age:   30 years Exam Location:  [facility] Procedure:      VAS [place] LOWER EXTREMITY VENOUS (DVT) Referring Phys: Karie Fetch --------------------------------------------------------------------------------  Indications: Left lower extremity erythema, pain, mild swelling.  Limitations: Body habitus. Comparison Study: No prior studies. Performing Technologist: Jean Rosenthal RDMS, RVT  Examination Guidelines: A complete evaluation includes B-mode imaging, spectral Doppler, color Doppler, and power Doppler as needed of all accessible portions of each vessel. Bilateral testing is considered an integral part of a complete examination. Limited examinations for reoccurring indications may be performed as noted. The reflux portion of the exam is performed with the patient in reverse Trendelenburg.  +-----+---------------+---------+-----------+----------+--------------+ RIGHTCompressibilityPhasicitySpontaneityPropertiesThrombus Aging +-----+---------------+---------+-----------+----------+--------------+ CFV  Full           Yes      Yes                                 +-----+---------------+---------+-----------+----------+--------------+    +---------+---------------+---------+-----------+----------+--------------+ LEFT     CompressibilityPhasicitySpontaneityPropertiesThrombus Aging +---------+---------------+---------+-----------+----------+--------------+ CFV      Full           Yes      Yes                                 +---------+---------------+---------+-----------+----------+--------------+ SFJ      Full                                                        +---------+---------------+---------+-----------+----------+--------------+ FV Prox  Full                                                        +---------+---------------+---------+-----------+----------+--------------+  FV Mid   Full                                                        +---------+---------------+---------+-----------+----------+--------------+ FV DistalFull                                                        +---------+---------------+---------+-----------+----------+--------------+ PFV      Full                                                        +---------+---------------+---------+-----------+----------+--------------+ POP      Full           Yes      Yes                                 +---------+---------------+---------+-----------+----------+--------------+ PTV      Full                                                        +---------+---------------+---------+-----------+----------+--------------+ PERO     Full                                                        +---------+---------------+---------+-----------+----------+--------------+     Summary: RIGHT: - No evidence of common femoral vein obstruction.  LEFT: - There is no evidence of deep vein thrombosis in the lower extremity.  - No cystic structure found in the popliteal fossa.  *See table(s) above for measurements and observations. Electronically signed by Sherald Hess MD on 07/02/2022 at 2:21:59 PM.    Final    EEG  adult  Result Date: 07/01/2022 Charlsie Quest, MD     07/01/2022  7:58 AM Patient Name: Quention Mcneill MRN: [id number] Epilepsy Attending: Charlsie Quest Referring Physician/Provider: Lynnae January, NP Date: 07/01/2022 Duration: 30.58 mins Patient history: 77yo M with ams. EEG to evaluate for seizure Level of alertness: Awake AEDs during EEG study: VPA Technical aspects: This EEG study was done with scalp electrodes positioned according to the 10-20 International system of electrode placement. Electrical activity was reviewed with band pass filter of 1-70Hz , sensitivity of 7 uV/mm, display speed of 42mm/sec with a  notched filter applied as appropriate. EEG data were recorded continuously and digitally stored.  Video monitoring was available and reviewed as appropriate. Description: The posterior dominant rhythm consists of 8 Hz activity of moderate voltage (25-35 uV) seen predominantly in posterior head regions, symmetric and reactive to eye opening and eye closing. EEG showed intermittent generalized 3 to 6 Hz theta-delta slowing. Physiologic photic driving was not seen  during photic stimulation.  Hyperventilation was not performed.   ABNORMALITY - Intermittent slow, generalized IMPRESSION: This study is suggestive of mild diffuse encephalopathy, nonspecific etiology. No seizures or epileptiform discharges were seen throughout the recording. Priyanka Annabelle Harman   CT ANGIO HEAD NECK W WO CM (CODE STROKE)  Result Date: 06/30/2022 CLINICAL DATA:  Neuro deficit, acute, stroke suspected. Unresponsive. EXAM: CT ANGIOGRAPHY HEAD AND NECK TECHNIQUE: Multidetector CT imaging of the head and neck was performed using the standard protocol during bolus administration of intravenous contrast. Multiplanar CT image reconstructions and MIPs were obtained to evaluate the vascular anatomy. Carotid stenosis measurements (when applicable) are obtained utilizing NASCET criteria, using the distal internal carotid diameter  as the denominator. RADIATION DOSE REDUCTION: This exam was performed according to the departmental dose-optimization program which includes automated exposure control, adjustment of the mA and/or kV according to patient size and/or use of iterative reconstruction technique. CONTRAST:  60mL OMNIPAQUE IOHEXOL 350 MG/ML SOLN COMPARISON:  None Available. FINDINGS: CTA NECK FINDINGS Aortic arch: Standard 3 vessel aortic arch with calcified atherosclerotic plaque. Mixed calcified and soft plaque in the proximal left subclavian artery resulting in less than 50% stenosis. Right carotid system: Patent with prominent calcified plaque at the carotid bifurcation. 65% stenosis of the distal common carotid artery. Less than 50% stenosis of the ICA origin. Left carotid system: Patent with scattered calcified and soft plaque throughout the common carotid artery not resulting in significant stenosis. Heavily calcified plaque in the proximal ICA resulting in 55% stenosis. Vertebral arteries: Patent and codominant. Atherosclerotic plaque at the vertebral origins results in mild-to-moderate stenosis on the right and severe stenosis on the left. Skeleton: Cervical spondylosis with solid bridging vertebral osteophytes at C5-6 and C6-7. Other neck: No evidence of cervical lymphadenopathy or mass. Upper chest: Mild dependent atelectasis and subpleural reticulation in the included upper lobes of the lungs. Partially visualized feeding tube. Review of the MIP images confirms the above findings CTA HEAD FINDINGS Anterior circulation: The internal carotid arteries are patent from skull base to carotid termini with calcified plaque resulting in mild-to-moderate bilateral paraclinoid stenoses. ACAs and MCAs are patent. There are mild right and severe left M1 stenoses, and there is moderate irregularity and attenuation of branch vessels bilaterally. No aneurysm is identified. Posterior circulation: The intracranial vertebral arteries are patent  to the basilar with severe right and mild left V4 segment stenoses. Patent PICA and SCA origins are seen bilaterally. The basilar artery is widely patent. Posterior communicating arteries are diminutive or absent. There are severe bilateral P2 stenoses with asymmetrically severe attenuation of more distal right PCA branch vessels. No aneurysm is identified. Venous sinuses: Patent. Anatomic variants: None. Review of the MIP images confirms the above findings These results were communicated to Dr. Amada Jupiter at 5:18 pm on 06/30/2022 by text page via the [facility] messaging system. IMPRESSION: 1. No emergent large vessel occlusion. 2. Advanced atherosclerosis in the head and neck. 3. Severe left M1, right V4, and bilateral P2 stenoses. 4. 65% distal right common carotid artery stenosis. 5. 55% proximal left ICA stenosis. 6. Severe left and mild-to-moderate right vertebral artery origin stenoses. 7.  Aortic Atherosclerosis (ICD10-I70.0). Electronically Signed   By: Sebastian Ache M.D.   On: 06/30/2022 17:32   CT HEAD CODE STROKE WO CONTRAST`  Result Date: 06/30/2022 CLINICAL DATA:  Code stroke.  Encephalopathy.  Slurred speech. EXAM: CT HEAD WITHOUT CONTRAST TECHNIQUE: Contiguous axial images were obtained from the base of the skull through the vertex without intravenous contrast.  RADIATION DOSE REDUCTION: This exam was performed according to the departmental dose-optimization program which includes automated exposure control, adjustment of the mA and/or kV according to patient size and/or use of iterative reconstruction technique. COMPARISON:  Head CT 06/28/2022 FINDINGS: The study is mildly to moderately motion degraded despite repeat imaging. Brain: No definite acute infarct, intracranial hemorrhage, mass, midline shift, or extra-axial fluid collection is identified, with assessment limited by motion artifact (particularly at the vertex). Chronic infarcts are again noted in the high right frontal lobe and right  cerebellar hemisphere. There is mild cerebral atrophy. Mild hypodensities in the cerebral white matter bilaterally are unchanged and nonspecific but compatible with chronic small vessel ischemic disease. Vascular: Calcified atherosclerosis at the skull base. No hyperdense vessel. Skull: No acute fracture or suspicious osseous lesion. Sinuses/Orbits: Minimal mucosal thickening in the left maxillary sinus. Clear mastoid air cells. Unremarkable orbits. Other: None. ASPECTS [facility] Stroke Program Early CT Score) - Ganglionic level infarction (caudate, lentiform nuclei, internal capsule, insula, M1-M3 cortex): 7 - Supraganglionic infarction (M4-M6 cortex): 3 Total score (0-10 with 10 being normal): 10 IMPRESSION: 1. No evidence of an acute intracranial abnormality within limitations of motion. 2. 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 These results were communicated to Dr. Amada Jupiter at 4:25 pm on 06/30/2022 by text page via the [facility] messaging system. Electronically Signed   By: Sebastian Ache M.D.   On: 06/30/2022 16:25   DG CHEST PORT 1 VIEW  Result Date: 06/30/2022 CLINICAL DATA:  Fever EXAM: PORTABLE CHEST 1 VIEW COMPARISON:  Previous studies including the examination of 06/25/2022 FINDINGS: Transverse diameter of heart is increased. There is improvement in aeration in right lower lung field. Small residual patchy infiltrates are seen in right mid and right lower lung fields. There is increased density in left lower lung field which has not changed. There is blunting of left lateral CP angle. There is no pneumothorax. NG tube is noted traversing the esophagus. IMPRESSION: Cardiomegaly. There are no signs of alveolar pulmonary edema. There is almost complete clearing of infiltrates in right mid and right lower lung fields. Increased density in left lower lung field may suggest pleural effusion and underlying atelectasis/pneumonia. Electronically Signed   By: Ernie Avena M.D.   On:  06/30/2022 08:57   CT HEAD WO CONTRAST ( )  Result Date: 06/28/2022 CLINICAL DATA:  Follow-up stroke. Not moving right side. Mental status changes. EXAM: CT HEAD WITHOUT CONTRAST TECHNIQUE: Contiguous axial images were obtained from the base of the skull through the vertex without intravenous contrast. RADIATION DOSE REDUCTION: This exam was performed according to the departmental dose-optimization program which includes automated exposure control, adjustment of the mA and/or kV according to patient size and/or use of iterative reconstruction technique. COMPARISON:  None Available. FINDINGS: Brain: The study suffers from some motion degradation. There is age related volume loss without subjective lobar predominance. No focal abnormality is seen affecting the brainstem. 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 Cerebral hemispheres show an old right frontal cortical and subcortical infarction and chronic small-vessel ischemic changes of the white matter. No sign of acute infarction, mass lesion, hemorrhage, hydrocephalus or extra-axial collection. Vascular: There is atherosclerotic calcification of the major vessels at the base of the brain. Skull: Negative Sinuses/Orbits: Clear/normal Other: None IMPRESSION: No acute CT finding. 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 Chronic small-vessel ischemic changes of the white matter. Electronically Signed   By: Scherrie Bateman.D.  On: 06/28/2022 14:18   DG Chest Port 1 View  Result Date: 06/25/2022 CLINICAL DATA:  40981 Respiratory failure (HCC) [id number] EXAM: PORTABLE CHEST - 1 VIEW COMPARISON:  06/20/2022 FINDINGS: Cardiac silhouette is prominent. There is pulmonary interstitial prominence with vascular congestion. Bilateral pleural effusions. No pneumothorax. Calcified aorta. Feeding tube extends below the diaphragm and off the x-ray. IMPRESSION: Findings suggest CHF. Electronically  Signed   By: Layla Maw M.D.   On: 06/25/2022 11:22   ECHOCARDIOGRAM COMPLETE  Result Date: 06/24/2022    ECHOCARDIOGRAM REPORT   Patient Name:   TALHA ISER [facility] Date of Exam: 06/24/2022 Medical Rec #:  [id number]            Height:       70.0 in Accession #:    [id number]           Weight:       232.1 lb Date of Birth:  12/07/1945           BSA:          2.224 m Patient Age:    76 years             BP:           137/73 mmHg Patient Gender: M                    HR:           70 bpm. Exam Location:  Inpatient Procedure: 2D Echo, Cardiac Doppler, Color Doppler and Intracardiac            Opacification Agent Indications:    122-I22.9 Subsequent ST elevation (STEM) and non-ST elevation                 (NSTEMI) myocardial infarction  History:        Patient has no prior history of Echocardiogram examinations.                 Previous Myocardial Infarction, COPD, Signs/Symptoms:Chest Pain                 and Altered Mental Status; Risk Factors:Hypertension and Current                 Smoker. ETOH.  Sonographer:    Sheralyn Boatman RDCS Referring Phys: [id number] [facility]  Sonographer Comments: Technically difficult study due to poor echo windows and patient is obese. Image acquisition challenging due to patient body habitus. IMPRESSIONS  1. Left ventricular ejection fraction, by estimation, is 50 to 55%. The left ventricle has low normal function. The left ventricle demonstrates global hypokinesis. There is moderate left ventricular hypertrophy. Left ventricular diastolic parameters are  consistent with Grade II diastolic dysfunction (pseudonormalization).  2. Right ventricular systolic function is moderately reduced. The right ventricular size is moderately enlarged. There is moderately elevated pulmonary artery systolic pressure.  3. The mitral valve is normal in structure. No evidence of mitral valve regurgitation. No evidence of mitral stenosis.  4. The aortic valve is normal in structure. Aortic valve  regurgitation is not visualized. No aortic stenosis is present.  5. There is borderline dilatation of the ascending aorta, measuring 36 mm.  6. The inferior vena cava is dilated in size with <50% respiratory variability, suggesting right atrial pressure of 15 mmHg. FINDINGS  Left Ventricle: Left ventricular ejection fraction, by estimation, is 50 to 55%. The left ventricle has low normal function. The left ventricle demonstrates global hypokinesis. Definity contrast agent was  given IV to delineate the left ventricular endocardial borders. The left ventricular internal cavity size was normal in size. There is moderate left ventricular hypertrophy. Left ventricular diastolic parameters are consistent with Grade II diastolic dysfunction (pseudonormalization). Right Ventricle: The right ventricular size is moderately enlarged. No increase in right ventricular wall thickness. Right ventricular systolic function is moderately reduced. There is moderately elevated pulmonary artery systolic pressure. The tricuspid  regurgitant velocity is 2.85 m/s, and with an assumed right atrial pressure of 15 mmHg, the estimated right ventricular systolic pressure is 47.5 mmHg. Left Atrium: Left atrial size was normal in size. Right Atrium: Right atrial size was normal in size. Pericardium: There is no evidence of pericardial effusion. Mitral Valve: The mitral valve is normal in structure. No evidence of mitral valve regurgitation. No evidence of mitral valve stenosis. Tricuspid Valve: The tricuspid valve is normal in structure. Tricuspid valve regurgitation is mild . No evidence of tricuspid stenosis. Aortic Valve: The aortic valve is normal in structure. Aortic valve regurgitation is not visualized. No aortic stenosis is present. Pulmonic Valve: The pulmonic valve was normal in structure. Pulmonic valve regurgitation is not visualized. No evidence of pulmonic stenosis. Aorta: There is borderline dilatation of the ascending aorta,  measuring 36 mm. Venous: The inferior vena cava is dilated in size with less than 50% respiratory variability, suggesting right atrial pressure of 15 mmHg. IAS/Shunts: No atrial level shunt detected by color flow Doppler.  LEFT VENTRICLE PLAX 2D LVIDd:         5.20 cm      Diastology LVIDs:         3.60 cm      LV e' medial:    5.00 cm/s LV PW:         1.40 cm      LV E/e' medial:  18.5 LV IVS:        1.20 cm      LV e' lateral:   6.42 cm/s LVOT diam:     2.30 cm      LV E/e' lateral: 14.4 LV SV:         66 LV SV Index:   30 LVOT Area:     4.15 cm  LV Volumes (MOD) LV vol d, MOD A2C: 112.0 ml LV vol d, MOD A4C: 115.0 ml LV vol s, MOD A2C: 45.0 ml LV vol s, MOD A4C: 58.3 ml LV SV MOD A2C:     67.0 ml LV SV MOD A4C:     115.0 ml LV SV MOD BP:      63.1 ml RIGHT VENTRICLE            IVC RV S prime:     7.83 cm/s  IVC diam: 2.70 cm TAPSE (M-mode): 1.6 cm LEFT ATRIUM           Index        RIGHT ATRIUM           Index LA diam:      3.60 cm 1.62 cm/m   RA Area:     16.40 cm LA Vol (A2C): 58.3 ml 26.22 ml/m  RA Volume:   36.50 ml  16.41 ml/m LA Vol (A4C): 53.7 ml 24.15 ml/m  AORTIC VALVE LVOT Vmax:   86.25 cm/s LVOT Vmean:  54.800 cm/s LVOT VTI:    0.159 m  AORTA Ao Root diam: 3.30 cm Ao Asc diam:  3.60 cm MITRAL VALVE  TRICUSPID VALVE MV Area (PHT): 3.85 cm    TR Peak grad:   32.5 mmHg MV Decel Time: 197 msec    TR Vmax:        285.00 cm/s MV E velocity: 92.70 cm/s MV A velocity: 85.60 cm/s  SHUNTS MV E/A ratio:  1.08        Systemic VTI:  0.16 m                            Systemic Diam: 2.30 cm Lavona Mound Tobb DO Electronically signed by Thomasene Ripple DO Signature Date/Time: 06/24/2022/4:39:30 PM    Final    DG Abd Portable 1V  Result Date: 06/24/2022 CLINICAL DATA:  [id number] Encounter for feeding tube placement [id number] EXAM: PORTABLE ABDOMEN - 1 VIEW COMPARISON:  None Available. FINDINGS: Feeding tube tip overlies the stomach. Nonobstructive upper abdominal bowel gas pattern. IMPRESSION: Feeding tube tip  overlies the stomach. Electronically Signed   By: Caprice Renshaw M.D.   On: 06/24/2022 12:18   DG Chest 2 View  Result Date: 06/20/2022 CLINICAL DATA:  Chest pain EXAM: CHEST - 2 VIEW COMPARISON:  09/18/2014 FINDINGS: Mild cardiac enlargement. No vascular congestion or edema. Mild emphysematous changes in the lungs with peribronchial thickening and scattered interstitial changes, likely chronic bronchitis. Calcified granulomas on the left. No focal consolidation. No pleural effusions. No pneumothorax. Mediastinal contours appear intact. Degenerative changes in the spine and shoulders. IMPRESSION: Emphysematous and chronic bronchitic changes in the lungs. Scattered calcified granulomas. No focal consolidation. Electronically Signed   By: Burman Nieves M.D.   On: 06/20/2022 03:58    Microbiology: Results for orders placed or performed during the hospital encounter of 06/20/22  Resp panel by RT-PCR (RSV, Flu A&B, Covid) Anterior Nasal Swab     Status: None   Collection Time: 06/20/22  2:47 AM   Specimen: Anterior Nasal Swab  Result Value Ref Range Status   SARS Coronavirus 2 by RT PCR NEGATIVE NEGATIVE Final   Influenza A by PCR NEGATIVE NEGATIVE Final   Influenza B by PCR NEGATIVE NEGATIVE Final    Comment: (NOTE) The Xpert Xpress SARS-CoV-2/FLU/RSV plus assay is intended as an aid in the diagnosis of influenza from Nasopharyngeal swab specimens and should not be used as a sole basis for treatment. Nasal washings and aspirates are unacceptable for Xpert Xpress SARS-CoV-2/FLU/RSV testing.  Fact Sheet for Patients: BloggerCourse.com  Fact Sheet for Healthcare Providers: SeriousBroker.it  This test is not yet approved or cleared by the [place] FDA and has been authorized for detection and/or diagnosis of SARS-CoV-2 by FDA under an Emergency Use Authorization (EUA). This EUA will remain in effect (meaning this test can be used) for the  duration of the COVID-19 declaration under Section 564(b)(1) of the Act, 21 U.S.C. section 360bbb-3(b)(1), unless the authorization is terminated or revoked.     Resp Syncytial Virus by PCR NEGATIVE NEGATIVE Final    Comment: (NOTE) Fact Sheet for Patients: BloggerCourse.com  Fact Sheet for Healthcare Providers: SeriousBroker.it  This test is not yet approved or cleared by the [place] FDA and has been authorized for detection and/or diagnosis of SARS-CoV-2 by FDA under an Emergency Use Authorization (EUA). This EUA will remain in effect (meaning this test can be used) for the duration of the COVID-19 declaration under Section 564(b)(1) of the Act, 21 U.S.C. section 360bbb-3(b)(1), unless the authorization is terminated or revoked.  Performed at [facility], [address],  [place], [place] [address]   MRSA Next Gen by PCR, Nasal     Status: None   Collection Time: 06/21/22  5:10 PM   Specimen: Nasal Mucosa; Nasal Swab  Result Value Ref Range Status   MRSA by PCR Next Gen NOT DETECTED NOT DETECTED Final    Comment: (NOTE) The GeneXpert MRSA Assay (FDA approved for NASAL specimens only), is one component of a comprehensive MRSA colonization surveillance program. It is not intended to diagnose MRSA infection nor to guide or monitor treatment for MRSA infections. Test performance is not FDA approved in patients less than 90 years old. Performed at [facility], [address] [place], [place] [address]   MRSA Next Gen by PCR, Nasal     Status: None   Collection Time: 06/30/22 11:30 AM   Specimen: Nasal Mucosa; Nasal Swab  Result Value Ref Range Status   MRSA by PCR Next Gen NOT DETECTED NOT DETECTED Final    Comment: (NOTE) The GeneXpert MRSA Assay (FDA approved for NASAL specimens only), is one component of a comprehensive MRSA colonization surveillance program. It is not intended to diagnose MRSA  infection nor to guide or monitor treatment for MRSA infections. Test performance is not FDA approved in patients less than 100 years old. Performed at [facility], [address], [place], [place] [address]     Labs: CBC: Recent Labs  Lab 07/08/22 0617 07/10/22 0557 07/12/22 0531  WBC 15.9* 14.1* 13.1*  HGB 11.3* 11.8* 11.7*  HCT 34.3* 34.5* 35.5*  MCV 104.9* 102.7* 102.6*  PLT 416* 490* 530*   Basic Metabolic Panel: Recent Labs  Lab 07/08/22 0617 07/10/22 0557 07/12/22 0531 07/14/22 0635  NA 136 136 138  --   K 4.2 4.0 4.0  --   CL 101 98 100  --   CO2 27 25 25   --   GLUCOSE 106* 92 119*  --   BUN 33* 29* 20  --   CREATININE 0.90 0.93 0.93  --   CALCIUM 8.4* 8.5* 8.4*  --   MG 2.2 1.9 1.9 1.9  PHOS 3.8 3.8 3.5 3.5   Liver Function Tests: Recent Labs  Lab 07/08/22 0617 07/10/22 0557 07/12/22 0531  AST 111* 131* 109*  ALT 105* 130* 141*  ALKPHOS 87 90 99  BILITOT 0.7 0.7 0.8  PROT 6.5 6.6 6.6  ALBUMIN 2.0* 2.1* 2.1*   CBG: Recent Labs  Lab 07/09/22 1155 07/09/22 1624 07/09/22 2009 07/10/22 0209 07/10/22 0457  GLUCAP 95 100* 99 103* 99    Discharge time spent: greater than 30 minutes.  Signed: Harold Hedge, MD [facility] 07/14/2022

## 2022-07-14 NOTE — Plan of Care (Signed)
  Problem: Education: Goal: Knowledge of General Education information will improve Description: Including pain rating scale, medication(s)/side effects and non-pharmacologic comfort measures Outcome: Progressing   Problem: Health Behavior/Discharge Planning: Goal: Ability to manage health-related needs will improve Outcome: Progressing   Problem: Activity: Goal: Ability to return to baseline activity level will improve Outcome: Progressing   Problem: Education: Goal: Understanding of discharge needs will improve Outcome: Progressing   Problem: Health Behavior/Discharge Planning: Goal: Ability to identify changes in lifestyle to reduce recurrence of condition will improve Outcome: Progressing   Problem: Safety: Goal: Non-violent Restraint(s) Outcome: Completed/Met

## 2022-07-14 NOTE — Consult Note (Signed)
WOC Nurse wound follow up Wound type: pressure injuries  Measurement: Nose; Unstageable: 1.5cm x 0.4cm x 0cm; crusted/scabbed 100% Left heel; Unstageable: 4cm x 5cmx 0cm; 100% eschar; macerated at wound edges Right heel; Unstageable; 5cm x 6cm x 0cm; 100% eschar  Left lateral leg: Unstageable; 4cm x 2.0cm x 0.1cm; 100% pink Right posterior thigh: Deep Tissue Pressure; unable to measure today; 100% pink Wound bed:see above  Drainage (amount, consistency, odor) scant at all wounds, non purulent Periwound: intact; maceration at each heel; will address this with updated wound care orders  Dressing procedure/placement/frequency: Important to offload heels with Prevalon boots at all times, pillows will not offload as needed. No Prevalon boots in the room within vision at the time of my assessment; [place] to staff to request follow on on this. Patient needs them to go with him to SNF Paint both heels with betadine, cover left heel with calcium alginate to absorb moisture. Cover with foam. Daily Vaseline to the bridge of the nose daily, no dressing Single layer of xeroform to the left lateral leg and the right posterior thigh, top both with foam, daily   WOC Nurse team will follow with you and see patient within 10 days for wound assessments.  Please notify WOC nurses of any acute changes in the wounds or any new areas of concern Davon Folta [facility] MSN, RN,CWOCN, CNS, CWON-AP [phone]

## 2022-07-14 NOTE — TOC Transition Note (Signed)
Transition of Care [facility]) - CM/SW Discharge Note   Patient Details  Name: [name] Oneal MRN: [id number] Date of Birth: [birth year]  [facility] CM/SW Contact:  Delilah Shan, LCSWA Phone Number: 07/14/2022, 1:37 PM   Clinical Narrative:     Patient will DC to: [facility] Rehab  Anticipated DC date: 07/14/2022  Family notified: Tammi   Transport by: Sharin Mons  ?  Per MD patient ready for DC to [facility] . RN, patient, patient's family, and facility notified of DC. Discharge Summary sent to facility. RN given number for report tele# [phone] RM# 311. DC packet on chart. DNR signed by MD attached to patients DC packet.Ambulance transport requested for patient.  CSW signing off.   Final next level of care: Skilled Nursing Facility Barriers to Discharge: No Barriers Identified   Patient Goals and CMS Choice CMS Medicare.gov Compare Post Acute Care list provided to:: Patient Represenative (must comment) (Patients daughter) Choice offered to / list presented to : Adult Children (Patients daughter)  Discharge Placement                Patient chooses bed at: [facility] Patient to be transferred to facility by: [facility] Name of family member notified: Tammi Patient and family notified of of transfer: 07/14/22  Discharge Plan and Services Additional resources added to the After Visit Summary for   In-house Referral: Clinical Social Work Discharge Planning Services: CM Consult                                 Social Determinants of Health (SDOH) Interventions SDOH Screenings   Food Insecurity: No Food Insecurity (06/20/2022)  Housing: Low Risk  (06/20/2022)  Transportation Needs: No Transportation Needs (06/20/2022)  Utilities: Not At Risk (06/20/2022)  Tobacco Use: High Risk (06/20/2022)     Readmission Risk Interventions     No data to display

## 2022-07-14 NOTE — TOC Progression Note (Signed)
Transition of Care [facility]) - Progression Note    Patient Details  Name: [name] Oneal MRN: [id number] Date of Birth: 1945-10-15  Transition of Care [facility]) CM/SW Contact  Delilah Shan, LCSWA Phone Number: 07/14/2022, 11:44 AM  Clinical Narrative:     Patient has SNF bed at [facility] when medically ready. Insurance authorization has been approved through 4/17. CSW will continue to follow and assist with patients dc planning needs.  Expected Discharge Plan: Skilled Nursing Facility Barriers to Discharge: Continued Medical Work up  Expected Discharge Plan and Services In-house Referral: Clinical Social Work Discharge Planning Services: CM Consult   Living arrangements for the past 2 months: Single Family Home                                       Social Determinants of Health (SDOH) Interventions SDOH Screenings   Food Insecurity: No Food Insecurity (06/20/2022)  Housing: Low Risk  (06/20/2022)  Transportation Needs: No Transportation Needs (06/20/2022)  Utilities: Not At Risk (06/20/2022)  Tobacco Use: High Risk (06/20/2022)    Readmission Risk Interventions     No data to display

## 2022-07-24 ENCOUNTER — Other Ambulatory Visit: Payer: Self-pay

## 2022-07-24 ENCOUNTER — Emergency Department (HOSPITAL_COMMUNITY): Payer: Medicare PPO

## 2022-07-24 ENCOUNTER — Emergency Department (HOSPITAL_COMMUNITY)
Admission: EM | Admit: 2022-07-24 | Discharge: 2022-07-25 | Disposition: A | Discharge status: Skilled Nursing Facility | Payer: Medicare PPO | Attending: Emergency Medicine | Admitting: Emergency Medicine

## 2022-07-24 DIAGNOSIS — Z7982 Long term (current) use of aspirin: Secondary | ICD-10-CM | POA: Diagnosis not present

## 2022-07-24 DIAGNOSIS — Z79899 Other long term (current) drug therapy: Secondary | ICD-10-CM | POA: Diagnosis not present

## 2022-07-24 DIAGNOSIS — Z794 Long term (current) use of insulin: Secondary | ICD-10-CM | POA: Insufficient documentation

## 2022-07-24 DIAGNOSIS — S91201A Unspecified open wound of right great toe with damage to nail, initial encounter: Secondary | ICD-10-CM | POA: Insufficient documentation

## 2022-07-24 DIAGNOSIS — I10 Essential (primary) hypertension: Secondary | ICD-10-CM | POA: Diagnosis not present

## 2022-07-24 DIAGNOSIS — G8929 Other chronic pain: Secondary | ICD-10-CM | POA: Insufficient documentation

## 2022-07-24 DIAGNOSIS — M79671 Pain in right foot: Secondary | ICD-10-CM | POA: Insufficient documentation

## 2022-07-24 DIAGNOSIS — J449 Chronic obstructive pulmonary disease, unspecified: Secondary | ICD-10-CM | POA: Insufficient documentation

## 2022-07-24 DIAGNOSIS — E119 Type 2 diabetes mellitus without complications: Secondary | ICD-10-CM | POA: Diagnosis not present

## 2022-07-24 DIAGNOSIS — L8990 Pressure ulcer of unspecified site, unspecified stage: Secondary | ICD-10-CM | POA: Diagnosis not present

## 2022-07-24 DIAGNOSIS — S99921A Unspecified injury of right foot, initial encounter: Secondary | ICD-10-CM | POA: Diagnosis present

## 2022-07-24 DIAGNOSIS — D72829 Elevated white blood cell count, unspecified: Secondary | ICD-10-CM | POA: Diagnosis not present

## 2022-07-24 DIAGNOSIS — W01198A Fall on same level from slipping, tripping and stumbling with subsequent striking against other object, initial encounter: Secondary | ICD-10-CM | POA: Insufficient documentation

## 2022-07-24 DIAGNOSIS — R944 Abnormal results of kidney function studies: Secondary | ICD-10-CM | POA: Diagnosis not present

## 2022-07-24 LAB — CBC WITH DIFFERENTIAL/PLATELET
Abs Immature Granulocytes: 0.09 10*3/uL — ABNORMAL HIGH (ref 0.00–0.07)
Basophils Absolute: 0 10*3/uL (ref 0.0–0.1)
Basophils Relative: 0 %
Eosinophils Absolute: 0.2 10*3/uL (ref 0.0–0.5)
Eosinophils Relative: 1 %
HCT: 36.6 % — ABNORMAL LOW (ref 39.0–52.0)
Hemoglobin: 12.2 g/dL — ABNORMAL LOW (ref 13.0–17.0)
Immature Granulocytes: 1 %
Lymphocytes Relative: 12 %
Lymphs Abs: 1.8 10*3/uL (ref 0.7–4.0)
MCH: 33.8 pg (ref 26.0–34.0)
MCHC: 33.3 g/dL (ref 30.0–36.0)
MCV: 101.4 fL — ABNORMAL HIGH (ref 80.0–100.0)
Monocytes Absolute: 1.1 10*3/uL — ABNORMAL HIGH (ref 0.1–1.0)
Monocytes Relative: 8 %
Neutro Abs: 11.5 10*3/uL — ABNORMAL HIGH (ref 1.7–7.7)
Neutrophils Relative %: 78 %
Platelets: 241 10*3/uL (ref 150–400)
RBC: 3.61 MIL/uL — ABNORMAL LOW (ref 4.22–5.81)
RDW: 13.9 % (ref 11.5–15.5)
WBC: 14.7 10*3/uL — ABNORMAL HIGH (ref 4.0–10.5)
nRBC: 0 % (ref 0.0–0.2)

## 2022-07-24 LAB — COMPREHENSIVE METABOLIC PANEL
ALT: 25 U/L (ref 0–44)
AST: 24 U/L (ref 15–41)
Albumin: 2.7 g/dL — ABNORMAL LOW (ref 3.5–5.0)
Alkaline Phosphatase: 74 U/L (ref 38–126)
Anion gap: 15 (ref 5–15)
BUN: 26 mg/dL — ABNORMAL HIGH (ref 8–23)
CO2: 25 mmol/L (ref 22–32)
Calcium: 8.9 mg/dL (ref 8.9–10.3)
Chloride: 98 mmol/L (ref 98–111)
Creatinine, Ser: 1.52 mg/dL — ABNORMAL HIGH (ref 0.61–1.24)
GFR, Estimated: 47 mL/min — ABNORMAL LOW (ref >60)
Glucose, Bld: 101 mg/dL — ABNORMAL HIGH (ref 70–99)
Potassium: 3.7 mmol/L (ref 3.5–5.1)
Sodium: 138 mmol/L (ref 135–145)
Total Bilirubin: 1.2 mg/dL (ref 0.3–1.2)
Total Protein: 6.9 g/dL (ref 6.5–8.1)

## 2022-07-24 MED ORDER — ASPIRIN 81 MG PO CHEW: 81.0000 mg | CHEWABLE_TABLET | Freq: Every day | ORAL | Status: DC

## 2022-07-24 MED ORDER — LISINOPRIL 20 MG PO TABS: 20.0000 mg | ORAL_TABLET | Freq: Every day | ORAL | Status: DC

## 2022-07-24 MED ORDER — VITAMIN B-12 100 MCG PO TABS: 500.0000 ug | ORAL_TABLET | Freq: Every day | ORAL | Status: DC

## 2022-07-24 MED ORDER — POLYETHYLENE GLYCOL 3350 17 G PO PACK: 17.0000 g | PACK | Freq: Every day | ORAL | 0 refills | Status: DC

## 2022-07-24 MED ORDER — GABAPENTIN 300 MG PO CAPS
300.0000 mg | ORAL_CAPSULE | Freq: Once | ORAL | Status: AC
  Administered 2022-07-24: 300 mg via ORAL
  Filled 2022-07-24: qty 1

## 2022-07-24 MED ORDER — NICOTINE 21 MG/24HR TD PT24: 21.0000 mg | MEDICATED_PATCH | Freq: Every day | TRANSDERMAL | Status: DC

## 2022-07-24 MED ORDER — FOLIC ACID 1 MG PO TABS: 1.0000 mg | ORAL_TABLET | Freq: Every day | ORAL | Status: DC

## 2022-07-24 MED ORDER — HYDROCODONE-ACETAMINOPHEN 5-325 MG PO TABS: 2.0000 | ORAL_TABLET | ORAL | 0 refills | Status: DC | PRN

## 2022-07-24 MED ORDER — THIAMINE MONONITRATE 100 MG PO TABS: 100.0000 mg | ORAL_TABLET | Freq: Every day | ORAL | Status: DC

## 2022-07-24 MED ORDER — FENTANYL CITRATE PF 50 MCG/ML IJ SOSY
50.0000 ug | PREFILLED_SYRINGE | Freq: Once | INTRAMUSCULAR | Status: AC
  Administered 2022-07-24: 50 ug via INTRAVENOUS
  Filled 2022-07-24: qty 1

## 2022-07-24 MED ORDER — ATORVASTATIN CALCIUM 40 MG PO TABS: 80.0000 mg | ORAL_TABLET | Freq: Every day | ORAL | Status: DC

## 2022-07-24 MED ORDER — LACTATED RINGERS IV BOLUS
500.0000 mL | Freq: Once | INTRAVENOUS | Status: AC
  Administered 2022-07-24: 500 mL via INTRAVENOUS

## 2022-07-24 MED ORDER — INSULIN GLARGINE-YFGN 100 UNIT/ML ~~LOC~~ SOLN
8.0000 [IU] | Freq: Every day | SUBCUTANEOUS | Status: DC
  Filled 2022-07-24: qty 0.08

## 2022-07-24 MED ORDER — GABAPENTIN 300 MG PO CAPS
300.0000 mg | ORAL_CAPSULE | Freq: Two times a day (BID) | ORAL | Status: DC
  Administered 2022-07-24: 300 mg via ORAL
  Filled 2022-07-24: qty 1

## 2022-07-24 MED ORDER — METOPROLOL TARTRATE 25 MG PO TABS
25.0000 mg | ORAL_TABLET | Freq: Two times a day (BID) | ORAL | Status: DC
  Administered 2022-07-24: 25 mg via ORAL
  Filled 2022-07-24: qty 1

## 2022-07-24 NOTE — Discharge Instructions (Addendum)
[name] Oneal  Thank you for allowing [place] to take care of you today.  You came to the Emergency Department today because you have had pain in your bilateral feet, it appears that you have pressure ulcers to your bilateral heels, this is where you have been laying down and the bed has been pushing against her heels and it causes death of the tissue.  This can cause significant pain, we did x-rays and you do not have any evidence of any infection on the x-rays, you have a mild elevation of your white blood cell count which is your infection/inflammation cell count, however over the comparison of the past several months it looks like it is always mildly elevated, therefore this is not a new change we do not think it reflects infection today.  We also did a CT of your head and neck given you had a fall at your facility.  This also did not show any traumatic injuries.  Recommend continuing to offload the wounds at your facility and wound care, we will give you a prescription for Norco that you can take every 6 hours as needed for severe pain over the next 5 days, you should also take MiraLAX daily while you are taking Norco as it can make you constipated.  You should follow-up with wound care.  Your kidney function was mildly elevated today, we gave you some fluids, you should have your kidney numbers rechecked in approximately a week and increase your fluid intake over the next several days.   To-Do: 1. Please follow-up with your primary doctor within 2 days / as soon as possible.   Please return to the Emergency Department or call 911 if you experience have worsening of your symptoms, or do not get better, chest pain, shortness of breath, severe or significantly worsening pain, high fever, severe confusion, pass out or have any reason to think that you need emergency medical care.   We hope you feel better soon.   Curley Spice, MD Department of Emergency Medicine [facility]

## 2022-07-24 NOTE — ED Provider Notes (Incomplete)
[place] EMERGENCY DEPARTMENT AT [facility] Provider Note  Medical Decision Making   HPI: [name] Oneal is a 77 y.o. 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 Patient arrived via {LSARRIVAL:29392} *** History provided by {LSHISTORY:52348}. {LSINTERPRETER:52349}  ***  ROS: As per HPI. Please see MAR for complete past medical history, surgical history, and social history.   Physical exam is pertinent for ***.   The differential includes but is not limited to ***.  Additional history obtained from: Chart review External records from outside source obtained and reviewed including: Reviewed patient's recent discharge summary from 4/16, patient was admitted for NSTEMI, however had course complicated by alcohol withdrawal, encephalopathy, ICU stay, developed pressure ulcers to bilateral heels per progress note from 4/15  ED provider interpretation of ECG: Rate 93, sinus rhythm, RBBB, T wave inversions in anterior lateral leads, no significant change from prior  ED provider interpretation of radiology/imaging: Personally reviewed plain films of the bilateral feet, do not appreciate fracture, dislocation, free air, or overt osseous erosions.  CT C-spine without fracture or dislocation, CT head without ICH, displaced fracture, or loss of gray/white matter differentiation.  Labs ordered were interpreted by myself as well as my attending and were incorporated into the medical decision making process for this patient.  ED provider interpretation of labs: CMP with mild elevation of creatinine to 1.5 from baseline of approximately 0.9-1, no emergent electrolyte derangement.  CMP with persistent leukocytosis to 14.7, patient has had persistent leukocytosis since early March 2023, no significant change from baseline.  Stable anemia.  No thrombocytosis or thrombocytopenia.  Interventions:  Gabapentin, fentanyl, LR  See the EMR for full details regarding lab and imaging results.  On initial evaluation, patient is overall vitally stable, well-appearing, is only uncomfortable if the posterior aspect of his bilateral heels are manipulated.     Consults: ***  Disposition: {LSDISPO:29394}  The plan for this patient was discussed with Dr. ***, who voiced agreement and who oversaw evaluation and treatment of this patient.  Clinical Impression: No diagnosis found. Data Unavailable  Therapies: These medications and interventions were provided for the patient while in the ED. Medications - No data to display  MDM generated using voice dictation software and may contain dictation errors.  Please contact me for any clarification or with any questions.  Clinical Complexity A medically appropriate history, review of systems, and physical exam was performed.  Collateral history obtained from: *** I personally reviewed the labs, EKG, imaging as*** discussed above. Patient's presentation is most consistent with {LSEMCOPA:29396} ***Considered and ruled out life and body threatening conditions  Treatment: {LSTREATMENT:51051} Patient's {EMSDOH:[id number]} increases the complexity of managing their presentation. Medications: {LSDRUGS:51050} Discussed patient's care with providers from the following different specialties: ***  Physical Exam   ED Triage Vitals  Enc Vitals Group     BP 07/24/22 1949 (!) 140/83     Pulse --      Resp 07/24/22 1949 19     Temp 07/24/22 1949 98 F (36.7 C)     Temp Source 07/24/22 1949 Oral     SpO2 07/24/22 1949 95 %     Weight 07/24/22 1953 229 lb 4.5 oz (104 kg)     Height 07/24/22 1953 5\' 10"  (1.778 m)     Head Circumference --      Peak Flow --      Pain Score 07/24/22 1952 10  Pain Loc --      Pain Edu? --      Excl. in GC? --      Physical Exam Vitals and nursing note reviewed.  Constitutional:      General: He is not in acute  distress.    Appearance: He is well-developed. He is obese.  HENT:     Head: Normocephalic and atraumatic.  Eyes:     Conjunctiva/sclera: Conjunctivae normal.  Cardiovascular:     Rate and Rhythm: Normal rate and regular rhythm.     Heart sounds: No murmur heard. Pulmonary:     Effort: Pulmonary effort is normal. No respiratory distress.     Breath sounds: Normal breath sounds.  Abdominal:     Palpations: Abdomen is soft.     Tenderness: There is no abdominal tenderness.  Musculoskeletal:        General: No swelling.     Cervical back: Neck supple.  Skin:    General: Skin is warm and dry.     Capillary Refill: Capillary refill takes less than 2 seconds.  Neurological:     Mental Status: He is alert.  Psychiatric:        Mood and Affect: Mood normal.            Procedure Note  Procedures  No orders to display    Julianne Rice, MD Emergency Medicine, PGY-2  {Document cardiac monitor, telemetry assessment procedure when appropriate:1}   {   Click here for ABCD2, HEART and other calculatorsREFRESH Note before signing :1}    {Document critical care time when appropriate:1} {Document review of labs and clinical decision tools ie heart score, Chads2Vasc2 etc:1}  {Document your independent review of radiology images, and any outside records:1} {Document your discussion with family members, caretakers, and with consultants:1} {Document social determinants of health affecting pt's care:1} {Document your decision making why or why not admission, treatments were needed:1}

## 2022-07-24 NOTE — ED Notes (Signed)
Ptar called, no eta 

## 2022-07-24 NOTE — ED Provider Notes (Signed)
EMERGENCY DEPARTMENT AT [facility] Provider Note  Medical Decision Making   HPI: [name] Oneal is a 77 y.o. male with history perinent for HTN, alcohol use disorder, COPD not on home oxygen, prior NSTEMI, diabetes who presents complaining of foot pain. Patient arrived via EMS from facility.  History provided by patient, EMS.  No interpreter required for this encounter.  EMS reports that patient was recently admitted and discharged from the hospital, has been at his facility for approximately 1 week, reports that patient has been complaining of increased pain in bilateral feet, also had a fall at his facility when he attempted to ambulate.  Patient reports that he has had chronic pain in his bilateral heels, states that it feels like needles are being driven into his heels, does not report worsening condition today.  Denies fever, chills, nausea, vomiting, diarrhea, abdominal pain, chest pain, shortness of breath.  Reports that he did have a fall when he attempted to stand today, also reports that a staff member at his facility accidentally hit his great toe with a cart, and he is also having increased pain, right great toe.  ROS: As per HPI. Please see MAR for complete past medical history, surgical history, and social history.   Physical exam is pertinent for schedule pressure wounds to bilateral heels as well as eschar to right great toe.   The differential includes but is not limited to pressure wounds, osteomyelitis, cellulitis, abscess, ICH, spinal fracture.  Additional history obtained from: Chart review External records from outside source obtained and reviewed including: Reviewed patient's recent discharge summary from 4/16, patient was admitted for NSTEMI, however had course complicated by alcohol withdrawal, encephalopathy, ICU stay, developed pressure ulcers to bilateral heels per progress note from 4/15  ED provider interpretation of ECG: Rate 93, sinus  rhythm, RBBB, T wave inversions in anterior lateral leads, no significant change from prior  ED provider interpretation of radiology/imaging: Personally reviewed plain films of the bilateral feet, do not appreciate fracture, dislocation, free air, or overt osseous erosions.  CT C-spine without fracture or dislocation, CT head without ICH, displaced fracture, or loss of gray/white matter differentiation.  Labs ordered were interpreted by myself as well as my attending and were incorporated into the medical decision making process for this patient.  ED provider interpretation of labs: CMP with mild elevation of creatinine to 1.5 from baseline of approximately 0.9-1, no emergent electrolyte derangement.  CMP with persistent leukocytosis to 14.7, patient has had persistent leukocytosis since early March 2023, no significant change from baseline.  Stable anemia.  No thrombocytosis or thrombocytopenia.  Interventions: Gabapentin, fentanyl, LR  See the EMR for full details regarding lab and imaging results.  On initial evaluation, patient is overall vitally stable, well-appearing, is only uncomfortable if the posterior aspect of his bilateral heels are manipulated.  Reports fall, does not report any traumatic injuries from fall, patient does warrant CT head and CT C-spine per [place] CT head and CT C-spine.  Patient does not take any anticoagulation per his report for per chart review.  With regards to his feet, patient of has chronic appearing wounds to the bilateral heels, as well as reportedly new wound to his right great toe.  Patient does not have pain on the remainder of his feet when they are manipulated, only on his bilateral heels.  Overall he does not have any tracking erythema, any induration, purulent drainage, lower concern for abscess or cellulitis.  Will obtain x-rays to  evaluate for overt signs of osteomyelitis as well as labs.  Overall x-rays do not reveal overt evidence of infectious etiology.   CBC does demonstrate leukocytosis, however overall stable, therefore do not feel that patient presentation is consistent with developing infection.  Otherwise CMP does demonstrate mildly elevated creatinine.  LR bolus provided, recommended increased oral hydration as well as follow-up with PCP for creatinine recheck.  Overall discussed with patient and daughter at bedside that patient's wounds are most consistent with pressure wounds, discussed wound care, offloading, and pain medication.  Patient will be discharged back to rehab facility, PTR transport patient back to facility, medications and diet ordered while patient is awaiting transport.  No additional acute events while patient was under my care in the ED.     Consults: Not indicated  Disposition: DISCHARGE: I believe that the patient is safe for discharge home with outpatient follow-up. Patient was informed of all pertinent physical exam, laboratory, and imaging findings. Patient's suspected etiology of their symptom presentation was discussed with the patient and all questions were answered. We discussed following up with PCP. I provided thorough ED return precautions. The patient feels safe and comfortable with this plan.  The plan for this patient was discussed with Dr. Anitra Lauth, who voiced agreement and who oversaw evaluation and treatment of this patient.  Clinical Impression:  1. 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 Medications  aspirin chewable tablet 81 mg (has no administration in time range)  atorvastatin (LIPITOR) tablet 80 mg (has no administration in time range)  folic acid (FOLVITE) tablet 1 mg (has no administration in time range)  gabapentin (NEURONTIN) capsule 300 mg (300 mg Oral Given 07/24/22 2331)  insulin glargine-yfgn (SEMGLEE) injection 8 Units (has no administration in time range)   lisinopril (ZESTRIL) tablet 20 mg (has no administration in time range)  metoprolol tartrate (LOPRESSOR) tablet 25 mg (25 mg Oral Given 07/24/22 2331)  nicotine (NICODERM CQ - dosed in mg/24 hours) patch 21 mg (has no administration in time range)  thiamine (VITAMIN B1) tablet 100 mg (has no administration in time range)  vitamin B-12 (CYANOCOBALAMIN) tablet 500 mcg (has no administration in time range)  gabapentin (NEURONTIN) capsule 300 mg (300 mg Oral Given 07/24/22 2016)  fentaNYL (SUBLIMAZE) injection 50 mcg (50 mcg Intravenous Given 07/24/22 2017)  lactated ringers bolus 500 mL (0 mLs Intravenous Stopped 07/24/22 2216)    MDM generated using voice dictation software and may contain dictation errors.  Please contact me for any clarification or with any questions.  Clinical Complexity A medically appropriate history, review of systems, and physical exam was performed.  Collateral history obtained from: Chart review, EMS, daughter at bedside I personally reviewed the labs, EKG, imaging as discussed above. Patient's presentation is most consistent with acute complicated illness / injury requiring diagnostic workup Considered and ruled out life and body threatening conditions  Treatment: Outpatient follow-up Medications: Parenteral controlled substances, prescription Discussed patient's care with providers from the following different specialties: None  Physical Exam   ED Triage Vitals  Enc Vitals Group     BP 07/24/22 1949 (!) 140/83     Pulse --      Resp 07/24/22 1949 19     Temp 07/24/22 1949 98 F (36.7 C)     Temp Source 07/24/22 1949 Oral     SpO2 07/24/22 1949 95 %  Weight 07/24/22 1953 229 lb 4.5 oz (104 kg)     Height 07/24/22 1953 5\' 10"  (1.778 m)     Head Circumference --      Peak Flow --      Pain Score 07/24/22 1952 10     Pain Loc --      Pain Edu? --      Excl. in GC? --      Physical Exam Vitals and nursing note reviewed.  Constitutional:       General: He is not in acute distress.    Appearance: He is well-developed. He is obese.  HENT:     Head: Normocephalic and atraumatic.  Eyes:     Conjunctiva/sclera: Conjunctivae normal.  Cardiovascular:     Rate and Rhythm: Normal rate and regular rhythm.     Heart sounds: No murmur heard. Pulmonary:     Effort: Pulmonary effort is normal. No respiratory distress.     Breath sounds: Normal breath sounds.  Abdominal:     Palpations: Abdomen is soft.     Tenderness: There is no abdominal tenderness.  Musculoskeletal:        General: No swelling.     Cervical back: Neck supple.     Comments: Clavicle stable to compression, chest wall stable to AP and lateral compression, nontender, pelvis stable to AP and lateral compressions, bilateral upper extremities appear atraumatic, bilateral lower extremities with pressure wounds to bilateral heels, wound to dorsum of right great toe.  Otherwise lower extremities.  Traumatic.  See images.  Skin:    General: Skin is warm and dry.     Capillary Refill: Capillary refill takes less than 2 seconds.  Neurological:     General: No focal deficit present.     Mental Status: He is alert.  Psychiatric:        Mood and Affect: Mood normal.            Procedure Note  Procedures  DG Foot Complete Left  Final Result    DG Foot Complete Right  Final Result    CT Cervical Spine Wo Contrast  Final Result    CT Head Wo Contrast  Final Result      Julianne Rice, MD Emergency Medicine, PGY-2   Curley Spice, MD 07/25/22 1610    Gwyneth Sprout, MD 07/25/22 1610

## 2022-07-25 ENCOUNTER — Other Ambulatory Visit: Payer: Self-pay

## 2022-07-25 ENCOUNTER — Emergency Department (HOSPITAL_COMMUNITY)
Admission: EM | Admit: 2022-07-25 | Discharge: 2022-07-25 | Disposition: A | Discharge status: Other Acute Inpatient Hospital | Payer: Medicare PPO | Source: Home / Self Care | Attending: Emergency Medicine | Admitting: Emergency Medicine

## 2022-07-25 DIAGNOSIS — Z794 Long term (current) use of insulin: Secondary | ICD-10-CM | POA: Insufficient documentation

## 2022-07-25 DIAGNOSIS — M79671 Pain in right foot: Secondary | ICD-10-CM | POA: Insufficient documentation

## 2022-07-25 DIAGNOSIS — Z593 Problems related to living in residential institution: Secondary | ICD-10-CM

## 2022-07-25 DIAGNOSIS — Z7982 Long term (current) use of aspirin: Secondary | ICD-10-CM | POA: Insufficient documentation

## 2022-07-25 DIAGNOSIS — Z79899 Other long term (current) drug therapy: Secondary | ICD-10-CM | POA: Insufficient documentation

## 2022-07-25 LAB — CBG MONITORING, ED
Glucose-Capillary: 104 mg/dL — ABNORMAL HIGH (ref 70–99)
Glucose-Capillary: 81 mg/dL (ref 70–99)

## 2022-07-25 MED ORDER — METOPROLOL TARTRATE 25 MG PO TABS
25.0000 mg | ORAL_TABLET | Freq: Two times a day (BID) | ORAL | Status: DC
  Administered 2022-07-25: 25 mg via ORAL
  Filled 2022-07-25: qty 1

## 2022-07-25 MED ORDER — LISINOPRIL 20 MG PO TABS
20.0000 mg | ORAL_TABLET | Freq: Every day | ORAL | Status: DC
  Administered 2022-07-25: 20 mg via ORAL
  Filled 2022-07-25: qty 1

## 2022-07-25 MED ORDER — NORTRIPTYLINE HCL 25 MG PO CAPS
25.0000 mg | ORAL_CAPSULE | Freq: Two times a day (BID) | ORAL | Status: DC
  Administered 2022-07-25: 25 mg via ORAL
  Filled 2022-07-25 (×2): qty 1

## 2022-07-25 MED ORDER — INSULIN ASPART 100 UNIT/ML IJ SOLN: 0.0000 [IU] | Freq: Every day | INTRAMUSCULAR | Status: DC

## 2022-07-25 MED ORDER — ATORVASTATIN CALCIUM 40 MG PO TABS
80.0000 mg | ORAL_TABLET | Freq: Every day | ORAL | Status: DC
  Administered 2022-07-25: 80 mg via ORAL
  Filled 2022-07-25: qty 2

## 2022-07-25 MED ORDER — THIAMINE MONONITRATE 100 MG PO TABS
100.0000 mg | ORAL_TABLET | Freq: Every day | ORAL | Status: DC
  Administered 2022-07-25: 100 mg via ORAL
  Filled 2022-07-25: qty 1

## 2022-07-25 MED ORDER — CEPHALEXIN 250 MG PO CAPS
500.0000 mg | ORAL_CAPSULE | Freq: Two times a day (BID) | ORAL | Status: DC
  Administered 2022-07-25: 500 mg via ORAL
  Filled 2022-07-25: qty 2

## 2022-07-25 MED ORDER — ASPIRIN 81 MG PO CHEW
81.0000 mg | CHEWABLE_TABLET | Freq: Every day | ORAL | Status: DC
  Administered 2022-07-25: 81 mg via ORAL
  Filled 2022-07-25: qty 1

## 2022-07-25 MED ORDER — VITAMIN B-12 100 MCG PO TABS: 500.0000 ug | ORAL_TABLET | Freq: Every day | ORAL | Status: DC

## 2022-07-25 MED ORDER — FUROSEMIDE 20 MG PO TABS: 40.0000 mg | ORAL_TABLET | Freq: Two times a day (BID) | ORAL | Status: DC

## 2022-07-25 MED ORDER — CEPHALEXIN 500 MG PO TABS: 500.0000 mg | ORAL_TABLET | Freq: Two times a day (BID) | ORAL | Status: DC

## 2022-07-25 MED ORDER — GABAPENTIN 300 MG PO CAPS
300.0000 mg | ORAL_CAPSULE | Freq: Three times a day (TID) | ORAL | Status: DC
  Administered 2022-07-25: 300 mg via ORAL
  Filled 2022-07-25: qty 1

## 2022-07-25 MED ORDER — INSULIN ASPART 100 UNIT/ML IJ SOLN: 0.0000 [IU] | Freq: Three times a day (TID) | INTRAMUSCULAR | Status: DC

## 2022-07-25 NOTE — ED Provider Notes (Signed)
Signed out to Dr. Durwin Nora at change of shift    [name] Pesqueira, MD 07/25/22 [phone]

## 2022-07-25 NOTE — [place] FL2 (Signed)
[place] MEDICAID FL2 LEVEL OF CARE FORM     IDENTIFICATION  Patient Name: [name] Oneal Birthdate: 02/13/1946 Sex: male Admission Date (Current Location): 07/25/2022  [facility] and [place] Number:  Producer, television/film/video and Address:  [facility], [address] [place], [place] [address]      Provider Number: [id number]  Attending Physician Name and Address:  Default, Provider, MD  Relative Name and Phone Number:  Carola Rhine [phone]    Current Level of Care: Hospital Recommended Level of Care: Skilled Nursing Facility Prior Approval Number:    Date Approved/Denied:   PASRR Number: [id number]  Discharge Plan: SNF    Current Diagnoses: Patient Active Problem List   Diagnosis Date Noted   AKI (acute kidney injury) (HCC) 07/01/2022   Moderate protein-energy malnutrition (HCC) 06/30/2022   Pneumonia of left lower lobe due to infectious organism 06/30/2022   Hypernatremia 06/29/2022   Aspiration pneumonia of right lower lobe (HCC) 06/29/2022   Alcohol withdrawal syndrome, with delirium (HCC) 06/29/2022   Delirium tremens (HCC) 06/23/2022   Hyperglycemia 06/23/2022   Acute respiratory failure with hypoxia and hypercapnia (HCC) 06/22/2022   COPD with acute exacerbation (HCC) 06/22/2022   Acute metabolic encephalopathy 06/22/2022   NSTEMI (non-ST elevated myocardial infarction) (HCC) 06/22/2022   Unstable angina (HCC) 06/20/2022   Alcohol abuse with alcohol-induced mood disorder (HCC) 09/17/2017   HYPERTENSION, UNSPECIFIED 07/19/2008   BRONCHITIS, CHRONIC 07/19/2008    Orientation RESPIRATION BLADDER Height & Weight     Self  Normal Continent, External catheter (Urostomy) Weight: 229 lb 4.5 oz (104 kg) Height:  5\' 10"  (177.8 cm)  BEHAVIORAL SYMPTOMS/MOOD NEUROLOGICAL BOWEL NUTRITION STATUS      Incontinent Diet  AMBULATORY STATUS COMMUNICATION OF NEEDS Skin   Extensive Assist Verbally                         Personal  Care Assistance Level of Assistance    Bathing Assistance: Maximum assistance Feeding assistance: Maximum assistance Dressing Assistance: Maximum assistance     Functional Limitations Info    Sight Info: Adequate Hearing Info: Adequate Speech Info: Adequate    SPECIAL CARE FACTORS FREQUENCY  PT (By licensed PT), OT (By licensed OT)     PT Frequency: 5 x weekly OT Frequency: 5 x weekly            Contractures Contractures Info: Not present    Additional Factors Info  Allergies, Code Status, Insulin Sliding Scale Code Status Info: DNR Allergies Info: NKA Psychotropic Info: Quetiaphine (Seroquel) 12.5 mg daily, Quetiapine (Seroquel) 25 mg daily at bedtime Insulin Sliding Scale Info: Insulin Aspart (Novolog) injection 0-9 units every 4 hrs, Insulin Glargine-yfgn (Semglee) injection 8 units daily       Current Medications (07/25/2022):  This is the current hospital active medication list No current facility-administered medications for this encounter.   Current Outpatient Medications  Medication Sig Dispense Refill   aspirin 81 MG chewable tablet Chew 1 tablet (81 mg total) by mouth daily. 30 tablet 0   atorvastatin (LIPITOR) 80 MG tablet Take 1 tablet (80 mg total) by mouth daily. 30 tablet 0   Cephalexin 500 MG tablet Take 500 mg by mouth in the morning and at bedtime.     folic acid (FOLVITE) 1 MG tablet Take 1 tablet (1 mg total) by mouth daily. 30 tablet 0   furosemide (LASIX) 40 MG tablet Take 1 tablet (40 mg total) by mouth  2 (two) times daily. 60 tablet 0   gabapentin (NEURONTIN) 300 MG capsule Take 1 capsule (300 mg total) by mouth 2 (two) times daily. (Patient taking differently: Take 300 mg by mouth 3 (three) times daily.) 60 capsule 0   insulin glargine-yfgn (SEMGLEE) 100 UNIT/ML injection Inject 0.08 mLs (8 Units total) into the skin daily. 2.4 mL 0   lisinopril (ZESTRIL) 20 MG tablet Take 1 tablet (20 mg total) by mouth daily. 30 tablet 0   metoprolol tartrate  (LOPRESSOR) 25 MG tablet Take 1 tablet (25 mg total) by mouth 2 (two) times daily. 60 tablet 0   Multiple Vitamin (MULTIVITAMIN WITH MINERALS) TABS tablet Take 1 tablet by mouth daily. 30 tablet 0   nicotine (NICODERM CQ - DOSED IN MG/24 HOURS) 21 mg/24hr patch Place 1 patch (21 mg total) onto the skin daily. 30 patch 0   nortriptyline (PAMELOR) 25 MG capsule Take 25 mg by mouth 2 (two) times daily.     thiamine (VITAMIN B-1) 100 MG tablet Take 1 tablet (100 mg total) by mouth daily. 30 tablet 0   vitamin B-12 (VITAMIN B12) 500 MCG tablet Take 1 tablet (500 mcg total) by mouth daily. 30 tablet 0   HYDROcodone-acetaminophen (NORCO/VICODIN) 5-325 MG tablet Take 2 tablets by mouth every 4 (four) hours as needed for up to 5 days. (Patient not taking: Reported on 07/25/2022) 10 tablet 0   polyethylene glycol (MIRALAX) 17 g packet Take 17 g by mouth daily. (Patient not taking: Reported on 07/25/2022) 14 each 0     Discharge Medications: Please see discharge summary for a list of discharge medications.  Relevant Imaging Results:  Relevant Lab Results:   Additional Information SSN#  [id number]     Ht:  5\' 10"       Wt:  229 lbs 4.5oz     BMI:  32.90 kg/m2  Missey Hasley C Tarpley-Carter, LCSWA

## 2022-07-25 NOTE — ED Notes (Signed)
Assisted pt w/ bedpan.

## 2022-07-25 NOTE — ED Provider Notes (Signed)
[place] EMERGENCY DEPARTMENT AT [facility] Provider Note   CSN: [id number] Arrival date & time: 07/25/22  4098     History  Chief Complaint  Patient presents with   Foot Pain    Patient to ED via EMS [facility] with complaint of foot pain which patient was seen for earlier. Patient was brought back reportedly when [facility] sent the patient back.    [name] Oneal is a 77 y.o. male.  The history is provided by the patient and the EMS personnel.  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 Authorizing Provider  aspirin 81 MG chewable tablet Chew 1 tablet (81 mg total) by mouth daily. 07/15/22   Harold Hedge, MD  atorvastatin (LIPITOR) 80 MG tablet Take 1 tablet (80 mg total) by mouth daily. 07/15/22   Harold Hedge, MD  folic acid (FOLVITE) 1 MG tablet Take 1 tablet (1 mg total) by mouth daily. 07/15/22   Harold Hedge, MD  furosemide (LASIX) 40 MG tablet Take 1 tablet (40 mg total) by mouth 2 (two) times daily. 07/14/22 08/13/22  Harold Hedge, MD  gabapentin (NEURONTIN) 300 MG capsule Take 1 capsule (300 mg total) by mouth 2 (two) times daily. 07/14/22 08/13/22  Harold Hedge, MD  HYDROcodone-acetaminophen (NORCO/VICODIN) 5-325 MG tablet Take 2 tablets by mouth every 4 (four) hours as needed for up to 5 days. 07/24/22 07/29/22  Curley Spice, MD  insulin glargine-yfgn (SEMGLEE) 100 UNIT/ML injection Inject 0.08 mLs (8 Units total) into the skin daily. 07/15/22   Harold Hedge, MD  lisinopril  (ZESTRIL) 20 MG tablet Take 1 tablet (20 mg total) by mouth daily. 07/15/22   Harold Hedge, MD  metoprolol tartrate (LOPRESSOR) 25 MG tablet Take 1 tablet (25 mg total) by mouth 2 (two) times daily. 07/14/22 08/13/22  Harold Hedge, MD  Multiple Vitamin (MULTIVITAMIN WITH MINERALS) TABS tablet Take 1 tablet by mouth daily. 07/15/22   Pudota, Elsie Ra, MD  nicotine (NICODERM CQ - DOSED IN MG/24 HOURS) 21 mg/24hr patch Place 1 patch (21 mg total) onto the skin daily. 07/15/22   Harold Hedge, MD  polyethylene glycol (MIRALAX) 17 g packet Take 17 g by mouth daily. 07/24/22   Curley Spice, MD  thiamine (VITAMIN B-1) 100 MG tablet Take 1 tablet (100 mg total) by mouth daily. 07/15/22   Harold Hedge, MD  vitamin B-12 (VITAMIN B12) 500 MCG tablet Take 1 tablet (500 mcg total) by mouth daily. 07/15/22   Harold Hedge, MD      Allergies    Patient has no known allergies.    Review of Systems   Review of Systems  Physical Exam Updated Vital Signs BP 138/67   Pulse 73   Temp 98 F (36.7 C) (Oral)   Resp 19   Ht 5\' 10"  (1.778 m)   Wt 104 kg   SpO2 95%   BMI 32.90 kg/m  Physical Exam Vitals and nursing note reviewed. Exam conducted with a chaperone present.  Constitutional:  General: He is not in acute distress.    Appearance: Normal appearance. He is well-developed. He is not diaphoretic.  HENT:     Head: Normocephalic and atraumatic.     Nose: Nose normal.     Mouth/Throat:     Mouth: Mucous membranes are moist.     Pharynx: Oropharynx is clear.  Eyes:     Conjunctiva/sclera: Conjunctivae normal.     Pupils: Pupils are equal, round, and reactive to light.  Cardiovascular:     Rate and Rhythm: Normal rate and regular rhythm.     Pulses: Normal pulses.     Heart sounds: Normal heart sounds.  Pulmonary:     Effort: Pulmonary effort is normal.     Breath sounds: Normal breath sounds. No wheezing or rales.  Abdominal:      General: Bowel sounds are normal.     Palpations: Abdomen is soft.     Tenderness: There is no abdominal tenderness. There is no guarding or rebound.  Musculoskeletal:        General: Normal range of motion.     Cervical back: Normal range of motion and neck supple.       Feet:  Skin:    General: Skin is warm and dry.     Capillary Refill: Capillary refill takes less than 2 seconds.  Neurological:     Mental Status: He is alert.     Cranial Nerves: No cranial nerve deficit.     Deep Tendon Reflexes: Reflexes normal.  Psychiatric:        Mood and Affect: Mood normal.     ED Results / Procedures / Treatments   Labs (all labs ordered are listed, but only abnormal results are displayed) Labs Reviewed - No data to display  EKG None  Radiology DG Foot Complete Left  Result Date: 07/24/2022 CLINICAL DATA:  Unstageable pressure wound to the heel EXAM: LEFT FOOT - COMPLETE 3+ VIEW COMPARISON:  None Available. FINDINGS: No cortical erosion or destruction. There is no evidence of fracture or dislocation. Old healed fifth digit proximal phalanx fracture. Likely similar finding of the third digit proximal phalanx. First digit metatarsophalangeal joint mild to moderate degenerative changes. Posterior and plantar calcaneal spur. Question exostosis arising from the third digit metatarsal neck/distal body. Soft tissues are unremarkable. IMPRESSION: 1.  No acute displaced fracture or dislocation. 2. No radiographic findings to suggest osteomyelitis. Limited evaluation due to overlapping osseous structures and overlying soft tissues. If high clinical concern, please consider MRI for further evaluation (with intravenous contrast if GFR greater than 30). 3. Question exostosis arising from the third digit metatarsal neck/distal body. Electronically Signed   By: Tish Frederickson M.D.   On: 07/24/2022 21:16   CT Cervical Spine Wo Contrast  Result Date: 07/24/2022 CLINICAL DATA:  Recent fall with headaches  and neck pain, initial encounter EXAM: CT HEAD WITHOUT CONTRAST CT CERVICAL SPINE WITHOUT CONTRAST TECHNIQUE: Multidetector CT imaging of the head and cervical spine was performed following the standard protocol without intravenous contrast. Multiplanar CT image reconstructions of the cervical spine were also generated. RADIATION DOSE REDUCTION: This exam was performed according to the departmental dose-optimization program which includes automated exposure control, adjustment of the mA and/or kV according to patient size and/or use of iterative reconstruction technique. COMPARISON:  06/30/2022 FINDINGS: CT HEAD FINDINGS Brain: Chronic atrophic changes and white matter ischemic changes are noted. 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 No acute hemorrhage or acute infarction is seen. No space-occupying  mass lesion is noted. Vascular: No hyperdense vessel or unexpected calcification. Skull: Normal. Negative for fracture or focal lesion. Sinuses/Orbits: Mucosal thickening is noted within the sphenoid sinus on the left Other: None. CT CERVICAL SPINE FINDINGS Alignment: Within normal limits. Skull base and vertebrae: 7 cervical segments are well visualized. Anterior flowing osteophytes are noted from C5-C7. Facet hypertrophic changes are noted. No acute fracture or acute facet abnormality is noted. The odontoid is within normal limits. Soft tissues and spinal canal: Surrounding soft tissue structures are within normal limits. No focal hematoma is noted. Diffuse vascular calcifications are seen. Upper chest: Visualized lung apices are unremarkable. Other: None IMPRESSION: CT of the head: Chronic atrophic and ischemic changes without acute abnormality. CT of the cervical spine: Multilevel degenerative change without acute abnormality. Electronically Signed   By: Alcide Clever M.D.   On: 07/24/2022 21:14   CT Head Wo Contrast  Result Date: 07/24/2022 CLINICAL DATA:  Recent fall with headaches and neck  pain, initial encounter EXAM: CT HEAD WITHOUT CONTRAST CT CERVICAL SPINE WITHOUT CONTRAST TECHNIQUE: Multidetector CT imaging of the head and cervical spine was performed following the standard protocol without intravenous contrast. Multiplanar CT image reconstructions of the cervical spine were also generated. RADIATION DOSE REDUCTION: This exam was performed according to the departmental dose-optimization program which includes automated exposure control, adjustment of the mA and/or kV according to patient size and/or use of iterative reconstruction technique. COMPARISON:  06/30/2022 FINDINGS: CT HEAD FINDINGS Brain: Chronic atrophic changes and white matter ischemic changes are noted. 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 No acute hemorrhage or acute infarction is seen. No space-occupying mass lesion is noted. Vascular: No hyperdense vessel or unexpected calcification. Skull: Normal. Negative for fracture or focal lesion. Sinuses/Orbits: Mucosal thickening is noted within the sphenoid sinus on the left Other: None. CT CERVICAL SPINE FINDINGS Alignment: Within normal limits. Skull base and vertebrae: 7 cervical segments are well visualized. Anterior flowing osteophytes are noted from C5-C7. Facet hypertrophic changes are noted. No acute fracture or acute facet abnormality is noted. The odontoid is within normal limits. Soft tissues and spinal canal: Surrounding soft tissue structures are within normal limits. No focal hematoma is noted. Diffuse vascular calcifications are seen. Upper chest: Visualized lung apices are unremarkable. Other: None IMPRESSION: CT of the head: Chronic atrophic and ischemic changes without acute abnormality. CT of the cervical spine: Multilevel degenerative change without acute abnormality. Electronically Signed   By: Alcide Clever M.D.   On: 07/24/2022 21:14   DG Foot Complete Right  Result Date: 07/24/2022 CLINICAL DATA:  Unstageable pressure wound to the heel EXAM:  RIGHT FOOT COMPLETE - 3+ VIEW COMPARISON:  None Available. FINDINGS: No cortical erosion or destruction. There is no evidence of fracture or dislocation. First digit metatarsophalangeal joint moderate degenerative changes. Plantar and posterior calcaneal spur. Soft tissues are unremarkable. IMPRESSION: 1. No acute displaced fracture or dislocation. 2. No radiographic findings to suggest osteomyelitis. Limited evaluation due to overlapping osseous structures and overlying soft tissues. If high clinical concern, please consider MRI for further evaluation (with intravenous contrast if GFR greater than 30). Electronically Signed   By: Tish Frederickson M.D.   On: 07/24/2022 21:14    Procedures Procedures    Medications Ordered in ED Medications - No data to display  ED Course/ Medical Decision Making/ A&P  Medical Decision Making Refused by nursing home   Amount and/or Complexity of Data Reviewed Independent Historian: EMS    Details: See above  External Data Reviewed: labs and notes.    Details: Previous ED visit reviewed   Risk Risk Details: No signs of infection of either foot on exam.  Scabs are chronic.  Awaiting input from social work     Final Clinical Impression(s) / ED Diagnoses Final diagnoses:  None   Signed out to AM attending pending social work consult to  Rx / DC Orders ED Discharge Orders     None         Jayce Boyko, MD 07/25/22 [phone]

## 2022-07-25 NOTE — Progress Notes (Unsigned)
Cardiology Clinic Note   Date: 07/25/2022 ID: Daiquan, Resnik [birth year], MRN [id number]  Primary Cardiologist:  Rollene Rotunda, MD  Patient Profile    Searcy Miyoshi is a 77 y.o. male who presents to the clinic today for ***  Past medical history significant for: CAD. LHC 11/03/2001 (unstable angina): PCI with DES to LCx.  LHC 06/27/2008 (acute MI): PCI with balloon angioplasty. Echo 06/24/2022: EF 50 to 55%.  Global hypokinesis.  Moderate LVH.  Grade II DD.  Moderately enlarged RV.  Moderately reduced RV function.  Moderately elevated pulmonary artery pressure.  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 Hypertension. COPD. EtOH dependence. Tobacco abuse.   History of Present Illness    Shea Kapur has a history of CAD with PCI in August 2003 and March 2010.  No apparent follow-up following his last cath.    Patient was evaluated by Dr. Antoine Poche on 06/20/2022 during hospitalization for unstable angina/NSTEMI.  Presented to the ER via EMS on 06/20/2022 with complaints of chest pain and DOE.  Pain relieved with aspirin 325 mg and nitro x 1.  Patient was noted to be hypoxic by EMS with O2 sat of 89 to 90% and started on 3 L O2 [place].  Patient reported he had not been taking any medications.  He is an everyday smoker and a heavy drinker consuming 20 liquor drinks a day.  Reported gripping, heavy, substernal chest pain that was persistent.  He states he has experienced this pain previously but it typically "goes away after a few shots of liquor."  Patient noted he ran out of alcohol and had no way to treat his pain so he contacted EMS.  Plan for heart catheterization on 06/22/2022.  Patient being followed with EtOH withdrawal protocol.  Catheterization canceled secondary to EtOH withdrawal and patient being uncooperative.  Patient determined to not be a good candidate for invasive angiography secondary to continued AMS.  Patient was  discharged to SNF on 07/14/2022.  Patient presented to ED 07/24/2022 with complaints of bilateral foot pain secondary to ulcerations on his heels.  He reported he attempted to get up and walk and fell injuring his great toe.  Was determined to be stable for discharge back to nursing home.  Unfortunately, EMS return to the ED with the patient secondary to the nursing home refusing to admit him back to the facility.  Patient eventually excepted back to nursing home with the assistance of social work.  Today, patient ***  CAD.  S/p PCI with DES to LCx August 2023.  PCI with balloon angioplasty March 2010.  Patient most recently hospitalized with unstable angina/NSTEMI March 2024.  LHC complicated by EtOH withdrawal making Patient an appropriate candidate for invasive angiography and he was treated medically.  Patient was discharged to SNF on 07/14/2022.  He*** Continue aspirin, atorvastatin, metoprolol. Hypertension. BP today *** Patient denies headaches, dizziness or vision changes. Continue lisinopril, metoprolol.    ROS: All other systems reviewed and are otherwise negative except as noted in History of Present Illness.  Studies Reviewed    ECG personally reviewed by me today: ***  No significant changes from ***  Risk Assessment/Calculations    {Does this patient have ATRIAL FIBRILLATION?:[phone]}          Physical Exam    VS:  There were no vitals taken for this visit. , BMI There is no height or weight on file to calculate BMI.  GEN:  Well nourished, well developed, in no acute distress. Neck: No JVD or carotid bruits. Cardiac: *** RRR. No murmurs. No rubs or gallops.   Respiratory:  Respirations regular and unlabored. Clear to auscultation without rales, wheezing or rhonchi. GI: Soft, nontender, nondistended. Extremities: Radials/DP/PT 2+ and equal bilaterally. No clubbing or cyanosis. No edema ***  Skin: Warm and dry, no rash. Neuro: Strength intact.  Assessment & Plan    ***  Disposition: ***     {Are you ordering a CV Procedure (e.g. stress test, cath, DCCV, TEE, etc)?   Press F2        :[id number]}   Signed, Etta Grandchild. Keishla Oyer, DNP, NP-C

## 2022-07-25 NOTE — ED Provider Notes (Signed)
This patient was seen in the ED yesterday and discharged back to [facility] nursing facility.  When PT are brought him back there, they refused to accept him back and he return to the ED.  Social work to consult for help with his return to facility. Physical Exam  BP (!) 155/73   Pulse 91   Temp (!) 97.5 F (36.4 C) (Oral)   Resp 16   Ht 5\' 10"  (1.778 m)   Wt 104 kg   SpO2 97%   BMI 32.90 kg/m   Physical Exam Vitals and nursing note reviewed.  Constitutional:      General: He is not in acute distress.    Appearance: Normal appearance. He is well-developed. He is not ill-appearing, toxic-appearing or diaphoretic.  HENT:     Head: Normocephalic and atraumatic.     Right Ear: External ear normal.     Left Ear: External ear normal.     Nose: Nose normal.  Eyes:     Extraocular Movements: Extraocular movements intact.     Conjunctiva/sclera: Conjunctivae normal.  Cardiovascular:     Rate and Rhythm: Normal rate and regular rhythm.  Pulmonary:     Effort: Pulmonary effort is normal. No respiratory distress.  Abdominal:     Palpations: Abdomen is soft.  Musculoskeletal:        General: Normal range of motion.     Cervical back: Normal range of motion and neck supple.  Skin:    General: Skin is warm and dry.     Coloration: Skin is not jaundiced or pale.  Neurological:     General: No focal deficit present.     Mental Status: He is alert and oriented to person, place, and time.  Psychiatric:        Mood and Affect: Mood normal.        Behavior: Behavior normal.        Thought Content: Thought content normal.        Judgment: Judgment normal.     Procedures  Procedures  ED Course / MDM    Medical Decision Making Risk OTC drugs. Prescription drug management.   Home medications were ordered.  Social work was in Health and safety inspector nursing facility who does now agree to accept patient back.  Patient was discharged in stable condition.       Gloris Manchester,  MD 07/25/22 1311

## 2022-07-25 NOTE — ED Notes (Signed)
Brief and linens changed. Pt repositioned for comfort

## 2022-07-25 NOTE — Progress Notes (Addendum)
TOC CSW Velna Hatchet @ [email or web address] [phone] stated they will be accepting pt back under stipulations that the family are aware of.  Velna Hatchet will present CSW will call report and room number shortly.  CSW will update RN as soon as this information is received.  Jestin Burbach Tarpley-Carter, MSW, LCSW-A Pronouns:  She/Her/Hers [facility] Clinical Social Worker Direct Number:  [phone] [email or web address]

## 2022-07-25 NOTE — ED Notes (Signed)
Pt taken off bedpan. Put on new brief w/ pad underneath.

## 2022-07-25 NOTE — Progress Notes (Signed)
TOC CSW attempted to contact both Tammi and Melody with no answer.  CSW left HIPPA compliant message with my contact information on Melody's vm.  Mariella Blackwelder Tarpley-Carter, MSW, LCSW-A Pronouns:  She/Her/Hers [facility] Clinical Social Worker Direct Number:  [phone] [email or web address]

## 2022-07-25 NOTE — Progress Notes (Signed)
TOC CSW attempted to contact both, pts significant other, Melody Ward [phone] and pts daughter, Carola Rhine [phone], no answer.  CSW left a vm message for Melody, but could not for Tammi (mailbox full).  CSW will continue to reach out to [place] and Melody.  Jinelle Butchko Tarpley-Carter, MSW, LCSW-A Pronouns:  She/Her/Hers [facility] Clinical Social Worker Direct Number:  [phone] [email or web address]

## 2022-07-25 NOTE — Progress Notes (Signed)
TOC CSW received a call from [place] with call report information.  Call report information has been shared with RN.  Call Report #:  [phone]  Room #: 300 Hall  Call Report RN:  Ardyth Gal Tarpley-Carter, MSW, LCSW-A Pronouns:  She/Her/Hers [facility] Clinical Social Worker Direct Number:  [phone] [email or web address]

## 2022-07-25 NOTE — Progress Notes (Signed)
TOC CSW contacted [facility] [phone].  CSW attempted to speak with DON or Admin, CSW left name and phone number for them to contact.  Sagan Maselli Tarpley-Carter, MSW, LCSW-A Pronouns:  She/Her/Hers [facility] Clinical Social Worker Direct Number:  [phone] [email or web address]

## 2022-07-27 ENCOUNTER — Ambulatory Visit: Payer: Non-veteran care | Attending: Student | Admitting: Student

## 2022-07-27 ENCOUNTER — Encounter: Payer: Self-pay | Admitting: Student

## 2022-07-27 ENCOUNTER — Telehealth: Payer: Self-pay | Admitting: Cardiology

## 2022-07-27 VITALS — BP 90/50 | HR 80 | Ht 70.0 in | Wt 235.0 lb

## 2022-07-27 DIAGNOSIS — I10 Essential (primary) hypertension: Secondary | ICD-10-CM

## 2022-07-27 DIAGNOSIS — Z720 Tobacco use: Secondary | ICD-10-CM | POA: Diagnosis not present

## 2022-07-27 DIAGNOSIS — F1010 Alcohol abuse, uncomplicated: Secondary | ICD-10-CM

## 2022-07-27 DIAGNOSIS — E785 Hyperlipidemia, unspecified: Secondary | ICD-10-CM

## 2022-07-27 DIAGNOSIS — I2510 Atherosclerotic heart disease of native coronary artery without angina pectoris: Secondary | ICD-10-CM

## 2022-07-27 NOTE — Telephone Encounter (Signed)
Patient's daughter is requesting to speak with someone about the pt's appt today. Please advise.

## 2022-07-27 NOTE — Patient Instructions (Signed)
Medication Instructions:  Your physician recommends that you continue on your current medications as directed. Please refer to the Current Medication list given to you today.  *If you need a refill on your cardiac medications before your next appointment, please call your pharmacy*   Lab Work: NONE If you have labs (blood work) drawn today and your tests are completely normal, you will receive your results only by: MyChart Message (if you have MyChart) OR A paper copy in the mail If you have any lab test that is abnormal or we need to change your treatment, we will call you to review the results.   Testing/Procedures: NONE   Follow-Up: At [facility], you and your health needs are our priority.  As part of our continuing mission to provide you with exceptional heart care, we have created designated Provider Care Teams.  These Care Teams include your primary Cardiologist (physician) and Advanced Practice Providers (APPs -  Physician Assistants and Nurse Practitioners) who all work together to provide you with the care you need, when you need it.  We recommend signing up for the patient portal called "MyChart".  Sign up information is provided on this After Visit Summary.  MyChart is used to connect with patients for Virtual Visits (Telemedicine).  Patients are able to view lab/test results, encounter notes, upcoming appointments, etc.  Non-urgent messages can be sent to your provider as well.   To learn more about what you can do with MyChart, go to [email or web address].    Your next appointment:   3 month(s)  Provider:   Rollene Rotunda, MD

## 2022-07-27 NOTE — Progress Notes (Signed)
Cardiology Clinic Note   Date: 07/27/2022 ID: Menno, Vanbergen 1945-05-18, MRN [id number]  Primary Cardiologist:  Rollene Rotunda, MD  Patient Profile    [name] Oneal is a 76 y.o. male who presents to the clinic today for hospital follow-up.   Past medical history significant for: CAD. LHC 11/03/2001 (unstable angina): PCI with DES to LCx.  LHC 06/27/2008 (acute MI): PCI with balloon angioplasty. Echo 06/24/2022: EF 50 to 55%.  Global hypokinesis.  Moderate LVH.  Grade II DD.  Moderately enlarged RV.  Moderately reduced RV function.  Moderately elevated pulmonary artery pressure.  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 Hypertension. Hyperlipidemia. Lipid panel 06/23/2022: LDL 106, HDL 62, TG 93, total 187. COPD. EtOH dependence. Tobacco abuse.   History of Present Illness    [name] Oneal has a history of CAD with PCI in August 2003 and March 2010.  No apparent follow-up following his last cath.    Patient was evaluated by Dr. Antoine Poche on 06/20/2022 during hospitalization for unstable angina/NSTEMI.  Presented to the ER via EMS on 06/20/2022 with complaints of chest pain and DOE.  Pain relieved with aspirin 325 mg and nitro x 1.  Patient was noted to be hypoxic by EMS with O2 sat of 89 to 90% and started on 3 L O2 [place].  Patient reported he had not been taking any medications.  He is an everyday smoker and a heavy drinker consuming 20 liquor drinks a day.  Reported gripping, heavy, substernal chest pain that was persistent.  He states he has experienced this pain previously but it typically "goes away after a few shots of liquor."  Patient noted he ran out of alcohol and had no way to treat his pain so he contacted EMS.  Plan for heart catheterization on 06/22/2022.  Patient being followed with EtOH withdrawal protocol.  Catheterization canceled secondary to EtOH withdrawal and patient being uncooperative.  Patient determined to  not be a good candidate for invasive angiography secondary to continued AMS.  Patient was discharged to SNF on 07/14/2022.  Patient presented to ED 07/24/2022 with complaints of bilateral foot pain secondary to ulcerations on his heels.  He reported he attempted to get up and walk and fell injuring his great toe.  Was determined to be stable for discharge back to nursing home.  Unfortunately, EMS return to the ED with the patient secondary to the nursing home refusing to admit him back to the facility.  Patient eventually excepted back to nursing home with the assistance of social work.  Today, patient is here alone. Patient denies shortness of breath or dyspnea on exertion. No chest pain, pressure, or tightness. Denies lower extremity edema, orthopnea, or PND. No palpitations. BP is soft today. He does not know what his BP is at the facility. He has been working with PT and tolerating it well. He has continued pain in bilateral heels. He is somnolent today but answers questions appropriately.      ROS: All other systems reviewed and are otherwise negative except as noted in History of Present Illness.  Studies Reviewed    ECG is not ordered today.   Physical Exam    VS:  BP (!) 90/50 (BP Location: Left Arm, Patient Position: Sitting, Cuff Size: Normal)   Pulse 80   Ht 5\' 10"  (1.778 m)   Wt 235 lb (106.6 kg)   BMI 33.72 kg/m  , BMI Body mass index is  33.72 kg/m.  GEN: Well nourished, well developed, in no acute distress. Neck: No JVD or carotid bruits. Cardiac:  RRR. No murmurs. No rubs or gallops.   Respiratory:  Respirations regular and unlabored. Clear to auscultation without rales, wheezing or rhonchi. GI: Soft, nontender, nondistended. Extremities: Radials/DP/PT 2+ and equal bilaterally. No clubbing or cyanosis. Mild, nonpitting lower extremity edema bilateral lower extremities.   Skin: Warm and dry, no rash. Neuro: Strength intact.  Assessment & Plan    CAD.  S/p PCI with DES  to LCx August 2023.  PCI with balloon angioplasty March 2010.  Patient most recently hospitalized with unstable angina/NSTEMI March 2024.  LHC complicated by EtOH withdrawal making Patient an appropriate candidate for invasive angiography and he was treated medically.  Patient was discharged to SNF on 07/14/2022.  He denies chest pain, pressure or tightness. He does not complain of shortness of breath or DOE. He is working with PT at the facility without chest pain or shortness of breath. Continue aspirin, atorvastatin, metoprolol. Hypertension. BP today 90/50. Patient denies headaches, dizziness or vision changes. Continue lisinopril, metoprolol. Instructed SNF to contact office if patient develops dizziness.  Hyperlipidemia. LDL March 2024 101, not at goal. Prior to hospitalization patient was not taking any medications. Continue atorvastatin. Consider repeat lipid panel at next visit.  Tobacco abuse. Patient reports he is getting nicotine patches at the nursing facility.  EtOH abuse. Patient is residing at [facility] for now. He is somnolent today but answers questions appropriately. He does not appear confused today.   Disposition: Return in 3 months or sooner as needed.          Signed, Etta Grandchild. Darryll Raju, DNP, NP-C

## 2022-07-27 NOTE — Telephone Encounter (Signed)
Spoke with pt's daughter [name]), pt is with daughter and states that I can talk to her. Tammi would like an update on how things went with Gavin Pound today. Per AVS no changes were made today, Gavin Pound recommended that pt see Dr. Antoine Poche back in 3 months. That appointment was made for 8/5 at 11am. Advised daughter to fill out a DPR while here the next time so that we can discuss protected health information with her when needed. She will plan to do that and she will also bring paperwork stating that she is pt's Power of [address].

## 2022-07-28 ENCOUNTER — Inpatient Hospital Stay (HOSPITAL_COMMUNITY)
Admission: EM | Admit: 2022-07-28 | Discharge: 2022-07-30 | DRG: 871 | Disposition: A | Discharge status: Hospice | Payer: Medicare PPO | Source: Skilled Nursing Facility | Attending: Internal Medicine | Admitting: Internal Medicine

## 2022-07-28 ENCOUNTER — Other Ambulatory Visit: Payer: Self-pay

## 2022-07-28 ENCOUNTER — Emergency Department (HOSPITAL_COMMUNITY): Payer: Medicare PPO

## 2022-07-28 ENCOUNTER — Encounter (HOSPITAL_COMMUNITY): Payer: Self-pay

## 2022-07-28 ENCOUNTER — Observation Stay (HOSPITAL_COMMUNITY): Payer: Medicare PPO

## 2022-07-28 DIAGNOSIS — D72829 Elevated white blood cell count, unspecified: Secondary | ICD-10-CM | POA: Insufficient documentation

## 2022-07-28 DIAGNOSIS — A419 Sepsis, unspecified organism: Secondary | ICD-10-CM | POA: Diagnosis not present

## 2022-07-28 DIAGNOSIS — Z9861 Coronary angioplasty status: Secondary | ICD-10-CM

## 2022-07-28 DIAGNOSIS — Z79899 Other long term (current) drug therapy: Secondary | ICD-10-CM

## 2022-07-28 DIAGNOSIS — F17210 Nicotine dependence, cigarettes, uncomplicated: Secondary | ICD-10-CM | POA: Diagnosis present

## 2022-07-28 DIAGNOSIS — R1084 Generalized abdominal pain: Secondary | ICD-10-CM

## 2022-07-28 DIAGNOSIS — R7303 Prediabetes: Secondary | ICD-10-CM | POA: Insufficient documentation

## 2022-07-28 DIAGNOSIS — I2699 Other pulmonary embolism without acute cor pulmonale: Secondary | ICD-10-CM | POA: Diagnosis not present

## 2022-07-28 DIAGNOSIS — Z9981 Dependence on supplemental oxygen: Secondary | ICD-10-CM

## 2022-07-28 DIAGNOSIS — R6520 Severe sepsis without septic shock: Secondary | ICD-10-CM | POA: Diagnosis present

## 2022-07-28 DIAGNOSIS — R0602 Shortness of breath: Secondary | ICD-10-CM | POA: Diagnosis not present

## 2022-07-28 DIAGNOSIS — Z7401 Bed confinement status: Secondary | ICD-10-CM

## 2022-07-28 DIAGNOSIS — N179 Acute kidney failure, unspecified: Secondary | ICD-10-CM | POA: Diagnosis present

## 2022-07-28 DIAGNOSIS — K8020 Calculus of gallbladder without cholecystitis without obstruction: Secondary | ICD-10-CM | POA: Diagnosis present

## 2022-07-28 DIAGNOSIS — E876 Hypokalemia: Secondary | ICD-10-CM | POA: Diagnosis present

## 2022-07-28 DIAGNOSIS — Z1152 Encounter for screening for COVID-19: Secondary | ICD-10-CM

## 2022-07-28 DIAGNOSIS — E669 Obesity, unspecified: Secondary | ICD-10-CM | POA: Diagnosis present

## 2022-07-28 DIAGNOSIS — G9341 Metabolic encephalopathy: Secondary | ICD-10-CM | POA: Diagnosis present

## 2022-07-28 DIAGNOSIS — J439 Emphysema, unspecified: Secondary | ICD-10-CM | POA: Diagnosis present

## 2022-07-28 DIAGNOSIS — L89626 Pressure-induced deep tissue damage of left heel: Secondary | ICD-10-CM | POA: Diagnosis present

## 2022-07-28 DIAGNOSIS — F05 Delirium due to known physiological condition: Secondary | ICD-10-CM | POA: Diagnosis present

## 2022-07-28 DIAGNOSIS — I2510 Atherosclerotic heart disease of native coronary artery without angina pectoris: Secondary | ICD-10-CM | POA: Diagnosis present

## 2022-07-28 DIAGNOSIS — E785 Hyperlipidemia, unspecified: Secondary | ICD-10-CM | POA: Diagnosis present

## 2022-07-28 DIAGNOSIS — Z8701 Personal history of pneumonia (recurrent): Secondary | ICD-10-CM

## 2022-07-28 DIAGNOSIS — F10139 Alcohol abuse with withdrawal, unspecified: Secondary | ICD-10-CM | POA: Diagnosis present

## 2022-07-28 DIAGNOSIS — Z794 Long term (current) use of insulin: Secondary | ICD-10-CM

## 2022-07-28 DIAGNOSIS — J9601 Acute respiratory failure with hypoxia: Secondary | ICD-10-CM | POA: Diagnosis present

## 2022-07-28 DIAGNOSIS — F1014 Alcohol abuse with alcohol-induced mood disorder: Secondary | ICD-10-CM | POA: Diagnosis not present

## 2022-07-28 DIAGNOSIS — Z86711 Personal history of pulmonary embolism: Secondary | ICD-10-CM

## 2022-07-28 DIAGNOSIS — I252 Old myocardial infarction: Secondary | ICD-10-CM

## 2022-07-28 DIAGNOSIS — L89616 Pressure-induced deep tissue damage of right heel: Secondary | ICD-10-CM | POA: Diagnosis present

## 2022-07-28 DIAGNOSIS — J449 Chronic obstructive pulmonary disease, unspecified: Secondary | ICD-10-CM | POA: Diagnosis present

## 2022-07-28 DIAGNOSIS — R109 Unspecified abdominal pain: Secondary | ICD-10-CM | POA: Insufficient documentation

## 2022-07-28 DIAGNOSIS — Z7982 Long term (current) use of aspirin: Secondary | ICD-10-CM

## 2022-07-28 DIAGNOSIS — Z781 Physical restraint status: Secondary | ICD-10-CM

## 2022-07-28 DIAGNOSIS — Z515 Encounter for palliative care: Secondary | ICD-10-CM

## 2022-07-28 DIAGNOSIS — I10 Essential (primary) hypertension: Secondary | ICD-10-CM | POA: Diagnosis present

## 2022-07-28 DIAGNOSIS — F4329 Adjustment disorder with other symptoms: Secondary | ICD-10-CM | POA: Insufficient documentation

## 2022-07-28 DIAGNOSIS — Z7189 Other specified counseling: Secondary | ICD-10-CM

## 2022-07-28 DIAGNOSIS — K529 Noninfective gastroenteritis and colitis, unspecified: Secondary | ICD-10-CM | POA: Diagnosis present

## 2022-07-28 DIAGNOSIS — Z6833 Body mass index (BMI) 33.0-33.9, adult: Secondary | ICD-10-CM

## 2022-07-28 DIAGNOSIS — R4189 Other symptoms and signs involving cognitive functions and awareness: Secondary | ICD-10-CM | POA: Diagnosis present

## 2022-07-28 DIAGNOSIS — R339 Retention of urine, unspecified: Secondary | ICD-10-CM | POA: Diagnosis present

## 2022-07-28 DIAGNOSIS — Z66 Do not resuscitate: Secondary | ICD-10-CM | POA: Diagnosis present

## 2022-07-28 DIAGNOSIS — I4510 Unspecified right bundle-branch block: Secondary | ICD-10-CM | POA: Diagnosis present

## 2022-07-28 DIAGNOSIS — E1140 Type 2 diabetes mellitus with diabetic neuropathy, unspecified: Secondary | ICD-10-CM | POA: Diagnosis present

## 2022-07-28 LAB — COMPREHENSIVE METABOLIC PANEL
ALT: 21 U/L (ref 0–44)
AST: 22 U/L (ref 15–41)
Albumin: 2.3 g/dL — ABNORMAL LOW (ref 3.5–5.0)
Alkaline Phosphatase: 84 U/L (ref 38–126)
Anion gap: 15 (ref 5–15)
BUN: 25 mg/dL — ABNORMAL HIGH (ref 8–23)
CO2: 23 mmol/L (ref 22–32)
Calcium: 7.9 mg/dL — ABNORMAL LOW (ref 8.9–10.3)
Chloride: 97 mmol/L — ABNORMAL LOW (ref 98–111)
Creatinine, Ser: 1.56 mg/dL — ABNORMAL HIGH (ref 0.61–1.24)
GFR, Estimated: 46 mL/min — ABNORMAL LOW (ref >60)
Glucose, Bld: 110 mg/dL — ABNORMAL HIGH (ref 70–99)
Potassium: 3.1 mmol/L — ABNORMAL LOW (ref 3.5–5.1)
Sodium: 135 mmol/L (ref 135–145)
Total Bilirubin: 0.9 mg/dL (ref 0.3–1.2)
Total Protein: 5.8 g/dL — ABNORMAL LOW (ref 6.5–8.1)

## 2022-07-28 LAB — CBC WITH DIFFERENTIAL/PLATELET
Abs Immature Granulocytes: 0.14 10*3/uL — ABNORMAL HIGH (ref 0.00–0.07)
Basophils Absolute: 0 10*3/uL (ref 0.0–0.1)
Basophils Relative: 0 %
Eosinophils Absolute: 0.1 10*3/uL (ref 0.0–0.5)
Eosinophils Relative: 1 %
HCT: 31.3 % — ABNORMAL LOW (ref 39.0–52.0)
Hemoglobin: 10.6 g/dL — ABNORMAL LOW (ref 13.0–17.0)
Immature Granulocytes: 1 %
Lymphocytes Relative: 8 %
Lymphs Abs: 1.6 10*3/uL (ref 0.7–4.0)
MCH: 33.4 pg (ref 26.0–34.0)
MCHC: 33.9 g/dL (ref 30.0–36.0)
MCV: 98.7 fL (ref 80.0–100.0)
Monocytes Absolute: 1.9 10*3/uL — ABNORMAL HIGH (ref 0.1–1.0)
Monocytes Relative: 9 %
Neutro Abs: 16.2 10*3/uL — ABNORMAL HIGH (ref 1.7–7.7)
Neutrophils Relative %: 81 %
Platelets: 226 10*3/uL (ref 150–400)
RBC: 3.17 MIL/uL — ABNORMAL LOW (ref 4.22–5.81)
RDW: 14.2 % (ref 11.5–15.5)
WBC: 19.9 10*3/uL — ABNORMAL HIGH (ref 4.0–10.5)
nRBC: 0 % (ref 0.0–0.2)

## 2022-07-28 LAB — I-STAT VENOUS BLOOD GAS, ED
Acid-Base Excess: 6 mmol/L — ABNORMAL HIGH (ref 0.0–2.0)
Bicarbonate: 28.5 mmol/L — ABNORMAL HIGH (ref 20.0–28.0)
Calcium, Ion: 1.01 mmol/L — ABNORMAL LOW (ref 1.15–1.40)
HCT: 32 % — ABNORMAL LOW (ref 39.0–52.0)
Hemoglobin: 10.9 g/dL — ABNORMAL LOW (ref 13.0–17.0)
O2 Saturation: 98 %
Potassium: 3.1 mmol/L — ABNORMAL LOW (ref 3.5–5.1)
Sodium: 136 mmol/L (ref 135–145)
TCO2: 29 mmol/L (ref 22–32)
pCO2, Ven: 31.2 mmHg — ABNORMAL LOW (ref 44–60)
pH, Ven: 7.568 — ABNORMAL HIGH (ref 7.25–7.43)
pO2, Ven: 96 mmHg — ABNORMAL HIGH (ref 32–45)

## 2022-07-28 LAB — AMMONIA: Ammonia: 18 umol/L (ref 9–35)

## 2022-07-28 LAB — TROPONIN I (HIGH SENSITIVITY)
Troponin I (High Sensitivity): 42 ng/L — ABNORMAL HIGH (ref <18)
Troponin I (High Sensitivity): 41 ng/L — ABNORMAL HIGH (ref <18)

## 2022-07-28 LAB — URINALYSIS, ROUTINE W REFLEX MICROSCOPIC
Bacteria, UA: NONE SEEN
Bilirubin Urine: NEGATIVE
Glucose, UA: NEGATIVE mg/dL
Ketones, ur: 5 mg/dL — AB
Nitrite: NEGATIVE
Protein, ur: 100 mg/dL — AB
Specific Gravity, Urine: 1.027 (ref 1.005–1.030)
WBC, UA: >50 WBC/hpf (ref 0–5)
pH: 5 (ref 5.0–8.0)

## 2022-07-28 LAB — GLUCOSE, CAPILLARY
Glucose-Capillary: 100 mg/dL — ABNORMAL HIGH (ref 70–99)
Glucose-Capillary: 104 mg/dL — ABNORMAL HIGH (ref 70–99)
Glucose-Capillary: 90 mg/dL (ref 70–99)

## 2022-07-28 LAB — RESP PANEL BY RT-PCR (RSV, FLU A&B, COVID)  RVPGX2
Influenza A by PCR: NEGATIVE
Influenza B by PCR: NEGATIVE
Resp Syncytial Virus by PCR: NEGATIVE
SARS Coronavirus 2 by RT PCR: NEGATIVE

## 2022-07-28 LAB — D-DIMER, QUANTITATIVE: D-Dimer, Quant: 2.78 ug/mL-FEU — ABNORMAL HIGH (ref 0.00–0.50)

## 2022-07-28 LAB — BRAIN NATRIURETIC PEPTIDE: B Natriuretic Peptide: 229.7 pg/mL — ABNORMAL HIGH (ref 0.0–100.0)

## 2022-07-28 MED ORDER — ENOXAPARIN SODIUM 120 MG/0.8ML IJ SOSY
110.0000 mg | PREFILLED_SYRINGE | Freq: Two times a day (BID) | INTRAMUSCULAR | Status: DC
  Administered 2022-07-28 – 2022-07-29 (×3): 110 mg via SUBCUTANEOUS
  Filled 2022-07-28 (×3): qty 0.74

## 2022-07-28 MED ORDER — ASPIRIN 81 MG PO CHEW
81.0000 mg | CHEWABLE_TABLET | Freq: Every day | ORAL | Status: DC
  Administered 2022-07-29: 81 mg via ORAL
  Filled 2022-07-28: qty 1

## 2022-07-28 MED ORDER — METOPROLOL TARTRATE 25 MG PO TABS
25.0000 mg | ORAL_TABLET | Freq: Two times a day (BID) | ORAL | Status: DC
  Administered 2022-07-28 – 2022-07-29 (×2): 25 mg via ORAL
  Filled 2022-07-28 (×2): qty 1

## 2022-07-28 MED ORDER — ADULT MULTIVITAMIN W/MINERALS CH: 1.0000 | ORAL_TABLET | Freq: Every day | ORAL | Status: DC

## 2022-07-28 MED ORDER — SODIUM CHLORIDE 0.9% FLUSH
3.0000 mL | Freq: Two times a day (BID) | INTRAVENOUS | Status: DC
  Administered 2022-07-28 – 2022-07-30 (×4): 3 mL via INTRAVENOUS

## 2022-07-28 MED ORDER — FOLIC ACID 1 MG PO TABS
1.0000 mg | ORAL_TABLET | Freq: Every day | ORAL | Status: DC
  Administered 2022-07-28: 1 mg via ORAL
  Filled 2022-07-28: qty 1

## 2022-07-28 MED ORDER — POLYETHYLENE GLYCOL 3350 17 G PO PACK: 17.0000 g | PACK | Freq: Every day | ORAL | Status: DC | PRN

## 2022-07-28 MED ORDER — ACETAMINOPHEN 325 MG PO TABS
650.0000 mg | ORAL_TABLET | Freq: Four times a day (QID) | ORAL | Status: DC | PRN
  Administered 2022-07-29: 650 mg via ORAL
  Filled 2022-07-28: qty 2

## 2022-07-28 MED ORDER — ALBUTEROL SULFATE (2.5 MG/3ML) 0.083% IN NEBU
2.5000 mg | INHALATION_SOLUTION | Freq: Once | RESPIRATORY_TRACT | Status: AC
  Administered 2022-07-28: 2.5 mg via RESPIRATORY_TRACT
  Filled 2022-07-28: qty 3

## 2022-07-28 MED ORDER — INSULIN ASPART 100 UNIT/ML IJ SOLN: 0.0000 [IU] | INTRAMUSCULAR | Status: DC

## 2022-07-28 MED ORDER — THIAMINE MONONITRATE 100 MG PO TABS: 100.0000 mg | ORAL_TABLET | Freq: Every day | ORAL | Status: DC

## 2022-07-28 MED ORDER — ACETAMINOPHEN 650 MG RE SUPP: 650.0000 mg | Freq: Four times a day (QID) | RECTAL | Status: DC | PRN

## 2022-07-28 MED ORDER — NORTRIPTYLINE HCL 25 MG PO CAPS
25.0000 mg | ORAL_CAPSULE | Freq: Every day | ORAL | Status: DC
  Administered 2022-07-28: 25 mg via ORAL
  Filled 2022-07-28 (×3): qty 1

## 2022-07-28 MED ORDER — SODIUM CHLORIDE 0.9 % IV SOLN: INTRAVENOUS | Status: DC

## 2022-07-28 MED ORDER — IPRATROPIUM BROMIDE 0.02 % IN SOLN
0.5000 mg | Freq: Once | RESPIRATORY_TRACT | Status: AC
  Administered 2022-07-28: 0.5 mg via RESPIRATORY_TRACT
  Filled 2022-07-28: qty 2.5

## 2022-07-28 MED ORDER — SODIUM CHLORIDE 0.9 % IV SOLN
100.0000 mg | Freq: Two times a day (BID) | INTRAVENOUS | Status: DC
  Administered 2022-07-28 – 2022-07-29 (×3): 100 mg via INTRAVENOUS
  Filled 2022-07-28 (×4): qty 100

## 2022-07-28 MED ORDER — ALBUTEROL SULFATE (2.5 MG/3ML) 0.083% IN NEBU: 2.5000 mg | INHALATION_SOLUTION | RESPIRATORY_TRACT | Status: DC | PRN

## 2022-07-28 MED ORDER — ATORVASTATIN CALCIUM 80 MG PO TABS
80.0000 mg | ORAL_TABLET | Freq: Every day | ORAL | Status: DC
  Administered 2022-07-29: 80 mg via ORAL
  Filled 2022-07-28: qty 1

## 2022-07-28 MED ORDER — IOHEXOL 350 MG/ML SOLN
75.0000 mL | Freq: Once | INTRAVENOUS | Status: AC | PRN
  Administered 2022-07-28: 75 mL via INTRAVENOUS

## 2022-07-28 NOTE — H&P (Addendum)
History and Physical   [name] Oneal WUJ:[id number] DOB: 1946-01-11 DOA: 07/28/2022  PCP: Pcp, No   Patient coming from: [facility]  Chief Complaint: Shortness of breath  HPI: [name] Oneal is a 77 y.o. male with medical history significant of CAD status post PCI, hypertension, hyperlipidemia, COPD, diabetes, alcohol use, tobacco withdrawal presenting with shortness of breath.  Patient has been experiencing worsening shortness of breath starting today.  Notably he was not reportedly short of breath at his cardiology follow-up yesterday.   this was noted by staff instability patient was sent to the ED for further evaluation.  Complicated recent medical course.  He was admitted in March with NSTEMI under cardiology service but this was complicated by alcohol withdrawal leading patient to be admitted to the ICU on Precedex and phenobarbital.  In the ICU he experienced an right hemineglect and stroke was ruled out with assistance of neurology.  Also had some respiratory failure presumed to be secondary to COPD and aspiration pneumonia.  This had improved and he was weaned to room air by discharge.  He was discharged to SNF based on PT recommendations and he was able to stand at bedside with assistance on discharge.  Subsequently he presented with close ulcers from facilities on 4/26 and was discharged back to facility.  He had follow-up with cardiology yesterday and was stable at that time but some mild hypotension.  As above now patient requiring 2 to 3 L oxygen, tachycardic, shortness of breath.  Denies fevers, chills, chest pain, abdominal pain, constipation, diarrhea, nausea, vomiting.  Family available at the time of my interview and reports that he had been intermittently on oxygen at facility and was placed back on him recently.  Also he was sent over here due to some intermittent chest pain and abdominal pain with abnormal chest x-ray and abdominal x-ray.  ED Course: Vital  signs in the ED notable for heart rate 100s to 110s, respiratory rate in the 20s, blood pressure in the 120s to 140s.  Requiring 2 to 3 L to maintain saturations.  Lab workup included CMP with potassium 3.1, chloride 97, BUN 25, creatinine 1.56 with baseline of 1, glucose 111, calcium 7.9, protein 5.8, albumin 2.3.  CBC with leukocytosis to 19.9 which is mildly elevated from baseline around 15.  Hemoglobin stable at 10.6. 42 with repeat pending.  Respiratory panel negative for flu COVID and RSV.  BNP borderline at 229.  D-dimer elevated to 2.78.  Ammonia and urinalysis pending.  VBG with pH 7.56 and pCO2 31.2.  Chest x-ray with atelectasis versus consolidation bilaterally.  Mild cardiomegaly.  CTA PE study showing bilateral segmental PE with mild clot burden.  Emphysema changes.  Evidence of old granulomatous disease.  Patient received albuterol and Atrovent in the ED.  Review of Systems: As per HPI otherwise all other systems reviewed and are negative.  Past Medical History:  Diagnosis Date   Acute respiratory failure with hypoxia and hypercapnia (HCC) 06/22/2022   Alcohol withdrawal syndrome, with delirium (HCC) 06/29/2022   Alcoholic (HCC)    Aspiration pneumonia of right lower lobe (HCC) 06/29/2022   Delirium tremens (HCC) 06/23/2022   Hypertension    NSTEMI (non-ST elevated myocardial infarction) (HCC) 06/22/2022   Pneumonia of left lower lobe due to infectious organism 06/30/2022   Unstable angina (HCC) 06/20/2022    History reviewed. No pertinent surgical history.  Social History  reports that he has been smoking. He has never used smokeless tobacco. He reports current alcohol  use. No history on file for drug use.  No Known Allergies  History reviewed. No pertinent family history.  Prior to Admission medications   Medication Sig Start Date End Date Taking? Authorizing Provider  acetaminophen (TYLENOL) 500 MG tablet Take 1,000 mg by mouth every 8 (eight) hours as needed for mild  pain.   Yes [provider]  aspirin 81 MG chewable tablet Chew 1 tablet (81 mg total) by mouth daily. 07/15/22  Yes Pudota, Elsie Ra, MD  atorvastatin (LIPITOR) 80 MG tablet Take 1 tablet (80 mg total) by mouth daily. 07/15/22  Yes Pudota, Elsie Ra, MD  Cephalexin 500 MG tablet Take 500 mg by mouth in the morning and at bedtime.   Yes [provider]  folic acid (FOLVITE) 1 MG tablet Take 1 tablet (1 mg total) by mouth daily. 07/15/22  Yes Pudota, Elsie Ra, MD  furosemide (LASIX) 40 MG tablet Take 1 tablet (40 mg total) by mouth 2 (two) times daily. 07/14/22 08/13/22 Yes Pudota, Elsie Ra, MD  insulin glargine-yfgn (SEMGLEE) 100 UNIT/ML injection Inject 0.08 mLs (8 Units total) into the skin daily. 07/15/22  Yes Pudota, Elsie Ra, MD  lisinopril (ZESTRIL) 20 MG tablet Take 1 tablet (20 mg total) by mouth daily. 07/15/22  Yes Pudota, Elsie Ra, MD  metoprolol tartrate (LOPRESSOR) 25 MG tablet Take 1 tablet (25 mg total) by mouth 2 (two) times daily. 07/14/22 08/13/22 Yes Pudota, Elsie Ra, MD  Multiple Vitamin (MULTIVITAMIN WITH MINERALS) TABS tablet Take 1 tablet by mouth daily. 07/15/22  Yes Pudota, Elsie Ra, MD  nicotine (NICODERM CQ - DOSED IN MG/24 HOURS) 21 mg/24hr patch Place 1 patch (21 mg total) onto the skin daily. 07/15/22  Yes Pudota, Elsie Ra, MD  nortriptyline (PAMELOR) 25 MG capsule Take 25 mg by mouth at bedtime.   Yes [provider]  thiamine (VITAMIN B-1) 100 MG tablet Take 1 tablet (100 mg total) by mouth daily. 07/15/22  Yes Pudota, Elsie Ra, MD  vitamin B-12 (VITAMIN B12) 500 MCG tablet Take 1 tablet (500 mcg total) by mouth daily. 07/15/22  Yes Pudota, Elsie Ra, MD  doxycycline 100 mg in sodium chloride 0.9 % 250 mL Inject 100 mg into the vein every 12 (twelve) hours. 07/28/22   [provider]  gabapentin (NEURONTIN) 300 MG capsule Take 1 capsule (300 mg total) by mouth 2  (two) times daily. Patient not taking: Reported on 07/28/2022 07/14/22 08/13/22  Harold Hedge, MD  HYDROcodone-acetaminophen (NORCO/VICODIN) 5-325 MG tablet Take 2 tablets by mouth every 4 (four) hours as needed for up to 5 days. Patient not taking: Reported on 07/28/2022 07/24/22 07/29/22  Curley Spice, MD  polyethylene glycol (MIRALAX) 17 g packet Take 17 g by mouth daily. Patient not taking: Reported on 07/28/2022 07/24/22   Curley Spice, MD    Physical Exam: Vitals:   07/28/22 1200 07/28/22 1215 07/28/22 1230 07/28/22 1427  BP: 133/73 127/71 136/85 (!) 163/81  Pulse:    88  Resp: (!) 22 (!) 29 (!) 24 (!) 22  Temp:    97.8 F (36.6 C)  TempSrc:    Oral  SpO2:    96%    Physical Exam Constitutional:      General: He is not in acute distress.    Appearance: Normal appearance.  HENT:     Head: Normocephalic and atraumatic.     Mouth/Throat:     Mouth: Mucous membranes are moist.     Pharynx: Oropharynx is clear.  Eyes:  Extraocular Movements: Extraocular movements intact.     Pupils: Pupils are equal, round, and reactive to light.  Cardiovascular:     Rate and Rhythm: Normal rate and regular rhythm.     Pulses: Normal pulses.     Heart sounds: Normal heart sounds.  Pulmonary:     Effort: Pulmonary effort is normal. No respiratory distress.     Breath sounds: Normal breath sounds.  Abdominal:     General: Bowel sounds are normal. There is no distension.     Palpations: Abdomen is soft.     Tenderness: There is no abdominal tenderness.  Musculoskeletal:        General: No swelling or deformity.  Skin:    General: Skin is warm and dry.  Neurological:     General: No focal deficit present.     Mental Status: Mental status is at baseline.     Labs on Admission: I have personally reviewed following labs and imaging studies  CBC: Recent Labs  Lab 07/24/22 1955 07/28/22 0558 07/28/22 0624  WBC 14.7* 19.9*  --   NEUTROABS 11.5* 16.2*  --   HGB 12.2* 10.6*  10.9*  HCT 36.6* 31.3* 32.0*  MCV 101.4* 98.7  --   PLT 241 226  --     Basic Metabolic Panel: Recent Labs  Lab 07/24/22 1955 07/28/22 0558 07/28/22 0624  NA 138 135 136  K 3.7 3.1* 3.1*  CL 98 97*  --   CO2 25 23  --   GLUCOSE 101* 110*  --   BUN 26* 25*  --   CREATININE 1.52* 1.56*  --   CALCIUM 8.9 7.9*  --     GFR: Estimated Creatinine Clearance: 50 mL/min (A) (by C-G formula based on SCr of 1.56 mg/dL (H)).  Liver Function Tests: Recent Labs  Lab 07/24/22 1955 07/28/22 0558  AST 24 22  ALT 25 21  ALKPHOS 74 84  BILITOT 1.2 0.9  PROT 6.9 5.8*  ALBUMIN 2.7* 2.3*    Urine analysis:    Component Value Date/Time   COLORURINE YELLOW 09/16/2017 2044   APPEARANCEUR CLEAR 09/16/2017 2044   LABSPEC 1.014 09/16/2017 2044   PHURINE 5.0 09/16/2017 2044   GLUCOSEU NEGATIVE 09/16/2017 2044   HGBUR NEGATIVE 09/16/2017 2044   BILIRUBINUR NEGATIVE 09/16/2017 2044   KETONESUR NEGATIVE 09/16/2017 2044   PROTEINUR NEGATIVE 09/16/2017 2044   UROBILINOGEN 0.2 08/15/2007 1011   NITRITE NEGATIVE 09/16/2017 2044   LEUKOCYTESUR NEGATIVE 09/16/2017 2044    Radiological Exams on Admission: CT Angio Chest PE W and/or Wo Contrast  Result Date: 07/28/2022 CLINICAL DATA:  Shortness of breath. EXAM: CT ANGIOGRAPHY CHEST WITH CONTRAST TECHNIQUE: Multidetector CT imaging of the chest was performed using the standard protocol during bolus administration of intravenous contrast. Multiplanar CT image reconstructions and MIPs were obtained to evaluate the vascular anatomy. RADIATION DOSE REDUCTION: This exam was performed according to the departmental dose-optimization program which includes automated exposure control, adjustment of the mA and/or kV according to patient size and/or use of iterative reconstruction technique. CONTRAST:  75mL OMNIPAQUE IOHEXOL 350 MG/ML SOLN COMPARISON:  X-ray 07/28/2022. FINDINGS: Cardiovascular: Scattered bilateral pulmonary emboli identified along segmental  vessels. Example right lower lobe series 6, image 99, middle lobe series 6, image 87. A few small emboli on the left as well. Overall mild clot burden. No evidence of right heart strain. The heart itself is enlarged. Coronary artery calcifications are seen. Trace pericardial fluid or thickening. The thoracic aorta has a normal course  and caliber with scattered vascular calcification. There is bovine type aortic arch, a normal variant. Mediastinum/Nodes: Normal caliber thoracic esophagus. Preserved thyroid gland. No specific abnormal lymph node enlargement seen in the axillary region or mediastinum. There are some small bilateral hilar nodes identified which are less than a cm in short axis and not pathologic by size criteria. Calcified right hilar lymph nodes are identified. Lungs/Pleura: Few bilateral calcified lung nodules are identified largest in the right lower lobe consistent with old granulomatous disease. There are some areas of calcified pleural plaques bilaterally. Underlying emphysematous lung changes with interstitial septal thickening and some patchy opacities with dependent in the left upper and bilateral lower lobes. Diffuse breathing motion identified. Upper Abdomen: In the upper abdomen the adrenal glands are preserved but incompletely included in the imaging field. Musculoskeletal: Diffuse degenerative changes of the spine with bridging syndesmophytes and osteophytes. Review of the MIP images confirms the above findings. IMPRESSION: Bilateral segmental pulmonary emboli.  Mild clot burden. Centrilobular emphysematous changes with areas of interstitial septal thickening and dependent atelectasis. Evidence of old granulomatous disease. Enlarged heart. Diffuse breathing motion Critical Value/emergent results were called by telephone at the time of interpretation on 07/28/2022 at 7:56 am to provider [facility] , who verbally acknowledged these results. Aortic Atherosclerosis (ICD10-I70.0) and  Emphysema (ICD10-J43.9). Electronically Signed   By: Karen Kays M.D.   On: 07/28/2022 11:16   DG Chest Port 1 View  Result Date: 07/28/2022 CLINICAL DATA:  77 year old male with history of shortness of breath. EXAM: PORTABLE CHEST 1 VIEW COMPARISON:  Chest x-ray 07/04/2022. FINDINGS: Opacity in the left base obscuring the left hemidiaphragm and left costophrenic sulcus. Ill-defined opacity in the medial aspect of the right lung base also noted. No definite right pleural effusion. No pneumothorax. No evidence of pulmonary edema. Heart size is mildly enlarged. Upper mediastinal contours are within normal limits allowing for patient rotation to the left. Atherosclerotic calcifications are noted in the thoracic aorta. IMPRESSION: 1. Atelectasis and/or consolidation in the lung bases bilaterally (left-greater-than-right) with small to moderate left pleural effusion. 2. Mild cardiomegaly. 3. Aortic atherosclerosis. Electronically Signed   By: Trudie Reed M.D.   On: 07/28/2022 07:21    EKG: Independently reviewed.  Sinus tachycardia 91 bpm.  Right bundle blanch block.  ST depressions versus repolarization.  Similar to previous.  QTc read as elevated at 597.  Assessment/Plan Principal Problem:   Acute pulmonary embolism (HCC) Active Problems:   Essential hypertension   COPD (chronic obstructive pulmonary disease) (HCC)   Alcohol abuse with alcohol-induced mood disorder (HCC)   AKI (acute kidney injury) (HCC)   HLD (hyperlipidemia)   Pre-diabetes   Leukocytosis   Acute cholelithiasis Acute respiratory failure hypoxia > Patient experiencing shortness of breath starting today.  was not short of breath that cardiology visit yesterday, but family states he may have minimized his symptoms.  Reportedly he was only recently started back on oxygen at facility. > Also noted to be tachycardic and tachypneic in the ED.  > CTA PE study positive for bilateral segmental PE with mild clot burden.  BNP  borderline at 229 and troponin 42 with repeat pending.  Likely degree of strain. > Has been largely bedbound with ulcers on his feet.  Immobility is a likely etiology. - Monitor on telemetry - Treatment dose Lovenox - Echocardiogram  Abdominal pain Ileus?, r/o SBO > X-ray performed at facility showed evidence of possible ileus.  Will need to rule out SBO given these findings and acute on  chronic leukocytosis as below.. - CT abdomen pelvis without contrast given recent CT angiogram of the chest. - Sips with meds for now  Leukocytosis > Does have leukocytosis, this has been persistent since last admission.  Baseline around 15 currently 19.  Per records from facility review he was on Keflex and is recently been transition to Doxy twice daily.  Possibly out of concern for developing pneumonia however this was negative on CT here.  Doxy course due to be completed 4/35 so we will complete this given persistent leukocytosis. > CT chest without evidence of infection.  Will check urinalysis and benign his lower extremity wounds (currently no erythema spreading beyond bandaged wounds.). - Trend CBC - Continue previously prescribed doxycycline. - Follow-up urinalysis - WOC consult  AKI > Creatinine elevated to 1.56 baseline 1. - IV fluids overnight -Hold Lasix and lisinopril. - Trend renal function and electrolytes  Cognitive deficit > Patient has waxing and waning confusion ever since his recent admission last month.  Family reports this has been worse when he was on gabapentin. - Hold gabapentin - Continue to monitor  HTN CAD HLD > Recent admission for NSTEMI.  Complicated by alcohol withdrawal and ICU stay as per HPI. > Has history of prior PCI but workup for that NSTEMI was deferred and he was seen outpatient recently and due to his lack of shortness of breath, no chest pain, and overall stability was continued on medical management. - Continue home metoprolol - Continue home aspirin,  atorvastatin - Holding home Lasix and lisinopril as above  COPD > Evidence of emphysema on chest CT. - Not currently on any inhalers - As needed albuterol  Prediabetes > Receiving insulin at facility - SSI   Neuropathy - Holding gabapentin, will continue with nortriptyline  Alcohol use > Recently went through withdrawals during previous admission.  Has been at facility since that time.  Has not been consuming alcohol. - Thiamine, folate, multivitamin  DVT prophylaxis: Treatment dose Lovenox Code Status:   DNR Family Communication:  Updated at bedside Disposition Plan:   Patient is from:  [facility] SNF  Anticipated DC to:  As above  Anticipated DC date:  1 to 2 days  Anticipated DC barriers: None  Consults called:  None Admission status:  Observation, telemetry  Severity of Illness: The appropriate patient status for this patient is OBSERVATION. Observation status is judged to be reasonable and necessary in order to provide the required intensity of service to ensure the patient's safety. The patient's presenting symptoms, physical exam findings, and initial radiographic and laboratory data in the context of their medical condition is felt to place them at decreased risk for further clinical deterioration. Furthermore, it is anticipated that the patient will be medically stable for discharge from the hospital within 2 midnights of admission.    Synetta Fail MD [facility]  How to contact the [facility] Attending or Consulting provider 7A - 7P or covering provider during after hours 7P -7A, for this patient?   Check the care team in [facility] and look for a) attending/consulting [facility] provider listed and b) the [facility] team listed Log into [email or web address] and use [facility]'s universal password to access. If you do not have the password, please contact the hospital operator. Locate the [facility] provider you are looking for under [facility] and page to a number that you can be  directly reached. If you still have difficulty reaching the provider, please page the [facility] (Director on Call) for the Hospitalists listed  on amion for assistance.  07/28/2022, 2:52 PM

## 2022-07-28 NOTE — ED Notes (Signed)
ED TO INPATIENT HANDOFF REPORT  ED Nurse Name and Phone #: Lind Covert Name/Age/Gender [name] Oneal 77 y.o. male Room/Bed: 037C/037C  Code Status   Code Status: Full Code  Home/SNF/Other Skilled nursing facility Patient oriented to: self and place Is this baseline? Yes   Triage Complete: Triage complete  Chief Complaint Acute pulmonary embolism (HCC) [I26.99]  Triage Note Pt bib PTAR from [facility] due to SOB. Pt was diagnosed with pneumonia and is being treated for it per PTAR. 95% 3L [place]    Allergies No Known Allergies  Level of Care/Admitting Diagnosis ED Disposition     ED Disposition  Admit   Condition  --   Comment  Hospital Area: [facility] [[id number]]  Level of Care: Telemetry Medical [[id number]]  May place patient in observation at [facility] or [facility] if equivalent level of care is available:: No  Covid Evaluation: Asymptomatic - no recent exposure (last 10 days) testing not required  Diagnosis: Acute pulmonary embolism [facility]) [[id number]]  Admitting Physician: Synetta Fail [[id number]]  Attending Physician: Synetta Fail [[id number]]          B Medical/Surgery History Past Medical History:  Diagnosis Date   Acute respiratory failure with hypoxia and hypercapnia (HCC) 06/22/2022   Alcohol withdrawal syndrome, 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 No pertinent surgical history.   A IV Location/Drains/Wounds Patient Lines/Drains/Airways Status     Active Line/Drains/Airways     Name Placement date Placement time Site Days   Peripheral IV 07/28/22 22 G Posterior;Right Hand 07/28/22  0524  Hand  less than 1    Peripheral IV 07/28/22 20 G Anterior;Right;Upper Arm 07/28/22  0741  Arm  less than 1   Urethral Catheter [place] RN Double-lumen 16 Fr. 07/28/22  1250  Double-lumen  less than 1   Pressure Injury 06/29/22 Nose Mid Unstageable - Full thickness tissue loss in which the base of the injury is covered by slough (yellow, tan, gray, green or brown) and/or eschar (tan, brown or black) in the wound bed. DTI with eschar from Bipap mask 06/29/22  1100  -- 29   Pressure Injury 06/29/22 Thigh Right;Posterior Deep Tissue Pressure Injury - Purple or maroon localized area of discolored intact skin or blood-filled blister due to damage of underlying soft tissue from pressure and/or shear. 06/29/22  1100  -- 29   Pressure Injury 06/30/22 Heel Left Deep Tissue Pressure Injury - Purple or maroon localized area of discolored intact skin or blood-filled blister due to damage of underlying soft tissue from pressure and/or shear. 06/30/22  --  -- 28   Pressure Injury 06/30/22 Heel Right Deep Tissue Pressure Injury - Purple or maroon localized area of discolored intact skin or blood-filled blister due to damage of underlying soft tissue from pressure and/or shear. 06/30/22  --  -- 28   Pressure Injury 07/02/22 Pretibial Left;Lateral Deep Tissue Pressure Injury - Purple or maroon localized area of discolored intact skin or blood-filled blister due to damage of underlying soft tissue from pressure and/or shear. purple area with serous bl 07/02/22  0700  -- 26            Intake/Output  Last 24 hours No intake or output data in the 24 hours ending 07/28/22 1425  Labs/Imaging Results for orders placed or performed during the hospital encounter of 07/28/22 (from the past 48 hour(s))  Comprehensive metabolic panel     Status: Abnormal   Collection Time: 07/28/22  5:58 AM  Result Value Ref Range   Sodium 135 135 - 145 mmol/L   Potassium 3.1 (L) 3.5 - 5.1 mmol/L   Chloride 97 (L) 98 - 111 mmol/L   CO2 23 22 - 32 mmol/L    Glucose, Bld 110 (H) 70 - 99 mg/dL    Comment: Glucose reference range applies only to samples taken after fasting for at least 8 hours.   BUN 25 (H) 8 - 23 mg/dL   Creatinine, Ser 1.61 (H) 0.61 - 1.24 mg/dL   Calcium 7.9 (L) 8.9 - 10.3 mg/dL   Total Protein 5.8 (L) 6.5 - 8.1 g/dL   Albumin 2.3 (L) 3.5 - 5.0 g/dL   AST 22 15 - 41 U/L   ALT 21 0 - 44 U/L   Alkaline Phosphatase 84 38 - 126 U/L   Total Bilirubin 0.9 0.3 - 1.2 mg/dL   GFR, Estimated 46 (L) >60 mL/min    Comment: (NOTE) Calculated using the CKD-EPI Creatinine Equation (2021)    Anion gap 15 5 - 15    Comment: Performed at [facility], [address], [place], [place] [address]  CBC with Differential/Platelet     Status: Abnormal   Collection Time: 07/28/22  5:58 AM  Result Value Ref Range   WBC 19.9 (H) 4.0 - 10.5 K/uL   RBC 3.17 (L) 4.22 - 5.81 MIL/uL   Hemoglobin 10.6 (L) 13.0 - 17.0 g/dL   HCT 54.0 (L) 98.1 - 19.1 %   MCV 98.7 80.0 - 100.0 fL   MCH 33.4 26.0 - 34.0 pg   MCHC 33.9 30.0 - 36.0 g/dL   RDW 47.8 29.5 - 62.1 %   Platelets 226 150 - 400 K/uL   nRBC 0.0 0.0 - 0.2 %   Neutrophils Relative % 81 %   Neutro Abs 16.2 (H) 1.7 - 7.7 K/uL   Lymphocytes Relative 8 %   Lymphs Abs 1.6 0.7 - 4.0 K/uL   Monocytes Relative 9 %   Monocytes Absolute 1.9 (H) 0.1 - 1.0 K/uL   Eosinophils Relative 1 %   Eosinophils Absolute 0.1 0.0 - 0.5 K/uL   Basophils Relative 0 %   Basophils Absolute 0.0 0.0 - 0.1 K/uL   Immature Granulocytes 1 %   Abs Immature Granulocytes 0.14 (H) 0.00 - 0.07 K/uL    Comment: Performed at [facility], [address] [place], [place] [address]  Brain natriuretic peptide     Status: Abnormal   Collection Time: 07/28/22  5:58 AM  Result Value Ref Range   B Natriuretic Peptide 229.7 (H) 0.0 - 100.0 pg/mL    Comment: Performed at [facility], [address], [place], [place] [address]  D-dimer, quantitative     Status: Abnormal   Collection Time: 07/28/22  5:58 AM   Result Value Ref Range   D-Dimer, Quant 2.78 (H) 0.00 - 0.50 ug/mL-FEU    Comment: (NOTE) At the manufacturer cut-off value of 0.5 g/mL FEU, this assay has a negative predictive value of 95-100%.This assay is intended for use in conjunction with a clinical pretest probability (PTP) assessment model to exclude pulmonary embolism (PE) and deep venous thrombosis (DVT) in  outpatients suspected of PE or DVT. Results should be correlated with clinical presentation. Performed at [facility], [address] [place], [place] [address]   Troponin I (High Sensitivity)     Status: Abnormal   Collection Time: 07/28/22  5:58 AM  Result Value Ref Range   Troponin I (High Sensitivity) 42 (H) <18 ng/L    Comment: (NOTE) Elevated high sensitivity troponin I (hsTnI) values and significant  changes across serial measurements may suggest ACS but many other  chronic and acute conditions are known to elevate hsTnI results.  Refer to the "Links" section for chest pain algorithms and additional  guidance. Performed at [facility], [address] [place], [place] [address]   Resp panel by RT-PCR (RSV, Flu A&B, Covid) Anterior Nasal Swab     Status: None   Collection Time: 07/28/22  6:06 AM   Specimen: Anterior Nasal Swab  Result Value Ref Range   SARS Coronavirus 2 by RT PCR NEGATIVE NEGATIVE   Influenza A by PCR NEGATIVE NEGATIVE   Influenza B by PCR NEGATIVE NEGATIVE    Comment: (NOTE) The Xpert Xpress SARS-CoV-2/FLU/RSV plus assay is intended as an aid in the diagnosis of influenza from Nasopharyngeal swab specimens and should not be used as a sole basis for treatment. Nasal washings and aspirates are unacceptable for Xpert Xpress SARS-CoV-2/FLU/RSV testing.  Fact Sheet for Patients: BloggerCourse.com  Fact Sheet for Healthcare Providers: SeriousBroker.it  This test is not yet approved or cleared by the [place] FDA  and has been authorized for detection and/or diagnosis of SARS-CoV-2 by FDA under an Emergency Use Authorization (EUA). This EUA will remain in effect (meaning this test can be used) for the duration of the COVID-19 declaration under Section 564(b)(1) of the Act, 21 U.S.C. section 360bbb-3(b)(1), unless the authorization is terminated or revoked.     Resp Syncytial Virus by PCR NEGATIVE NEGATIVE    Comment: (NOTE) Fact Sheet for Patients: BloggerCourse.com  Fact Sheet for Healthcare Providers: SeriousBroker.it  This test is not yet approved or cleared by the [place] FDA and has been authorized for detection and/or diagnosis of SARS-CoV-2 by FDA under an Emergency Use Authorization (EUA). This EUA will remain in effect (meaning this test can be used) for the duration of the COVID-19 declaration under Section 564(b)(1) of the Act, 21 U.S.C. section 360bbb-3(b)(1), unless the authorization is terminated or revoked.  Performed at [facility], [address] [place], [place] [address]   I-Stat venous blood gas, ED     Status: Abnormal   Collection Time: 07/28/22  6:24 AM  Result Value Ref Range   pH, Ven 7.568 (H) 7.25 - 7.43   pCO2, Ven 31.2 (L) 44 - 60 mmHg   pO2, Ven 96 (H) 32 - 45 mmHg   Bicarbonate 28.5 (H) 20.0 - 28.0 mmol/L   TCO2 29 22 - 32 mmol/L   O2 Saturation 98 %   Acid-Base Excess 6.0 (H) 0.0 - 2.0 mmol/L   Sodium 136 135 - 145 mmol/L   Potassium 3.1 (L) 3.5 - 5.1 mmol/L   Calcium, Ion 1.01 (L) 1.15 - 1.40 mmol/L   HCT 32.0 (L) 39.0 - 52.0 %   Hemoglobin 10.9 (L) 13.0 - 17.0 g/dL   Sample type VENOUS   Troponin I (High Sensitivity)     Status: Abnormal   Collection Time: 07/28/22  8:13 AM  Result Value Ref Range   Troponin I (High Sensitivity) 41 (H) <18 ng/L  Comment: (NOTE) Elevated high sensitivity troponin I (hsTnI) values and significant  changes across serial measurements may suggest ACS  but many other  chronic and acute conditions are known to elevate hsTnI results.  Refer to the "Links" section for chest pain algorithms and additional  guidance. Performed at [facility], [address] [place], [place] [address]    CT Angio Chest PE W and/or Wo Contrast  Result Date: 07/28/2022 CLINICAL DATA:  Shortness of breath. EXAM: CT ANGIOGRAPHY CHEST WITH CONTRAST TECHNIQUE: Multidetector CT imaging of the chest was performed using the standard protocol during bolus administration of intravenous contrast. Multiplanar CT image reconstructions and MIPs were obtained to evaluate the vascular anatomy. RADIATION DOSE REDUCTION: This exam was performed according to the departmental dose-optimization program which includes automated exposure control, adjustment of the mA and/or kV according to patient size and/or use of iterative reconstruction technique. CONTRAST:  75mL OMNIPAQUE IOHEXOL 350 MG/ML SOLN COMPARISON:  X-ray 07/28/2022. FINDINGS: Cardiovascular: Scattered bilateral pulmonary emboli identified along segmental vessels. Example right lower lobe series 6, image 99, middle lobe series 6, image 87. A few small emboli on the left as well. Overall mild clot burden. No evidence of right heart strain. The heart itself is enlarged. Coronary artery calcifications are seen. Trace pericardial fluid or thickening. The thoracic aorta has a normal course and caliber with scattered vascular calcification. There is bovine type aortic arch, a normal variant. Mediastinum/Nodes: Normal caliber thoracic esophagus. Preserved thyroid gland. No specific abnormal lymph node enlargement seen in the axillary region or mediastinum. There are some small bilateral hilar nodes identified which are less than a cm in short axis and not pathologic by size criteria. Calcified right hilar lymph nodes are identified. Lungs/Pleura: Few bilateral calcified lung nodules are identified largest in the right lower lobe  consistent with old granulomatous disease. There are some areas of calcified pleural plaques bilaterally. Underlying emphysematous lung changes with interstitial septal thickening and some patchy opacities with dependent in the left upper and bilateral lower lobes. Diffuse breathing motion identified. Upper Abdomen: In the upper abdomen the adrenal glands are preserved but incompletely included in the imaging field. Musculoskeletal: Diffuse degenerative changes of the spine with bridging syndesmophytes and osteophytes. Review of the MIP images confirms the above findings. IMPRESSION: Bilateral segmental pulmonary emboli.  Mild clot burden. Centrilobular emphysematous changes with areas of interstitial septal thickening and dependent atelectasis. Evidence of old granulomatous disease. Enlarged heart. Diffuse breathing motion Critical Value/emergent results were called by telephone at the time of interpretation on 07/28/2022 at 7:56 am to provider [facility] , who verbally acknowledged these results. Aortic Atherosclerosis (ICD10-I70.0) and Emphysema (ICD10-J43.9). Electronically Signed   By: Karen Kays M.D.   On: 07/28/2022 11:16   DG Chest Port 1 View  Result Date: 07/28/2022 CLINICAL DATA:  77 year old male with history of shortness of breath. EXAM: PORTABLE CHEST 1 VIEW COMPARISON:  Chest x-ray 07/04/2022. FINDINGS: Opacity in the left base obscuring the left hemidiaphragm and left costophrenic sulcus. Ill-defined opacity in the medial aspect of the right lung base also noted. No definite right pleural effusion. No pneumothorax. No evidence of pulmonary edema. Heart size is mildly enlarged. Upper mediastinal contours are within normal limits allowing for patient rotation to the left. Atherosclerotic calcifications are noted in the thoracic aorta. IMPRESSION: 1. Atelectasis and/or consolidation in the lung bases bilaterally (left-greater-than-right) with small to moderate left pleural effusion. 2. Mild  cardiomegaly. 3. Aortic atherosclerosis. Electronically Signed   By: Trudie Reed  M.D.   On: 07/28/2022 07:21    Pending Labs Unresulted Labs (From admission, onward)     Start     Ordered   07/29/22 0500  Basic metabolic panel  Tomorrow morning,   R        07/28/22 1238   07/29/22 0500  CBC  Tomorrow morning,   R        07/28/22 1238   07/28/22 1203  Urinalysis, Routine w reflex microscopic -Urine, Catheterized  Once,   URGENT       Question:  Specimen Source  Answer:  Urine, Catheterized   07/28/22 1202   07/28/22 1133  Ammonia  Once,   STAT        07/28/22 1132   07/28/22 0607  Blood gas, venous  Once,   R        07/28/22 0606            Vitals/Pain Today's Vitals   07/28/22 1145 07/28/22 1200 07/28/22 1215 07/28/22 1230  BP: 136/72 133/73 127/71 136/85  Pulse:      Resp: (!) 24 (!) 22 (!) 29 (!) 24  Temp:      TempSrc:      SpO2:      PainSc:        Isolation Precautions No active isolations  Medications Medications  sodium chloride flush (NS) 0.9 % injection 3 mL (3 mLs Intravenous Given 07/28/22 1326)  0.9 %  sodium chloride infusion (has no administration in time range)  acetaminophen (TYLENOL) tablet 650 mg (has no administration in time range)    Or  acetaminophen (TYLENOL) suppository 650 mg (has no administration in time range)  polyethylene glycol (MIRALAX / GLYCOLAX) packet 17 g (has no administration in time range)  enoxaparin (LOVENOX) injection 110 mg (110 mg Subcutaneous Given 07/28/22 1324)  albuterol (PROVENTIL) (2.5 MG/3ML) 0.083% nebulizer solution 2.5 mg (2.5 mg Nebulization Given 07/28/22 4098)  ipratropium (ATROVENT) nebulizer solution 0.5 mg (0.5 mg Nebulization Given 07/28/22 0614)  iohexol (OMNIPAQUE) 350 MG/ML injection 75 mL (75 mLs Intravenous Contrast Given 07/28/22 1050)    Mobility walks with device     Focused Assessments Pulmonary Assessment Handoff:  Lung sounds: Bilateral Breath Sounds: Diminished O2 Device: Nasal  Cannula O2 Flow Rate (L/min): 3 L/min    R Recommendations: See Admitting Provider Note  Report given to:   Additional Notes:  CLINICAL DATA:  77 year old male with history of shortness of breath. EXAM: PORTABLE CHEST 1 VIEW COMPARISON:  Chest x-ray 07/04/2022. FINDINGS: Opacity in the left base obscuring the left hemidiaphragm and left costophrenic sulcus. Ill-defined opacity in the medial aspect of the right lung base also noted. No definite right pleural effusion. No pneumothorax. No evidence of pulmonary edema. Heart size is mildly enlarged. Upper mediastinal contours are within normal limits allowing for patient rotation to the left. Atherosclerotic calcifications are noted in the thoracic aorta. IMPRESSION: 1. Atelectasis and/or consolidation in the lung bases bilaterally (left-greater-than-right) with small to moderate left pleural effusion. 2. Mild cardiomegaly. 3. Aortic atherosclerosis. Electronically Signed

## 2022-07-28 NOTE — ED Provider Notes (Signed)
[facility] [facility] AT [facility] Provider Note  CSN: [id number] Arrival date & time: 07/28/22 4098  Chief Complaint(s) Shortness of Breath  HPI [name] Oneal is a 77 y.o. male with a past medical history listed below including COPD not previously on supplemental oxygen but now requiring 3 L nasal cannula as of 1 month, aspiration pneumonia 3 weeks ago, prior NSTEMI here from skilled nursing facility for reported shortness of breath.  Patient reports improved symptoms since arriving.  No chest pain.  No nausea or vomiting.  No change in his cough.  Patient was brought in by [facility] and sat remained stable on 3 L nasal cannula.  No intervention given and route.  HPI  Past Medical History Past Medical History:  Diagnosis Date   Alcoholic (HCC)    Hypertension    Patient Active Problem List   Diagnosis Date Noted   AKI (acute kidney injury) (HCC) 07/01/2022   Moderate protein-energy malnutrition (HCC) 06/30/2022   Pneumonia of left lower lobe due to infectious organism 06/30/2022   Hypernatremia 06/29/2022   Aspiration pneumonia of right lower lobe (HCC) 06/29/2022   Alcohol withdrawal syndrome, with delirium (HCC) 06/29/2022   Delirium tremens (HCC) 06/23/2022   Hyperglycemia 06/23/2022   Acute respiratory failure with hypoxia and hypercapnia (HCC) 06/22/2022   COPD with acute exacerbation (HCC) 06/22/2022   Acute metabolic encephalopathy 06/22/2022   NSTEMI (non-ST elevated myocardial infarction) (HCC) 06/22/2022   Unstable angina (HCC) 06/20/2022   Alcohol abuse with alcohol-induced mood disorder (HCC) 09/17/2017   HYPERTENSION, UNSPECIFIED 07/19/2008   BRONCHITIS, CHRONIC 07/19/2008   Home Medication(s) Prior to Admission medications   Medication Sig Start Date End Date Taking? Authorizing Provider  aspirin 81 MG chewable tablet Chew 1 tablet (81 mg total) by mouth daily. 07/15/22   Harold Hedge, MD  atorvastatin (LIPITOR) 80 MG  tablet Take 1 tablet (80 mg total) by mouth daily. 07/15/22   Harold Hedge, MD  Cephalexin 500 MG tablet Take 500 mg by mouth in the morning and at bedtime.    [provider]  folic acid (FOLVITE) 1 MG tablet Take 1 tablet (1 mg total) by mouth daily. 07/15/22   Harold Hedge, MD  furosemide (LASIX) 40 MG tablet Take 1 tablet (40 mg total) by mouth 2 (two) times daily. 07/14/22 08/13/22  Harold Hedge, MD  gabapentin (NEURONTIN) 300 MG capsule Take 1 capsule (300 mg total) by mouth 2 (two) times daily. Patient taking differently: Take 300 mg by mouth 3 (three) times daily. 07/14/22 08/13/22  Harold Hedge, MD  HYDROcodone-acetaminophen (NORCO/VICODIN) 5-325 MG tablet Take 2 tablets by mouth every 4 (four) hours as needed for up to 5 days. 07/24/22 07/29/22  Curley Spice, MD  insulin glargine-yfgn (SEMGLEE) 100 UNIT/ML injection Inject 0.08 mLs (8 Units total) into the skin daily. 07/15/22   Harold Hedge, MD  lisinopril (ZESTRIL) 20 MG tablet Take 1 tablet (20 mg total) by mouth daily. 07/15/22   Harold Hedge, MD  metoprolol tartrate (LOPRESSOR) 25 MG tablet Take 1 tablet (25 mg total) by mouth 2 (two) times daily. 07/14/22 08/13/22  Harold Hedge, MD  Multiple Vitamin (MULTIVITAMIN WITH MINERALS) TABS tablet Take 1 tablet by mouth daily. 07/15/22   Pud[place], Elsie Ra, MD  nicotine (NICODERM CQ - DOSED IN MG/24 HOURS) 21 mg/24hr patch Place 1 patch (21 mg total) onto the skin daily. 07/15/22   Harold Hedge, MD  nortriptyline (PAMELOR) 25 MG capsule  Take 25 mg by mouth 2 (two) times daily.    [provider]  polyethylene glycol (MIRALAX) 17 g packet Take 17 g by mouth daily. 07/24/22   Curley Spice, MD  thiamine (VITAMIN B-1) 100 MG tablet Take 1 tablet (100 mg total) by mouth daily. 07/15/22   Harold Hedge, MD  vitamin B-12 (VITAMIN B12) 500 MCG tablet Take 1 tablet (500 mcg total) by mouth daily.  07/15/22   Harold Hedge, MD                                                                                                                                    Allergies Patient has no known allergies.  Review of Systems Review of Systems As noted in HPI  Physical Exam Vital Signs  I have reviewed the triage vital signs BP 120/63   Pulse (!) 110   Temp 97.6 F (36.4 C) (Oral)   Resp (!) 28   SpO2 95%   Physical Exam Vitals reviewed.  Constitutional:      General: He is not in acute distress.    Appearance: He is well-developed. He is not diaphoretic.  HENT:     Head: Normocephalic and atraumatic.     Nose: Nose normal.  Eyes:     General: No scleral icterus.       Right eye: No discharge.        Left eye: No discharge.     Conjunctiva/sclera: Conjunctivae normal.     Pupils: Pupils are equal, round, and reactive to light.  Cardiovascular:     Rate and Rhythm: Normal rate and regular rhythm.     Heart sounds: No murmur heard.    No friction rub. No gallop.  Pulmonary:     Effort: Pulmonary effort is normal. No respiratory distress.     Breath sounds: Normal breath sounds. No stridor. No rales.  Abdominal:     General: There is no distension.     Palpations: Abdomen is soft.     Tenderness: There is no abdominal tenderness.  Musculoskeletal:        General: No tenderness.     Cervical back: Normal range of motion and neck supple.       Feet:  Skin:    General: Skin is warm and dry.     Findings: No erythema or rash.  Neurological:     Mental Status: He is alert and oriented to person, place, and time.     ED Results and Treatments Labs (all labs ordered are listed, but only abnormal results are displayed) Labs Reviewed  COMPREHENSIVE METABOLIC PANEL - Abnormal; Notable for the following components:      Result Value   Potassium 3.1 (*)    Chloride 97 (*)    Glucose, Bld 110 (*)    BUN 25 (*)    Creatinine, Ser 1.56 (*)    Calcium 7.9 (*)  Total Protein 5.8 (*)    Albumin 2.3 (*)    GFR, Estimated 46 (*)    All other components within normal limits  CBC WITH DIFFERENTIAL/PLATELET - Abnormal; Notable for the following components:   WBC 19.9 (*)    RBC 3.17 (*)    Hemoglobin 10.6 (*)    HCT 31.3 (*)    Neutro Abs 16.2 (*)    Monocytes Absolute 1.9 (*)    Abs Immature Granulocytes 0.14 (*)    All other components within normal limits  BRAIN NATRIURETIC PEPTIDE - Abnormal; Notable for the following components:   B Natriuretic Peptide 229.7 (*)    All other components within normal limits  D-DIMER, QUANTITATIVE - Abnormal; Notable for the following components:   D-Dimer, Quant 2.78 (*)    All other components within normal limits  I-STAT VENOUS BLOOD GAS, ED - Abnormal; Notable for the following components:   pH, Ven 7.568 (*)    pCO2, Ven 31.2 (*)    pO2, Ven 96 (*)    Bicarbonate 28.5 (*)    Acid-Base Excess 6.0 (*)    Potassium 3.1 (*)    Calcium, Ion 1.01 (*)    HCT 32.0 (*)    Hemoglobin 10.9 (*)    All other components within normal limits  TROPONIN I (HIGH SENSITIVITY) - Abnormal; Notable for the following components:   Troponin I (High Sensitivity) 42 (*)    All other components within normal limits  RESP PANEL BY RT-PCR (RSV, FLU A&B, COVID)  RVPGX2  BLOOD GAS, VENOUS  TROPONIN I (HIGH SENSITIVITY)                                                                                                                         EKG  EKG Interpretation  Date/Time:  Tuesday July 28 2022 05:56:19 EDT Ventricular Rate:  99 PR Interval:  99 QRS Duration: 195 QT Interval:  465 QTC Calculation: 597 R Axis:   106 Text Interpretation: Sinus tachycardia Multiple premature complexes, vent & supraven Right bundle branch block ST depression, consider ischemia, diffuse lds No significant change was found Confirmed by Drema Pry [phone]) on 07/28/2022 6:13:48 AM       Radiology DG Chest Port 1 View  Result Date:  07/28/2022 CLINICAL DATA:  77 year old male with history of shortness of breath. EXAM: PORTABLE CHEST 1 VIEW COMPARISON:  Chest x-ray 07/04/2022. FINDINGS: Opacity in the left base obscuring the left hemidiaphragm and left costophrenic sulcus. Ill-defined opacity in the medial aspect of the right lung base also noted. No definite right pleural effusion. No pneumothorax. No evidence of pulmonary edema. Heart size is mildly enlarged. Upper mediastinal contours are within normal limits allowing for patient rotation to the left. Atherosclerotic calcifications are noted in the thoracic aorta. IMPRESSION: 1. Atelectasis and/or consolidation in the lung bases bilaterally (left-greater-than-right) with small to moderate left pleural effusion. 2. Mild cardiomegaly. 3. Aortic atherosclerosis. Electronically Signed   By: Brayton Mars.D.  On: 07/28/2022 07:21    Medications Ordered in ED Medications  albuterol (PROVENTIL) (2.5 MG/3ML) 0.083% nebulizer solution 2.5 mg (2.5 mg Nebulization Given 07/28/22 0614)  ipratropium (ATROVENT) nebulizer solution 0.5 mg (0.5 mg Nebulization Given 07/28/22 1610)                                                                                                                                     Procedures Procedures  (including critical care time)  Medical Decision Making / ED Course  Click here for ABCD2, HEART and other calculators  Medical Decision Making Amount and/or Complexity of Data Reviewed Labs: ordered. Radiology: ordered.  Risk Prescription drug management.    This patient presents to the ED for concern of SOB, this involves an extensive number of treatment options, and is a complaint that carries with it a high risk of complications and morbidity. The differential diagnosis includes but not limited to:  COPD exacerbation, pneumonia, pneumothorax, PE, atypical ACS/unstable angina   Initial intervention:  DuoNeb  Work up Interpretation and  Management:  Cardiac Monitoring/EKG: EKG with sinus tachycardia.  No significant ischemic changes, dysrhythmias or blocks.  Laboratory Tests ordered listed below with my independent interpretation: CBC with leukocytosis.  Anemia of 10.6 which is 1-1/2 g drop from most recent. Metabolic panel with mild hypokalemia.  No other significant electrolyte derangements.  Renal insufficiency with stable function from 4 days ago. VBG without evidence of hypercarbia/respiratory acidosis BNP 230 Trop 42. Down from recent admission for NSTEMI Dimer positive Will need CTA COVID/influenza negative   Imaging Studies ordered listed below with my independent interpretation: Chest x-ray with bibasilar atelectasis versus pneumonia CTA pending     Patient care turned over to oncoming provider. Patient case and results discussed in detail; please see their note for further ED managment.        Final Clinical Impression(s) / ED Diagnoses Final diagnoses:  None           This chart was dictated using voice recognition software.  Despite best efforts to proofread,  errors can occur which can change the documentation meaning.    Nira Conn, MD 07/28/22 [phone]

## 2022-07-28 NOTE — ED Provider Notes (Signed)
  Physical Exam  BP (!) 140/79   Pulse (!) 54   Temp 98.2 F (36.8 C) (Oral)   Resp (!) 21   SpO2 96%   Physical Exam  Procedures  Procedures  ED Course / MDM    Medical Decision Making Amount and/or Complexity of Data Reviewed Labs: ordered. Radiology: ordered.  Risk Prescription drug management.   Received patient is shortness of breath.  From nursing home.  History of COPD and CHF/cardiac disease.  D-dimer positive.  CT scan done and does show bilateral pulmonary embolisms.  Somewhat unsure of chronicity however.  Also urinary retention.  Also confusion.  Will admit to internal medicine for overall worsening condition.       Benjiman Core, MD 07/28/22 1158

## 2022-07-28 NOTE — ED Triage Notes (Addendum)
Pt bib [facility] from [facility] due to SOB. Pt was diagnosed with pneumonia and is being treated for it per PTAR. 95% 3L [place]

## 2022-07-28 NOTE — Progress Notes (Signed)
   07/28/22 1503  Assess: MEWS Score  Temp 98.7 F (37.1 C)  BP 103/74  MAP (mmHg) 85  Pulse Rate (!) 119  ECG Heart Rate (!) 107  Resp (!) 22  SpO2 92 %  O2 Device Nasal Cannula  O2 Flow Rate (L/min) 3 L/min  Assess: MEWS Score  MEWS Temp 0  MEWS Systolic 0  MEWS Pulse 1  MEWS RR 1  MEWS LOC 0  MEWS Score 2  MEWS Score Color Yellow  Assess: if the MEWS score is Yellow or Red  Were vital signs taken at a resting state? Yes  Focused Assessment No change from prior assessment  Does the patient meet 2 or more of the SIRS criteria? Yes  Does the patient have a confirmed or suspected source of infection? Yes  Provider and Rapid Response Notified? No  MEWS guidelines implemented  Yes, yellow  Treat  MEWS Interventions Considered administering scheduled or prn medications/treatments as ordered  Take Vital Signs  Increase Vital Sign Frequency  Yellow: Q2hr x1, continue Q4hrs until patient remains green for 12hrs  Escalate  MEWS: Escalate Yellow: Discuss with charge nurse and consider notifying provider and/or RRT  Notify: Charge Nurse/RN  Name of Charge Nurse/RN Notified Devin  Assess: SIRS CRITERIA  SIRS Temperature  0  SIRS Pulse 1  SIRS Respirations  1  SIRS WBC 1  SIRS Score Sum  3

## 2022-07-29 ENCOUNTER — Observation Stay (HOSPITAL_COMMUNITY): Payer: Medicare PPO

## 2022-07-29 DIAGNOSIS — Z515 Encounter for palliative care: Secondary | ICD-10-CM | POA: Diagnosis not present

## 2022-07-29 DIAGNOSIS — N179 Acute kidney failure, unspecified: Secondary | ICD-10-CM | POA: Diagnosis present

## 2022-07-29 DIAGNOSIS — R0602 Shortness of breath: Secondary | ICD-10-CM | POA: Diagnosis present

## 2022-07-29 DIAGNOSIS — Z66 Do not resuscitate: Secondary | ICD-10-CM | POA: Diagnosis present

## 2022-07-29 DIAGNOSIS — F10139 Alcohol abuse with withdrawal, unspecified: Secondary | ICD-10-CM | POA: Diagnosis present

## 2022-07-29 DIAGNOSIS — A419 Sepsis, unspecified organism: Secondary | ICD-10-CM | POA: Diagnosis present

## 2022-07-29 DIAGNOSIS — Z781 Physical restraint status: Secondary | ICD-10-CM | POA: Diagnosis not present

## 2022-07-29 DIAGNOSIS — I10 Essential (primary) hypertension: Secondary | ICD-10-CM | POA: Diagnosis present

## 2022-07-29 DIAGNOSIS — R4189 Other symptoms and signs involving cognitive functions and awareness: Secondary | ICD-10-CM | POA: Diagnosis not present

## 2022-07-29 DIAGNOSIS — R6520 Severe sepsis without septic shock: Secondary | ICD-10-CM | POA: Diagnosis present

## 2022-07-29 DIAGNOSIS — E876 Hypokalemia: Secondary | ICD-10-CM | POA: Diagnosis present

## 2022-07-29 DIAGNOSIS — E1140 Type 2 diabetes mellitus with diabetic neuropathy, unspecified: Secondary | ICD-10-CM | POA: Diagnosis present

## 2022-07-29 DIAGNOSIS — R1084 Generalized abdominal pain: Secondary | ICD-10-CM | POA: Diagnosis not present

## 2022-07-29 DIAGNOSIS — J439 Emphysema, unspecified: Secondary | ICD-10-CM | POA: Diagnosis present

## 2022-07-29 DIAGNOSIS — Z1152 Encounter for screening for COVID-19: Secondary | ICD-10-CM | POA: Diagnosis not present

## 2022-07-29 DIAGNOSIS — L89616 Pressure-induced deep tissue damage of right heel: Secondary | ICD-10-CM | POA: Diagnosis present

## 2022-07-29 DIAGNOSIS — I2699 Other pulmonary embolism without acute cor pulmonale: Secondary | ICD-10-CM

## 2022-07-29 DIAGNOSIS — G9341 Metabolic encephalopathy: Secondary | ICD-10-CM

## 2022-07-29 DIAGNOSIS — Z6833 Body mass index (BMI) 33.0-33.9, adult: Secondary | ICD-10-CM | POA: Diagnosis not present

## 2022-07-29 DIAGNOSIS — Z7189 Other specified counseling: Secondary | ICD-10-CM | POA: Diagnosis not present

## 2022-07-29 DIAGNOSIS — E785 Hyperlipidemia, unspecified: Secondary | ICD-10-CM | POA: Diagnosis present

## 2022-07-29 DIAGNOSIS — F05 Delirium due to known physiological condition: Secondary | ICD-10-CM | POA: Diagnosis present

## 2022-07-29 DIAGNOSIS — I2510 Atherosclerotic heart disease of native coronary artery without angina pectoris: Secondary | ICD-10-CM | POA: Diagnosis present

## 2022-07-29 DIAGNOSIS — E669 Obesity, unspecified: Secondary | ICD-10-CM | POA: Diagnosis present

## 2022-07-29 DIAGNOSIS — F1014 Alcohol abuse with alcohol-induced mood disorder: Secondary | ICD-10-CM | POA: Diagnosis present

## 2022-07-29 DIAGNOSIS — L89626 Pressure-induced deep tissue damage of left heel: Secondary | ICD-10-CM | POA: Diagnosis present

## 2022-07-29 DIAGNOSIS — J9601 Acute respiratory failure with hypoxia: Secondary | ICD-10-CM | POA: Diagnosis present

## 2022-07-29 DIAGNOSIS — Z794 Long term (current) use of insulin: Secondary | ICD-10-CM | POA: Diagnosis not present

## 2022-07-29 LAB — CBC
HCT: 33.2 % — ABNORMAL LOW (ref 39.0–52.0)
Hemoglobin: 10.8 g/dL — ABNORMAL LOW (ref 13.0–17.0)
MCH: 33.1 pg (ref 26.0–34.0)
MCHC: 32.5 g/dL (ref 30.0–36.0)
MCV: 101.8 fL — ABNORMAL HIGH (ref 80.0–100.0)
Platelets: 268 10*3/uL (ref 150–400)
RBC: 3.26 MIL/uL — ABNORMAL LOW (ref 4.22–5.81)
RDW: 14.1 % (ref 11.5–15.5)
WBC: 18.4 10*3/uL — ABNORMAL HIGH (ref 4.0–10.5)
nRBC: 0 % (ref 0.0–0.2)

## 2022-07-29 LAB — URINALYSIS, W/ REFLEX TO CULTURE (INFECTION SUSPECTED)
Bilirubin Urine: NEGATIVE
Glucose, UA: NEGATIVE mg/dL
Ketones, ur: 5 mg/dL — AB
Nitrite: NEGATIVE
Protein, ur: 100 mg/dL — AB
Specific Gravity, Urine: 1.017 (ref 1.005–1.030)
pH: 5 (ref 5.0–8.0)

## 2022-07-29 LAB — GLUCOSE, CAPILLARY
Glucose-Capillary: 97 mg/dL (ref 70–99)
Glucose-Capillary: 95 mg/dL (ref 70–99)
Glucose-Capillary: 96 mg/dL (ref 70–99)
Glucose-Capillary: 102 mg/dL — ABNORMAL HIGH (ref 70–99)

## 2022-07-29 LAB — BASIC METABOLIC PANEL
Anion gap: 11 (ref 5–15)
BUN: 14 mg/dL (ref 8–23)
CO2: 26 mmol/L (ref 22–32)
Calcium: 7.8 mg/dL — ABNORMAL LOW (ref 8.9–10.3)
Chloride: 102 mmol/L (ref 98–111)
Creatinine, Ser: 1.11 mg/dL (ref 0.61–1.24)
GFR, Estimated: >60 mL/min (ref >60)
Glucose, Bld: 96 mg/dL (ref 70–99)
Potassium: 3.1 mmol/L — ABNORMAL LOW (ref 3.5–5.1)
Sodium: 139 mmol/L (ref 135–145)

## 2022-07-29 LAB — URINE CULTURE

## 2022-07-29 LAB — ECHOCARDIOGRAM COMPLETE

## 2022-07-29 MED ORDER — GLYCOPYRROLATE 0.2 MG/ML IJ SOLN: 0.2000 mg | INTRAMUSCULAR | Status: DC | PRN

## 2022-07-29 MED ORDER — FOLIC ACID 1 MG PO TABS
1.0000 mg | ORAL_TABLET | Freq: Every day | ORAL | Status: DC
  Administered 2022-07-29: 1 mg via ORAL
  Filled 2022-07-29: qty 1

## 2022-07-29 MED ORDER — GLYCOPYRROLATE 1 MG PO TABS: 1.0000 mg | ORAL_TABLET | ORAL | Status: DC | PRN

## 2022-07-29 MED ORDER — POLYVINYL ALCOHOL 1.4 % OP SOLN: 1.0000 [drp] | Freq: Four times a day (QID) | OPHTHALMIC | Status: DC | PRN

## 2022-07-29 MED ORDER — PIPERACILLIN-TAZOBACTAM 3.375 G IVPB: 3.3750 g | Freq: Three times a day (TID) | INTRAVENOUS | Status: DC

## 2022-07-29 MED ORDER — HALOPERIDOL 1 MG PO TABS: 0.5000 mg | ORAL_TABLET | ORAL | Status: DC | PRN

## 2022-07-29 MED ORDER — THIAMINE HCL 100 MG/ML IJ SOLN: 100.0000 mg | Freq: Every day | INTRAMUSCULAR | Status: DC

## 2022-07-29 MED ORDER — THIAMINE MONONITRATE 100 MG PO TABS
100.0000 mg | ORAL_TABLET | Freq: Every day | ORAL | Status: DC
  Administered 2022-07-29: 100 mg via ORAL
  Filled 2022-07-29: qty 1

## 2022-07-29 MED ORDER — MORPHINE SULFATE (PF) 2 MG/ML IV SOLN: 2.0000 mg | INTRAVENOUS | Status: DC | PRN

## 2022-07-29 MED ORDER — HALOPERIDOL LACTATE 2 MG/ML PO CONC: 0.5000 mg | ORAL | Status: DC | PRN

## 2022-07-29 MED ORDER — CHLORHEXIDINE GLUCONATE CLOTH 2 % EX PADS
6.0000 | MEDICATED_PAD | Freq: Every day | CUTANEOUS | Status: DC
  Administered 2022-07-29: 6 via TOPICAL

## 2022-07-29 MED ORDER — DIPHENOXYLATE-ATROPINE 2.5-0.025 MG PO TABS
2.0000 | ORAL_TABLET | Freq: Four times a day (QID) | ORAL | Status: DC | PRN
  Administered 2022-07-29: 2 via ORAL
  Filled 2022-07-29: qty 2

## 2022-07-29 MED ORDER — ONDANSETRON 4 MG PO TBDP: 4.0000 mg | ORAL_TABLET | Freq: Four times a day (QID) | ORAL | Status: DC | PRN

## 2022-07-29 MED ORDER — LORAZEPAM 2 MG/ML IJ SOLN
INTRAMUSCULAR | Status: AC
  Administered 2022-07-29: 3 mg via INTRAVENOUS
  Filled 2022-07-29: qty 2

## 2022-07-29 MED ORDER — DIAZEPAM 5 MG/ML IJ SOLN
5.0000 mg | Freq: Four times a day (QID) | INTRAMUSCULAR | Status: DC
  Administered 2022-07-29 – 2022-07-30 (×4): 5 mg via INTRAVENOUS
  Filled 2022-07-29 (×3): qty 2

## 2022-07-29 MED ORDER — PIPERACILLIN-TAZOBACTAM 3.375 G IVPB 30 MIN
3.3750 g | Freq: Once | INTRAVENOUS | Status: AC
  Administered 2022-07-29: 3.375 g via INTRAVENOUS
  Filled 2022-07-29 (×2): qty 50

## 2022-07-29 MED ORDER — POTASSIUM CHLORIDE 10 MEQ/100ML IV SOLN
10.0000 meq | INTRAVENOUS | Status: AC
  Administered 2022-07-29 (×4): 10 meq via INTRAVENOUS
  Filled 2022-07-29 (×4): qty 100

## 2022-07-29 MED ORDER — LORAZEPAM 1 MG PO TABS
1.0000 mg | ORAL_TABLET | ORAL | Status: DC | PRN
  Administered 2022-07-29: 1 mg via ORAL
  Filled 2022-07-29: qty 1

## 2022-07-29 MED ORDER — MORPHINE SULFATE (PF) 2 MG/ML IV SOLN
2.0000 mg | INTRAVENOUS | Status: DC
  Administered 2022-07-29 – 2022-07-30 (×5): 2 mg via INTRAVENOUS
  Filled 2022-07-29 (×5): qty 1

## 2022-07-29 MED ORDER — HALOPERIDOL LACTATE 5 MG/ML IJ SOLN: 0.5000 mg | INTRAMUSCULAR | Status: DC | PRN

## 2022-07-29 MED ORDER — DIAZEPAM 5 MG/ML IJ SOLN
5.0000 mg | INTRAMUSCULAR | Status: DC | PRN
  Filled 2022-07-29: qty 2

## 2022-07-29 MED ORDER — LORAZEPAM 2 MG/ML IJ SOLN: 1.0000 mg | INTRAMUSCULAR | Status: DC | PRN

## 2022-07-29 MED ORDER — ONDANSETRON HCL 4 MG/2ML IJ SOLN: 4.0000 mg | Freq: Four times a day (QID) | INTRAMUSCULAR | Status: DC | PRN

## 2022-07-29 MED ORDER — ADULT MULTIVITAMIN W/MINERALS CH
1.0000 | ORAL_TABLET | Freq: Every day | ORAL | Status: DC
  Administered 2022-07-29: 1 via ORAL
  Filled 2022-07-29: qty 1

## 2022-07-29 MED ORDER — MORPHINE SULFATE (CONCENTRATE) 10 MG/0.5ML PO SOLN: 5.0000 mg | ORAL | Status: DC | PRN

## 2022-07-29 MED ORDER — HALOPERIDOL LACTATE 5 MG/ML IJ SOLN: 2.0000 mg | Freq: Four times a day (QID) | INTRAMUSCULAR | Status: DC | PRN

## 2022-07-29 NOTE — Consult Note (Signed)
Consultation Note Date: 07/29/2022   Patient Name: [name] Oneal  DOB: 1945/08/26  MRN: [id number]  Age / Sex: 77 y.o., male  PCP: Pcp, No Referring Physician: Starleen Arms, MD  Reason for Consultation:  GOC  HPI/Patient Profile: 77 y.o. male  with past medical history of CAD-PCI, DM, HTN, COPD, alcohol abuse recent hospitalization 3/23-4/16 for NSTEMI that was complicated by alcohol withdrawal- dc'd to SNF now  admitted on 07/28/2022 with acute bilateral PE and sepsis. He has been agitated and required restraints. Palliative medicine consulted for GOC.   Primary Decision Maker HCPOA - daughter- Inocencio Homes  Discussion: Chart reviewed including labs, progress notes, imaging from this and previous encounters.  On evaluation patient wakes intermittently with nonconfluent speech.  Daughter Tammy at bedside.  Mr. Boyar has previously been very cognitively sharp. 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 Enjoyed doing [employer] crossword daily. Tammy has noticed great decline in physical and mental status over the last few months.  He is now totally dependent for care, confused and agitated most of the time.  He had expressed to her that not having his independence was indicative of poor quality of life for him and he would not want aggressive medical interventions aimed at prolonging his life in the setting of fatal illness.  Based on patient's desired goals of care and values we discussed comfort measures only status. Discussed transition to comfort measures only which includes stopping IV fluids, antibiotics, labs and providing symptom management for SOB, anxiety, nausea, vomiting, and other symptoms of dying.  Tammy would like to transition him to comfort measures only and consult hospice.      SUMMARY OF RECOMMENDATIONS -Comfort measures only -Valium IV 5mg  IV q6 hours scheduled for agitation -Valium IV  5mg  q4hrs prn for agitation- avoid restraints -Morphine 2mg  IV q4hrs for pain, SOB -Morphine 2mg  IV q15 min prn for pain SOB -robinul 0.2mg  IV q2hr prn terminal secretions -haldol 0.5mg  IV q4 hours agitation -appreciate TOC for hospice referral    Code Status/Advance Care Planning: DNR   Prognosis:   < 2 weeks  Discharge Planning: To Be Determined  Primary Diagnoses: Present on Admission:  Essential hypertension  Alcohol abuse with alcohol-induced mood disorder (HCC)  COPD (chronic obstructive pulmonary disease) (HCC)  Acute pulmonary embolism (HCC)  AKI (acute kidney injury) (HCC)   Review of Systems  Physical Exam  Vital Signs: BP (!) 100/55 (BP Location: Right Arm)   Pulse 89   Temp 99.8 F (37.7 C) (Axillary)   Resp 20   SpO2 95%  Pain Scale: 0-10   Pain Score: Asleep   SpO2: SpO2: 95 % O2 Device:SpO2: 95 % O2 Flow Rate: .O2 Flow Rate (L/min): 3 L/min  IO: Intake/output summary:  Intake/Output Summary (Last 24 hours) at 07/29/2022 1532 Last data filed at 07/29/2022 1411 Gross per 24 hour  Intake 1380.86 ml  Output 1100 ml  Net 280.86 ml    LBM: Last BM Date : 07/29/22 Baseline Weight:   Most recent weight:  Thank you for this consult. Palliative medicine will continue to follow and assist as needed.  Time Total: 90 minutes Greater than 50%  of this time was spent counseling and coordinating care related to the above assessment and plan.  Signed by: Ocie Bob, AGNP-C Palliative Medicine    Please contact Palliative Medicine Team phone at [phone] for questions and concerns.  For individual provider: See Loretha Stapler

## 2022-07-29 NOTE — Progress Notes (Signed)
PROGRESS NOTE    [name] Oneal  ZOX:[id number] DOB: 03/03/1946 DOA: 07/28/2022 PCP: Pcp, No   Chief Complaint  Patient presents with   Shortness of Breath    Brief Narrative:   [name] Oneal is a 77 y.o. male with medical history significant of CAD status post PCI, hypertension, hyperlipidemia, COPD, diabetes, alcohol use, tobacco withdrawal presenting with shortness of breath  -Patient with recent hospitalization secondary to NSTEMI under cardiology service but this was complicated by alcohol withdrawal leading patient to be admitted to the ICU on Precedex and phenobarbital. -Patient admitted secondary to an shortness of breath, where his workup was significant for acute bilateral PE  Assessment & Plan:   Principal Problem:   Acute pulmonary embolism (HCC) Active Problems:   Essential hypertension   COPD (chronic obstructive pulmonary disease) (HCC)   Alcohol abuse with alcohol-induced mood disorder (HCC)   AKI (acute kidney injury) (HCC)   HLD (hyperlipidemia)   Pre-diabetes   Leukocytosis   Cognitive deficits   Abdominal pain  Pulmonary embolism Acute respiratory failure with hypoxia -Most likely in the setting of bedbound status, prolonged hospitalization, and deconditioning -Continue with Avelox, full treatment anticoagulation dose. -Follow on 2D echo to evaluate for right heart strain  Sepsis  -Status present on admission, fever, tachypnea, tachycardia with leukocytosis -Source of sepsis can be multifactorial, most likely due to colitis, with possible UTI. -will start On IV Zosyn. -will obtain blood cultures and urine cultures -Will monitor LFTs closely, if he complains of right upper quadrant abdominal pain we will proceed with right upper quadrant ultrasound  Acute metabolic encephalopathy/ Hospital delirium -Patient high risk for hospital delirium given prolonged recent hospitalization with alcohol withdrawals -Currently confused overnight,  requiring restraints, will order as needed Haldol -Continue to hold gabapentin   AKI > Creatinine elevated to 1.56 baseline 1. - IV fluids overnight -Hold Lasix and lisinopril. - Trend renal function and electrolytes   Cognitive deficit > Patient has waxing and waning confusion ever since his recent admission last month.  Family reports this has been worse when he was on gabapentin. - Hold gabapentin - Continue to monitor   HTN CAD HLD > Recent admission for NSTEMI.  Complicated by alcohol withdrawal and ICU stay as per HPI. > Has history of prior PCI but workup for that NSTEMI was deferred and he was seen outpatient recently and due to his lack of shortness of breath, no chest pain, and overall stability was continued on medical management. - Continue home metoprolol - Continue home aspirin, atorvastatin - Holding home Lasix and lisinopril as above   COPD > Evidence of emphysema on chest CT. - Not currently on any inhalers - As needed albuterol   Prediabetes > Receiving insulin at facility - SSI    Neuropathy - Holding gabapentin, will continue with nortriptyline   Alcohol use > Recently went through withdrawals during previous admission.  Has been at facility since that time.  Has not been consuming alcohol. - Thiamine, folate, multivitamin  Goals of care discussion -I have discussed with daughter at length at bedside, and she has noted significant decline in her father, and she understands guarded prognosis at this point, and she is interested about discussing with palliative medicine about CODE STATUS, and goals of care discussion with possibility of no escalation of care/comfort if no improvement noted with current medical management.     DVT prophylaxis: Lovenox Code Status: Full , daughter is interested to talk to palliative medicine about CODE STATUS,  and goals of care. Family Communication: Discussed with daughter at bedside Disposition:      Consultants:   Palliaitve medicine requested   Subjective:  Daughter at bedside, reports patient had increased confusion, difficulty sleeping at nighttime  Objective: Vitals:   07/29/22 0000 07/29/22 0300 07/29/22 0747 07/29/22 0855  BP:  (!) 141/75 (!) 163/138 109/62  Pulse: 92 (!) 101 (!) 115 92  Resp: (!) 24 19 (!) 27 20  Temp:  97.7 F (36.5 C) (!) 101.1 F (38.4 C) (!) 100.5 F (38.1 C)  TempSrc:  Axillary Axillary Axillary  SpO2: 99% 96% 96% 99%    Intake/Output Summary (Last 24 hours) at 07/29/2022 0925 Last data filed at 07/29/2022 0500 Gross per 24 hour  Intake 1380.86 ml  Output 2675 ml  Net -1294.14 ml   There were no vitals filed for this visit.  Examination:  Lethargic, confused, restless, does not follow commands or answer any questions, currently on bilateral mittens and wrist restraints  Fair air entry bilaterally, diminished at the bases RRR,No Gallops,Rubs or new Murmurs, No Parasternal Heave +ve B.Sounds, Abd Soft, No tenderness, No rebound - guarding or rigidity. No Cyanosis, Clubbing or edema, No new Rash or bruise       Data Reviewed: I have personally reviewed following labs and imaging studies  CBC: Recent Labs  Lab 07/24/22 1955 07/28/22 0558 07/28/22 0624 07/29/22 0544  WBC 14.7* 19.9*  --  18.4*  NEUTROABS 11.5* 16.2*  --   --   HGB 12.2* 10.6* 10.9* 10.8*  HCT 36.6* 31.3* 32.0* 33.2*  MCV 101.4* 98.7  --  101.8*  PLT 241 226  --  268    Basic Metabolic Panel: Recent Labs  Lab 07/24/22 1955 07/28/22 0558 07/28/22 0624 07/29/22 0544  NA 138 135 136 139  K 3.7 3.1* 3.1* 3.1*  CL 98 97*  --  102  CO2 25 23  --  26  GLUCOSE 101* 110*  --  96  BUN 26* 25*  --  14  CREATININE 1.52* 1.56*  --  1.11  CALCIUM 8.9 7.9*  --  7.8*    GFR: Estimated Creatinine Clearance: 70.3 mL/min (by C-G formula based on SCr of 1.11 mg/dL).  Liver Function Tests: Recent Labs  Lab 07/24/22 1955 07/28/22 0558  AST 24 22  ALT 25 21  ALKPHOS 74 84   BILITOT 1.2 0.9  PROT 6.9 5.8*  ALBUMIN 2.7* 2.3*    CBG: Recent Labs  Lab 07/28/22 1552 07/28/22 2034 07/28/22 2355 07/29/22 0357 07/29/22 0745  GLUCAP 104* 100* 90 97 95     Recent Results (from the past 240 hour(s))  Resp panel by RT-PCR (RSV, Flu A&B, Covid) Anterior Nasal Swab     Status: None   Collection Time: 07/28/22  6:06 AM   Specimen: Anterior Nasal Swab  Result Value Ref Range Status   SARS Coronavirus 2 by RT PCR NEGATIVE NEGATIVE Final   Influenza A by PCR NEGATIVE NEGATIVE Final   Influenza B by PCR NEGATIVE NEGATIVE Final    Comment: (NOTE) The Xpert Xpress SARS-CoV-2/FLU/RSV plus assay is intended as an aid in the diagnosis of influenza from Nasopharyngeal swab specimens and should not be used as a sole basis for treatment. Nasal washings and aspirates are unacceptable for Xpert Xpress SARS-CoV-2/FLU/RSV testing.  Fact Sheet for Patients: BloggerCourse.com  Fact Sheet for Healthcare Providers: SeriousBroker.it  This test is not yet approved or cleared by the [place] and has been authorized for  detection and/or diagnosis of SARS-CoV-2 by FDA under an Emergency Use Authorization (EUA). This EUA will remain in effect (meaning this test can be used) for the duration of the COVID-19 declaration under Section 564(b)(1) of the Act, 21 U.S.C. section 360bbb-3(b)(1), unless the authorization is terminated or revoked.     Resp Syncytial Virus by PCR NEGATIVE NEGATIVE Final    Comment: (NOTE) Fact Sheet for Patients: BloggerCourse.com  Fact Sheet for Healthcare Providers: SeriousBroker.it  This test is not yet approved or cleared by the [place] FDA and has been authorized for detection and/or diagnosis of SARS-CoV-2 by FDA under an Emergency Use Authorization (EUA). This EUA will remain in effect (meaning this test can be used) for the  duration of the COVID-19 declaration under Section 564(b)(1) of the Act, 21 U.S.C. section 360bbb-3(b)(1), unless the authorization is terminated or revoked.  Performed at [facility], [address], [place], [place] [address]          Radiology Studies: CT ABDOMEN PELVIS WO CONTRAST  Result Date: 07/28/2022 CLINICAL DATA:  Bowel obstruction suspected EXAM: CT ABDOMEN AND PELVIS WITHOUT CONTRAST TECHNIQUE: Multidetector CT imaging of the abdomen and pelvis was performed following the standard protocol without IV contrast. RADIATION DOSE REDUCTION: This exam was performed according to the departmental dose-optimization program which includes automated exposure control, adjustment of the mA and/or kV according to patient size and/or use of iterative reconstruction technique. COMPARISON:  CT abdomen pelvis 08/15/2007. FINDINGS: Lower chest: Bibasilar atelectasis. Unchanged right basilar calcified granuloma. Hepatobiliary: No focal liver abnormality is seen. Gallbladder is mildly distended with mild wall thickening versus motion artifact. Pancreas: Unremarkable. No pancreatic ductal dilatation or surrounding inflammatory changes. Spleen: Normal in size without focal abnormality. Adrenals/Urinary Tract: Adrenal glands are unremarkable. Simple right lower pole renal cyst which requires no follow-up imaging. No hydronephrosis. Symmetric excretion of contrast related to recent CT scan of the chest. There is symmetric bilateral perinephric stranding. The bladder is decompressed with Foley catheter in place. Stomach/Bowel: The stomach is within normal limits. There is no evidence of bowel obstruction.Normal appendix. Fluid-filled rectosigmoid colon with mild wall thickening. There is also mild wall thickening of the ascending and transverse colon. Scattered pericolonic stranding. Vascular/Lymphatic: Extensive aortoiliac atherosclerosis. No AAA. No lymphadenopathy. Reproductive: Unremarkable. Other: No  free air.  No ascites.  No bowel containing hernia. Musculoskeletal: Avascular necrosis of the femoral heads. There is partial right-sided tracheal surface collapse anteriorly. There is moderate bilateral hip osteoarthritis. Multilevel degenerative changes of the spine worst at L5-S1. IMPRESSION: Fluid-filled rectosigmoid colon with mild wall thickening, and additional mild wall thickening of the ascending and transverse colon with scattered pericolonic stranding. Findings are suggestive of mild colitis. No evidence of bowel obstruction. Mildly distended gallbladder with mild wall thickening. Consider right upper quadrant ultrasound. Avascular necrosis of the femoral heads with articular surface collapse on the right and moderate osteoarthritis of the hips. Electronically Signed   By: Caprice Renshaw M.D.   On: 07/28/2022 18:13   CT Angio Chest PE W and/or Wo Contrast  Result Date: 07/28/2022 CLINICAL DATA:  Shortness of breath. EXAM: CT ANGIOGRAPHY CHEST WITH CONTRAST TECHNIQUE: Multidetector CT imaging of the chest was performed using the standard protocol during bolus administration of intravenous contrast. Multiplanar CT image reconstructions and MIPs were obtained to evaluate the vascular anatomy. RADIATION DOSE REDUCTION: This exam was performed according to the departmental dose-optimization program which includes automated exposure control, adjustment of the mA and/or kV according to patient size and/or use  of iterative reconstruction technique. CONTRAST:  75mL OMNIPAQUE IOHEXOL 350 MG/ML SOLN COMPARISON:  X-ray 07/28/2022. FINDINGS: Cardiovascular: Scattered bilateral pulmonary emboli identified along segmental vessels. Example right lower lobe series 6, image 99, middle lobe series 6, image 87. A few small emboli on the left as well. Overall mild clot burden. No evidence of right heart strain. The heart itself is enlarged. Coronary artery calcifications are seen. Trace pericardial fluid or thickening. The  thoracic aorta has a normal course and caliber with scattered vascular calcification. There is bovine type aortic arch, a normal variant. Mediastinum/Nodes: Normal caliber thoracic esophagus. Preserved thyroid gland. No specific abnormal lymph node enlargement seen in the axillary region or mediastinum. There are some small bilateral hilar nodes identified which are less than a cm in short axis and not pathologic by size criteria. Calcified right hilar lymph nodes are identified. Lungs/Pleura: Few bilateral calcified lung nodules are identified largest in the right lower lobe consistent with old granulomatous disease. There are some areas of calcified pleural plaques bilaterally. Underlying emphysematous lung changes with interstitial septal thickening and some patchy opacities with dependent in the left upper and bilateral lower lobes. Diffuse breathing motion identified. Upper Abdomen: In the upper abdomen the adrenal glands are preserved but incompletely included in the imaging field. Musculoskeletal: Diffuse degenerative changes of the spine with bridging syndesmophytes and osteophytes. Review of the MIP images confirms the above findings. IMPRESSION: Bilateral segmental pulmonary emboli.  Mild clot burden. Centrilobular emphysematous changes with areas of interstitial septal thickening and dependent atelectasis. Evidence of old granulomatous disease. Enlarged heart. Diffuse breathing motion Critical Value/emergent results were called by telephone at the time of interpretation on 07/28/2022 at 7:56 am to provider [facility] , who verbally acknowledged these results. Aortic Atherosclerosis (ICD10-I70.0) and Emphysema (ICD10-J43.9). Electronically Signed   By: Karen Kays M.D.   On: 07/28/2022 11:16   DG Chest Port 1 View  Result Date: 07/28/2022 CLINICAL DATA:  77 year old male with history of shortness of breath. EXAM: PORTABLE CHEST 1 VIEW COMPARISON:  Chest x-ray 07/04/2022. FINDINGS: Opacity in the  left base obscuring the left hemidiaphragm and left costophrenic sulcus. Ill-defined opacity in the medial aspect of the right lung base also noted. No definite right pleural effusion. No pneumothorax. No evidence of pulmonary edema. Heart size is mildly enlarged. Upper mediastinal contours are within normal limits allowing for patient rotation to the left. Atherosclerotic calcifications are noted in the thoracic aorta. IMPRESSION: 1. Atelectasis and/or consolidation in the lung bases bilaterally (left-greater-than-right) with small to moderate left pleural effusion. 2. Mild cardiomegaly. 3. Aortic atherosclerosis. Electronically Signed   By: Trudie Reed M.D.   On: 07/28/2022 07:21        Scheduled Meds:  aspirin  81 mg Oral Daily   atorvastatin  80 mg Oral Daily   Chlorhexidine Gluconate Cloth  6 each Topical Daily   enoxaparin (LOVENOX) injection  110 mg Subcutaneous Q12H   folic acid  1 mg Oral Daily   insulin aspart  0-9 Units Subcutaneous Q4H   metoprolol tartrate  25 mg Oral BID   multivitamin with minerals  1 tablet Oral Daily   nortriptyline  25 mg Oral QHS   sodium chloride flush  3 mL Intravenous Q12H   thiamine  100 mg Oral Daily   Or   thiamine  100 mg Intravenous Daily   Continuous Infusions:  sodium chloride 50 mL/hr at 07/29/22 0523   piperacillin-tazobactam 3.375 g (07/29/22 0921)   Followed by   piperacillin-tazobactam (  ZOSYN)  IV     potassium chloride       LOS: 0 days       Huey Bienenstock, MD [facility]   To contact the attending provider between 7A-7P or the covering provider during after hours 7P-7A, please log into the web site [email or web address] and access using universal [place] password for that web site. If you do not have the password, please call the hospital operator.  07/29/2022, 9:25 AM

## 2022-07-29 NOTE — Plan of Care (Signed)
Problem: Education: Goal: Ability to describe self-care measures that may prevent or decrease complications (Diabetes Survival Skills Education) will improve 07/29/2022 0653 by Arnetha Courser, RN Outcome: Progressing 07/29/2022 0653 by Arnetha Courser, RN Outcome: Progressing Goal: Individualized Educational Video(s) 07/29/2022 0653 by Arnetha Courser, RN Outcome: Progressing 07/29/2022 0653 by Arnetha Courser, RN Outcome: Progressing   Problem: Coping: Goal: Ability to adjust to condition or change in health will improve 07/29/2022 0653 by Arnetha Courser, RN Outcome: Progressing 07/29/2022 0653 by Arnetha Courser, RN Outcome: Progressing   Problem: Fluid Volume: Goal: Ability to maintain a balanced intake and output will improve 07/29/2022 0653 by Arnetha Courser, RN Outcome: Progressing 07/29/2022 0653 by Arnetha Courser, RN Outcome: Progressing   Problem: Health Behavior/Discharge Planning: Goal: Ability to identify and utilize available resources and services will improve 07/29/2022 0653 by Arnetha Courser, RN Outcome: Progressing 07/29/2022 0653 by Arnetha Courser, RN Outcome: Progressing Goal: Ability to manage health-related needs will improve 07/29/2022 0653 by Arnetha Courser, RN Outcome: Progressing 07/29/2022 0653 by Arnetha Courser, RN Outcome: Progressing   Problem: Metabolic: Goal: Ability to maintain appropriate glucose levels will improve 07/29/2022 0653 by Arnetha Courser, RN Outcome: Progressing 07/29/2022 0653 by Arnetha Courser, RN Outcome: Progressing   Problem: Nutritional: Goal: Maintenance of adequate nutrition will improve 07/29/2022 0653 by Arnetha Courser, RN Outcome: Progressing 07/29/2022 0653 by Arnetha Courser, RN Outcome: Progressing Goal: Progress toward achieving an optimal weight will improve 07/29/2022 0653 by Arnetha Courser, RN Outcome: Progressing 07/29/2022 0653 by  Arnetha Courser, RN Outcome: Progressing   Problem: Skin Integrity: Goal: Risk for impaired skin integrity will decrease 07/29/2022 0653 by Arnetha Courser, RN Outcome: Progressing 07/29/2022 0653 by Arnetha Courser, RN Outcome: Progressing   Problem: Tissue Perfusion: Goal: Adequacy of tissue perfusion will improve 07/29/2022 0653 by Arnetha Courser, RN Outcome: Progressing 07/29/2022 0653 by Arnetha Courser, RN Outcome: Progressing   Problem: Education: Goal: Knowledge of General Education information will improve Description: Including pain rating scale, medication(s)/side effects and non-pharmacologic comfort measures 07/29/2022 0653 by Arnetha Courser, RN Outcome: Progressing 07/29/2022 0653 by Arnetha Courser, RN Outcome: Progressing   Problem: Health Behavior/Discharge Planning: Goal: Ability to manage health-related needs will improve 07/29/2022 0653 by Arnetha Courser, RN Outcome: Progressing 07/29/2022 0653 by Arnetha Courser, RN Outcome: Progressing   Problem: Clinical Measurements: Goal: Ability to maintain clinical measurements within normal limits will improve 07/29/2022 0653 by Arnetha Courser, RN Outcome: Progressing 07/29/2022 0653 by Arnetha Courser, RN Outcome: Progressing Goal: Will remain free from infection 07/29/2022 0653 by Arnetha Courser, RN Outcome: Progressing 07/29/2022 0653 by Arnetha Courser, RN Outcome: Progressing Goal: Diagnostic test results will improve 07/29/2022 0653 by Arnetha Courser, RN Outcome: Progressing 07/29/2022 0653 by Arnetha Courser, RN Outcome: Progressing Goal: Respiratory complications will improve 07/29/2022 0653 by Arnetha Courser, RN Outcome: Progressing 07/29/2022 0653 by Arnetha Courser, RN Outcome: Progressing Goal: Cardiovascular complication will be avoided 07/29/2022 0653 by Arnetha Courser, RN Outcome: Progressing 07/29/2022 0653 by Arnetha Courser, RN Outcome: Progressing   Problem: Activity: Goal: Risk for activity intolerance will decrease 07/29/2022 0653 by Arnetha Courser, RN Outcome: Progressing 07/29/2022 0653 by Arnetha Courser, RN Outcome: Progressing   Problem: Nutrition: Goal: Adequate nutrition will be maintained 07/29/2022 0653 by Arnetha Courser, RN Outcome: Progressing 07/29/2022 0653 by Arnetha Courser, RN Outcome: Progressing   Problem: Coping: Goal: Level of anxiety will decrease 07/29/2022 0653 by Arnetha Courser, RN Outcome: Progressing 07/29/2022 0653 by Arnetha Courser, RN Outcome: Progressing   Problem: Elimination: Goal: Will not experience complications related to bowel motility 07/29/2022 0653  by Arnetha Courser, RN Outcome: Progressing 07/29/2022 0653 by Arnetha Courser, RN Outcome: Progressing Goal: Will not experience complications related to urinary retention 07/29/2022 0653 by Arnetha Courser, RN Outcome: Progressing 07/29/2022 0653 by Arnetha Courser, RN Outcome: Progressing   Problem: Pain Managment: Goal: General experience of comfort will improve 07/29/2022 0653 by Arnetha Courser, RN Outcome: Progressing 07/29/2022 0653 by Arnetha Courser, RN Outcome: Progressing   Problem: Safety: Goal: Ability to remain free from injury will improve 07/29/2022 0653 by Arnetha Courser, RN Outcome: Progressing 07/29/2022 0653 by Arnetha Courser, RN Outcome: Progressing   Problem: Skin Integrity: Goal: Risk for impaired skin integrity will decrease 07/29/2022 0653 by Arnetha Courser, RN Outcome: Progressing 07/29/2022 0653 by Arnetha Courser, RN Outcome: Progressing   Problem: Education: Goal: Knowledge of disease or condition will improve 07/29/2022 0653 by Arnetha Courser, RN Outcome: Progressing 07/29/2022 0653 by Arnetha Courser, RN Outcome: Progressing Goal: Knowledge of the prescribed therapeutic regimen will  improve 07/29/2022 0653 by Arnetha Courser, RN Outcome: Progressing 07/29/2022 0653 by Arnetha Courser, RN Outcome: Progressing Goal: Individualized Educational Video(s) 07/29/2022 0653 by Arnetha Courser, RN Outcome: Progressing 07/29/2022 0653 by Arnetha Courser, RN Outcome: Progressing   Problem: Activity: Goal: Ability to tolerate increased activity will improve 07/29/2022 0653 by Arnetha Courser, RN Outcome: Progressing 07/29/2022 0653 by Arnetha Courser, RN Outcome: Progressing Goal: Will verbalize the importance of balancing activity with adequate rest periods 07/29/2022 0653 by Arnetha Courser, RN Outcome: Progressing 07/29/2022 0653 by Arnetha Courser, RN Outcome: Progressing   Problem: Respiratory: Goal: Ability to maintain a clear airway will improve 07/29/2022 0653 by Arnetha Courser, RN Outcome: Progressing 07/29/2022 0653 by Arnetha Courser, RN Outcome: Progressing Goal: Levels of oxygenation will improve 07/29/2022 0653 by Arnetha Courser, RN Outcome: Progressing 07/29/2022 0653 by Arnetha Courser, RN Outcome: Progressing Goal: Ability to maintain adequate ventilation will improve 07/29/2022 0653 by Arnetha Courser, RN Outcome: Progressing 07/29/2022 0653 by Arnetha Courser, RN Outcome: Progressing   Problem: Safety: Goal: Non-violent Restraint(s) 07/29/2022 0653 by Arnetha Courser, RN Outcome: Progressing 07/29/2022 0653 by Arnetha Courser, RN Outcome: Progressing   Problem: Safety: Goal: Non-violent Restraint(s) 07/29/2022 0653 by Arnetha Courser, RN Outcome: Progressing 07/29/2022 0653 by Arnetha Courser, RN Outcome: Progressing   Problem: Education: Goal: Knowledge of disease or condition will improve 07/29/2022 0653 by Arnetha Courser, RN Outcome: Progressing 07/29/2022 0653 by Arnetha Courser, RN Outcome: Progressing Goal: Knowledge of the prescribed therapeutic regimen will  improve 07/29/2022 0653 by Arnetha Courser, RN Outcome: Progressing 07/29/2022 0653 by Arnetha Courser, RN Outcome: Progressing Goal: Individualized Educational Video(s) 07/29/2022 0653 by Arnetha Courser, RN Outcome: Progressing 07/29/2022 0653 by Arnetha Courser, RN Outcome: Progressing   Problem: Activity: Goal: Ability to tolerate increased activity will improve 07/29/2022 0653 by Arnetha Courser, RN Outcome: Progressing 07/29/2022 0653 by Arnetha Courser, RN Outcome: Progressing Goal: Will verbalize the importance of balancing activity with adequate rest periods 07/29/2022 0653 by Arnetha Courser, RN Outcome: Progressing 07/29/2022 0653 by Arnetha Courser, RN Outcome: Progressing   Problem: Respiratory: Goal: Ability to maintain a clear airway will improve 07/29/2022 0653 by Arnetha Courser, RN Outcome: Progressing 07/29/2022 0653 by Arnetha Courser, RN Outcome: Progressing Goal: Levels of oxygenation will improve 07/29/2022 0653 by Arnetha Courser, RN Outcome: Progressing 07/29/2022 0653 by Arnetha Courser, RN Outcome: Progressing Goal: Ability to maintain adequate ventilation will improve 07/29/2022 0653 by Arnetha Courser, RN Outcome: Progressing 07/29/2022 0653 by Arnetha Courser, RN Outcome: Progressing

## 2022-07-29 NOTE — TOC Initial Note (Signed)
Transition of Care [facility]) - Initial/Assessment Note    Patient Details  Name: [name] Oneal MRN: [id number] Date of Birth: 19-Jul-1945  Transition of Care [facility]) CM/SW Contact:    Mearl Latin, LCSW Phone Number: 07/29/2022, 4:15 PM  Clinical Narrative:                 CSW received consult for hospice facility placement. CSW spoke with patient's daughter, [name]. She requested [facility] if possible as her mother was there. CSW sent referral to [facility] for review.   Expected Discharge Plan: Hospice Medical Facility Barriers to Discharge: Hospice Bed not available   Patient Goals and CMS Choice Patient states their goals for this hospitalization and ongoing recovery are:: Comfort   Choice offered to / list presented to : Adult Children [place] ownership interest in [facility].provided to:: Adult Children    Expected Discharge Plan and Services In-house Referral: Clinical Social Work, Hospice / Palliative Care   Post Acute Care Choice: Hospice Living arrangements for the past 2 months: Single Family Home, Skilled Nursing Facility                                      Prior Living Arrangements/Services Living arrangements for the past 2 months: Single Family Home, Skilled Nursing Facility Lives with:: Adult Children Patient language and need for interpreter reviewed:: Yes Do you feel safe going back to the place where you live?: Yes      Need for Family Participation in Patient Care: Yes (Comment) Care giver support system in place?: Yes (comment)   Criminal Activity/Legal Involvement Pertinent to Current Situation/Hospitalization: No - Comment as needed  Activities of Daily Living Home Assistive Devices/Equipment: Cane (specify quad or straight) ADL Screening (condition at time of admission) Patient's cognitive ability adequate to safely complete daily activities?: No Is the patient deaf or have difficulty hearing?: No Does the  patient have difficulty seeing, even when wearing glasses/contacts?: No Does the patient have difficulty concentrating, remembering, or making decisions?: Yes Patient able to express need for assistance with ADLs?: Yes Does the patient have difficulty dressing or bathing?: Yes Independently performs ADLs?: No Communication: Independent Dressing (OT): Needs assistance Is this a change from baseline?: Change from baseline, expected to last >3 days Grooming: Needs assistance Is this a change from baseline?: Change from baseline, expected to last >3 days Feeding: Independent Bathing: Needs assistance Is this a change from baseline?: Change from baseline, expected to last >3 days Toileting: Needs assistance Is this a change from baseline?: Change from baseline, expected to last >3days In/Out Bed: Needs assistance Is this a change from baseline?: Change from baseline, expected to last >3 days Walks in Home: Needs assistance Is this a change from baseline?: Change from baseline, expected to last >3 days Does the patient have difficulty walking or climbing stairs?: Yes Weakness of Legs: Both Weakness of Arms/Hands: None  Permission Sought/Granted Permission sought to share information with : Facility Medical sales representative, Family Supports Permission granted to share information with : No  Share Information with NAME: Tammi  Permission granted to share info w AGENCY: Hospice  Permission granted to share info w Relationship: Daughter  Permission granted to share info w Contact Information: [phone]  Emotional Assessment Appearance:: Appears stated age Attitude/Demeanor/Rapport: Unable to Assess Affect (typically observed): Unable to Assess Orientation: : Oriented to Self Alcohol / Substance Use: Not Applicable Psych  Involvement: No (comment)  Admission diagnosis:  Urinary retention [R33.9] Acute pulmonary embolism (HCC) [I26.99] Pulmonary embolism, unspecified chronicity,  unspecified pulmonary embolism type, unspecified whether acute cor pulmonale present (HCC) [I26.99] Patient Active Problem List   Diagnosis Date Noted   Acute pulmonary embolism (HCC) 07/28/2022   HLD (hyperlipidemia) 07/28/2022   Mixed emotional features as adjustment reaction 07/28/2022   Obesity 07/28/2022   Pre-diabetes 07/28/2022   Leukocytosis 07/28/2022   Cognitive deficits 07/28/2022   Abdominal pain 07/28/2022   AKI (acute kidney injury) (HCC) 07/01/2022   Moderate protein-energy malnutrition (HCC) 06/30/2022   Acute metabolic encephalopathy 06/22/2022   Alcohol abuse with alcohol-induced mood disorder (HCC) 09/17/2017   Essential hypertension 07/19/2008   COPD (chronic obstructive pulmonary disease) (HCC) 07/19/2008   PCP:  Pcp, No Pharmacy:   [id number] - [place], [place] - [address] AT [address] [facility] [facility] [address] [place] [address] Phone: [phone] Fax: [phone]     Social Determinants of Health (SDOH) Social History: SDOH Screenings   Food Insecurity: No Food Insecurity (07/28/2022)  Housing: Low Risk  (07/28/2022)  Transportation Needs: No Transportation Needs (07/28/2022)  Utilities: Not At Risk (07/28/2022)  Tobacco Use: High Risk (07/28/2022)   SDOH Interventions:     Readmission Risk Interventions     No data to display

## 2022-07-29 NOTE — Progress Notes (Addendum)
Pharmacy Antibiotic Note  [name] Oneal is a 77 y.o. male admitted on 07/28/2022 with  colitis .  Pharmacy has been consulted for zosyn dosing.  Pt was admitted for bilateral PEs. He is also being r/o for colitis. Wbc 18k, Tm 101.1. Empiric zosyn for now. Ok to [employer] doxy per Dr. Randol Kern.  Scr down to 1.11  Plan: Zosyn 3.375g IV x1 then q8 F/u LOT     Temp (24hrs), Avg:98.8 F (37.1 C), Min:97.7 F (36.5 C), Max:101.1 F (38.4 C)  Recent Labs  Lab 07/24/22 1955 07/28/22 0558 07/29/22 0544  WBC 14.7* 19.9* 18.4*  CREATININE 1.52* 1.56* 1.11    Estimated Creatinine Clearance: 70.3 mL/min (by C-G formula based on SCr of 1.11 mg/dL).    No Known Allergies  Antimicrobials this admission: 4/30 doxy>>5/1 5/1 zosyn>>  Dose adjustments this admission:   Microbiology results: Flu/covid - neg  Ulyses Southward, PharmD, BCIDP, AAHIVP, CPP Infectious Disease Pharmacist 07/29/2022 8:46 AM

## 2022-07-29 NOTE — Progress Notes (Signed)
  Transition of Care [facility]) Screening Note   Patient Details  Name: [name] Oneal Date of Birth: 1946-02-08   Transition of Care [facility]) CM/SW Contact:    Mearl Latin, LCSW Phone Number: 07/29/2022, 8:42 AM    Transition of Care Department [facility]) has reviewed patient who has been at [facility] for rehab and will require insurance approval for return. CSW acknowledges ETOH consult but patient is currently disoriented. We will continue to monitor patient advancement through interdisciplinary progression rounds. If new patient transition needs arise, please place a TOC consult.

## 2022-07-29 NOTE — Consult Note (Signed)
WOC Nurse Consult Note: Reason for Consult:patient known to our team from last admission, last seen on 07/14/22. Wound type:Pressure Pressure Injury POA: Yes Measurement:To be obtained by Bedside RN and documented on Nursing Flow Sheet. Dressing procedure/placement/frequency:Turning and repositioning is in place.  Wound care to bilateral heels:  Cleanse with soap and water, rinse and dry. Paint with povidone-iodine swabsticks and allow to air dry. When dry, top with calcium alginate [place] [phone]), cover with silicone foam dressings for heels. Perform wound care daily, may reused silicone foam if able for up to 3 days. Place feet into Prevalon boots and boots are to be worn at all times.Vaseline is to be applied daily to the bridge of the nose site of pressure injury. A single layer of xeroform gauze is to be placed to the wounds on the left lateral LE and the right posterior thigh after cleansing once daily and topped with silicone foam.  WOC nursing team will not follow, but will remain available to this patient, the nursing and medical teams.  Please re-consult if needed.  Thank you for inviting [place] to participate in this patient's Plan of Care.  Ladona Mow, MSN, RN, CNS, GNP, Leda Min, [employer], [employer] phone:  [phone]

## 2022-07-30 ENCOUNTER — Inpatient Hospital Stay (HOSPITAL_COMMUNITY): Payer: Medicare PPO

## 2022-07-30 DIAGNOSIS — N179 Acute kidney failure, unspecified: Secondary | ICD-10-CM | POA: Diagnosis not present

## 2022-07-30 DIAGNOSIS — I2699 Other pulmonary embolism without acute cor pulmonale: Secondary | ICD-10-CM | POA: Diagnosis not present

## 2022-07-30 DIAGNOSIS — Z515 Encounter for palliative care: Secondary | ICD-10-CM

## 2022-07-30 LAB — URINE CULTURE

## 2022-07-30 LAB — CULTURE, BLOOD (ROUTINE X 2): Culture: NO GROWTH

## 2022-07-30 MED ORDER — GLYCOPYRROLATE 1 MG PO TABS: 1.0000 mg | ORAL_TABLET | ORAL | Status: DC | PRN

## 2022-07-30 MED ORDER — DIAZEPAM 5 MG/ML IJ SOLN: 5.0000 mg | INTRAMUSCULAR | Status: DC | PRN

## 2022-07-30 MED ORDER — ONDANSETRON 4 MG PO TBDP: 4.0000 mg | ORAL_TABLET | Freq: Four times a day (QID) | ORAL | 0 refills | Status: DC | PRN

## 2022-07-30 MED ORDER — HALOPERIDOL LACTATE 2 MG/ML PO CONC: 0.5000 mg | ORAL | 0 refills | Status: DC | PRN

## 2022-07-30 MED ORDER — MORPHINE SULFATE (CONCENTRATE) 10 MG/0.5ML PO SOLN: 5.0000 mg | ORAL | 0 refills | Status: DC | PRN

## 2022-07-30 MED ORDER — ACETAMINOPHEN 650 MG RE SUPP: 650.0000 mg | Freq: Four times a day (QID) | RECTAL | 0 refills | Status: DC | PRN

## 2022-07-30 MED ORDER — ACETAMINOPHEN 325 MG PO TABS: 650.0000 mg | ORAL_TABLET | Freq: Four times a day (QID) | ORAL | Status: DC | PRN

## 2022-07-30 MED ORDER — HALOPERIDOL 0.5 MG PO TABS: 0.5000 mg | ORAL_TABLET | ORAL | Status: DC | PRN

## 2022-07-30 NOTE — Progress Notes (Signed)
Report called to Elijah Birk RN for patient transferring to [facility] today. Family aware and will return to hospital to wait for patient transfer.

## 2022-07-30 NOTE — Progress Notes (Signed)
[facility]) Hospital Liaison Note  Received request from [facility] for family interest in [facility].  Met with patient and daughter Tammie at bedside to confirm interest and explain services.  Patient eligibility has been confirmed and bed available today for [facility] transfer.  Family is agreeable to transfer today and TOC updated.  Please send signed and completed DNR with patient upon discharge.  RN, please leave all IV's in place for transport to facility.  Family requests patient be medicated prior to transport for comfort.  You may call report any time prior to patient leaving the unit to [phone].  Thank you for the opportunity to participate in this patient's care.  Please don't hesitate to call with any hospice related questions or concerns.  Doreatha Martin, RN, BSN [facility] Liaison [phone]

## 2022-07-30 NOTE — Progress Notes (Signed)
   Palliative Medicine Inpatient Follow Up Note HPI: 77 y.o. male  with past medical history of CAD-PCI, DM, HTN, COPD, alcohol abuse recent hospitalization 3/23-4/16 for NSTEMI that was complicated by alcohol withdrawal- dc'd to SNF now  admitted on 07/28/2022 with acute bilateral PE and sepsis. He has been agitated and required restraints. Palliative medicine consulted for GOC.   Today's Discussion 07/30/2022  *Please note that this is a verbal dictation therefore any spelling or grammatical errors are due to the "Dragon Medical One" system interpretation.  Chart reviewed inclusive of vital signs, progress notes, laboratory results, and diagnostic images.   I met this morning with Tecumseh and his daughter, [name]. Wolfe was resting in bed and appeared to be in NAD. Tammi shares that he has for the most part appeared to be comfortable. Created space and opportunity for Tammi to explore thoughts feelings and fears regarding Burr'  current medical situation.She states that she feels very much at peace with the decision to pursue comfort care.   She expresses that he will be transition this afternoon to [facility].   Questions and concerns addressed/Palliative Support Provided.   Objective Assessment: Vital Signs Vitals:   07/30/22 0500 07/30/22 0800  BP: (!) 142/62   Pulse: (!) 101   Resp: 17 18  Temp: 97.9 F (36.6 C)   SpO2:      Intake/Output Summary (Last 24 hours) at 07/30/2022 1447 Last data filed at 07/30/2022 1037 Gross per 24 hour  Intake 220 ml  Output 800 ml  Net -580 ml    Gen:  Elderly Caucasian M in NAD HEENT: Dry mucous membranes CV: Irregular rate and rhythm  PULM: On 3LPM [place], breathing is even and nonlabored ABD: soft/nontender  EXT: BLE edema  Neuro: Alert to self  only  SUMMARY OF RECOMMENDATIONS   DNAR/DNI  Comfort Care  Doing well on morphine Q4H ATC  Medications per [facility]  Plan for transition to [facility]  Ongoing Palliative care  support  Billing based on MDM: High ______________________________________________________________________________________ Lamarr Lulas [place] Palliative Medicine Team Team Cell Phone: [phone] Please utilize secure chat with additional questions, if there is no response within 30 minutes please call the above phone number  Palliative Medicine Team providers are available by phone from 7am to 7pm daily and can be reached through the team cell phone.  Should this patient require assistance outside of these hours, please call the patient's attending physician.

## 2022-07-30 NOTE — Progress Notes (Signed)
Patient going with [facility] to Hospice  Care.

## 2022-07-30 NOTE — TOC Transition Note (Signed)
Transition of Care [facility]) - CM/SW Discharge Note   Patient Details  Name: [name] Oneal MRN: [id number] Date of Birth: 08/18/1945  Transition of Care [facility]) CM/SW Contact:  Mearl Latin, LCSW Phone Number: 07/30/2022, 2:18 PM   Clinical Narrative:    Patient will DC to: [facility] Anticipated DC date: 07/30/22 Family notified: [name], daughter Transport by: Sharin Mons   Per MD patient ready for DC to [facility]. RN to call report prior to discharge [phone]). RN, patient, patient's family, and facility notified of DC. Discharge Summary sent to facility. DC packet on chart including signed DNR. Ambulance transport requested for patient.   CSW will sign off for now as social work intervention is no longer needed. Please consult [place] again if new needs arise.     Final next level of care: Hospice Medical Facility Barriers to Discharge: Barriers Resolved   Patient Goals and CMS Choice   Choice offered to / list presented to : Adult Children  Discharge Placement                Patient chooses bed at: Other - please specify in the comment section below: [facility]) Patient to be transferred to facility by: PTAR Name of family member notified: Tammi Patient and family notified of of transfer: 07/30/22  Discharge Plan and Services Additional resources added to the After Visit Summary for   In-house Referral: Clinical Social Work, Hospice / Palliative Care   Post Acute Care Choice: Hospice                               Social Determinants of Health (SDOH) Interventions SDOH Screenings   Food Insecurity: No Food Insecurity (07/28/2022)  Housing: Low Risk  (07/28/2022)  Transportation Needs: No Transportation Needs (07/28/2022)  Utilities: Not At Risk (07/28/2022)  Tobacco Use: High Risk (07/28/2022)     Readmission Risk Interventions     No data to display

## 2022-07-30 NOTE — Discharge Summary (Signed)
Physician Discharge Summary  [name] Oneal ZOX:[id number] DOB: 17-Oct-1945 DOA: 07/28/2022  PCP: Oneita Hurt, No  Admit date: 07/28/2022 Discharge date: 07/30/2022  Admitted From: (SNF) Disposition:  ( Residential Hospice)  Recommendations for Outpatient Follow-up:  Management per hospice facility   Discharge Condition: (hospice) CODE STATUS: (Comfort Care)   Brief/Interim Summary:  [name] Oneal is a 77 y.o. male with medical history significant of CAD status post PCI, hypertension, hyperlipidemia, COPD, diabetes, alcohol use, tobacco withdrawal presenting with shortness of breath  -Patient with recent hospitalization secondary to NSTEMI under cardiology service but this was complicated by alcohol withdrawal leading patient to be admitted to the ICU on Precedex and phenobarbital. -Patient presented with shortness of breath, admitted secondary to an shortness of breath, where his workup was significant for acute bilateral PE, as well his workup was significant for altered mental status, sepsis, severe deconditioning, palliative medicine has been consulted, given patient's rapid decline over the last few weeks, both functional, and mental, where he had significant and rapid deterioration, what his wishes has been known in the past not to pursue any aggressive measures, after palliative medicine meeting with the family, decision has been made to proceed with full comfort measures, [facility].  Pulmonary embolism Acute respiratory failure with hypoxia  Severe Sepsis  Colitis Cholelithiasis Acute metabolic encephalopathy/ Hospital delirium AKI Cognitive deficit HTN CAD HLD COPD Prediabetes Neuropathy Alcohol use  Hypokalemia   Discharge Diagnoses:  Principal Problem:   Acute pulmonary embolism (HCC) Active Problems:   Essential hypertension   COPD (chronic obstructive pulmonary disease) (HCC)   Alcohol abuse with alcohol-induced  mood disorder (HCC)   AKI (acute kidney injury) (HCC)   HLD (hyperlipidemia)   Pre-diabetes   Leukocytosis   Cognitive deficits   Abdominal pain   Goals of care, counseling/discussion    Discharge Instructions  Discharge Instructions     Diet - low sodium heart healthy   Complete by: As directed    Discharge instructions   Complete by: As directed    Management per [facility]   Increase activity slowly   Complete by: As directed    No wound care   Complete by: As directed       Allergies as of 07/30/2022   No Known Allergies      Medication List     STOP taking these medications    aspirin 81 MG chewable tablet   atorvastatin 80 MG tablet Commonly known as: LIPITOR   Cephalexin 500 MG tablet   cyanocobalamin 500 MCG tablet Commonly known as: VITAMIN B12   doxycycline 100 mg in sodium chloride 0.9 % 250 mL   folic acid 1 MG tablet Commonly known as: FOLVITE   furosemide 40 MG tablet Commonly known as: LASIX   gabapentin 300 MG capsule Commonly known as: NEURONTIN   HYDROcodone-acetaminophen 5-325 MG tablet Commonly known as: NORCO/VICODIN   insulin glargine-yfgn 100 UNIT/ML injection Commonly known as: SEMGLEE   lisinopril 20 MG tablet Commonly known as: ZESTRIL   metoprolol tartrate 25 MG tablet Commonly known as: LOPRESSOR   multivitamin with minerals Tabs tablet   nicotine 21 mg/24hr patch Commonly known as: NICODERM CQ - dosed in mg/24 hours   nortriptyline 25 MG capsule Commonly known as: PAMELOR   polyethylene glycol 17 g packet Commonly known as: MiraLax   thiamine 100 MG tablet Commonly known as: Vitamin B-1       TAKE these medications    acetaminophen  325 MG tablet Commonly known as: TYLENOL Take 2 tablets (650 mg total) by mouth every 6 (six) hours as needed for mild pain (or Fever >/= 101). What changed:  medication strength how much to take when to take this reasons to take this   acetaminophen  650 MG suppository Commonly known as: TYLENOL Place 1 suppository (650 mg total) rectally every 6 (six) hours as needed for mild pain (or Fever >/= 101). What changed: You were already taking a medication with the same name, and this prescription was added. Make sure you understand how and when to take each.   diazepam 5 MG/ML injection Commonly known as: VALIUM Inject 1 mL (5 mg total) into the vein every 4 (four) hours as needed (agitation).   glycopyrrolate 1 MG tablet Commonly known as: ROBINUL Take 1 tablet (1 mg total) by mouth every 4 (four) hours as needed (excessive secretions).   haloperidol 0.5 MG tablet Commonly known as: HALDOL Take 1 tablet (0.5 mg total) by mouth every 4 (four) hours as needed for agitation (or delirium).   haloperidol 2 MG/ML solution Commonly known as: HALDOL Place 0.3 mLs (0.6 mg total) under the tongue every 4 (four) hours as needed for agitation (or delirium).   morphine CONCENTRATE 10 MG/0.5ML Soln concentrated solution Place 0.25 mLs (5 mg total) under the tongue every 2 (two) hours as needed for moderate pain (or dyspnea).   ondansetron 4 MG disintegrating tablet Commonly known as: ZOFRAN-ODT Take 1 tablet (4 mg total) by mouth every 6 (six) hours as needed for nausea.        No Known Allergies  Consultations: Palliative medicine   Procedures/Studies: CT ABDOMEN PELVIS WO CONTRAST  Result Date: 07/28/2022 CLINICAL DATA:  Bowel obstruction suspected EXAM: CT ABDOMEN AND PELVIS WITHOUT CONTRAST TECHNIQUE: Multidetector CT imaging of the abdomen and pelvis was performed following the standard protocol without IV contrast. RADIATION DOSE REDUCTION: This exam was performed according to the departmental dose-optimization program which includes automated exposure control, adjustment of the mA and/or kV according to patient size and/or use of iterative reconstruction technique. COMPARISON:  CT abdomen pelvis 08/15/2007. FINDINGS: Lower chest:  Bibasilar atelectasis. Unchanged right basilar calcified granuloma. Hepatobiliary: No focal liver abnormality is seen. Gallbladder is mildly distended with mild wall thickening versus motion artifact. Pancreas: Unremarkable. No pancreatic ductal dilatation or surrounding inflammatory changes. Spleen: Normal in size without focal abnormality. Adrenals/Urinary Tract: Adrenal glands are unremarkable. Simple right lower pole renal cyst which requires no follow-up imaging. No hydronephrosis. Symmetric excretion of contrast related to recent CT scan of the chest. There is symmetric bilateral perinephric stranding. The bladder is decompressed with Foley catheter in place. Stomach/Bowel: The stomach is within normal limits. There is no evidence of bowel obstruction.Normal appendix. Fluid-filled rectosigmoid colon with mild wall thickening. There is also mild wall thickening of the ascending and transverse colon. Scattered pericolonic stranding. Vascular/Lymphatic: Extensive aortoiliac atherosclerosis. No AAA. No lymphadenopathy. Reproductive: Unremarkable. Other: No free air.  No ascites.  No bowel containing hernia. Musculoskeletal: Avascular necrosis of the femoral heads. There is partial right-sided tracheal surface collapse anteriorly. There is moderate bilateral hip osteoarthritis. Multilevel degenerative changes of the spine worst at L5-S1. IMPRESSION: Fluid-filled rectosigmoid colon with mild wall thickening, and additional mild wall thickening of the ascending and transverse colon with scattered pericolonic stranding. Findings are suggestive of mild colitis. No evidence of bowel obstruction. Mildly distended gallbladder with mild wall thickening. Consider right upper quadrant ultrasound. Avascular necrosis of the femoral heads with articular  surface collapse on the right and moderate osteoarthritis of the hips. Electronically Signed   By: Caprice Renshaw M.D.   On: 07/28/2022 18:13   CT Angio Chest PE W and/or Wo  Contrast  Result Date: 07/28/2022 CLINICAL DATA:  Shortness of breath. EXAM: CT ANGIOGRAPHY CHEST WITH CONTRAST TECHNIQUE: Multidetector CT imaging of the chest was performed using the standard protocol during bolus administration of intravenous contrast. Multiplanar CT image reconstructions and MIPs were obtained to evaluate the vascular anatomy. RADIATION DOSE REDUCTION: This exam was performed according to the departmental dose-optimization program which includes automated exposure control, adjustment of the mA and/or kV according to patient size and/or use of iterative reconstruction technique. CONTRAST:  75mL OMNIPAQUE IOHEXOL 350 MG/ML SOLN COMPARISON:  X-ray 07/28/2022. FINDINGS: Cardiovascular: Scattered bilateral pulmonary emboli identified along segmental vessels. Example right lower lobe series 6, image 99, middle lobe series 6, image 87. A few small emboli on the left as well. Overall mild clot burden. No evidence of right heart strain. The heart itself is enlarged. Coronary artery calcifications are seen. Trace pericardial fluid or thickening. The thoracic aorta has a normal course and caliber with scattered vascular calcification. There is bovine type aortic arch, a normal variant. Mediastinum/Nodes: Normal caliber thoracic esophagus. Preserved thyroid gland. No specific abnormal lymph node enlargement seen in the axillary region or mediastinum. There are some small bilateral hilar nodes identified which are less than a cm in short axis and not pathologic by size criteria. Calcified right hilar lymph nodes are identified. Lungs/Pleura: Few bilateral calcified lung nodules are identified largest in the right lower lobe consistent with old granulomatous disease. There are some areas of calcified pleural plaques bilaterally. Underlying emphysematous lung changes with interstitial septal thickening and some patchy opacities with dependent in the left upper and bilateral lower lobes. Diffuse breathing  motion identified. Upper Abdomen: In the upper abdomen the adrenal glands are preserved but incompletely included in the imaging field. Musculoskeletal: Diffuse degenerative changes of the spine with bridging syndesmophytes and osteophytes. Review of the MIP images confirms the above findings. IMPRESSION: Bilateral segmental pulmonary emboli.  Mild clot burden. Centrilobular emphysematous changes with areas of interstitial septal thickening and dependent atelectasis. Evidence of old granulomatous disease. Enlarged heart. Diffuse breathing motion Critical Value/emergent results were called by telephone at the time of interpretation on 07/28/2022 at 7:56 am to provider [facility] , who verbally acknowledged these results. Aortic Atherosclerosis (ICD10-I70.0) and Emphysema (ICD10-J43.9). Electronically Signed   By: Karen Kays M.D.   On: 07/28/2022 11:16   DG Chest Port 1 View  Result Date: 07/28/2022 CLINICAL DATA:  77 year old male with history of shortness of breath. EXAM: PORTABLE CHEST 1 VIEW COMPARISON:  Chest x-ray 07/04/2022. FINDINGS: Opacity in the left base obscuring the left hemidiaphragm and left costophrenic sulcus. Ill-defined opacity in the medial aspect of the right lung base also noted. No definite right pleural effusion. No pneumothorax. No evidence of pulmonary edema. Heart size is mildly enlarged. Upper mediastinal contours are within normal limits allowing for patient rotation to the left. Atherosclerotic calcifications are noted in the thoracic aorta. IMPRESSION: 1. Atelectasis and/or consolidation in the lung bases bilaterally (left-greater-than-right) with small to moderate left pleural effusion. 2. Mild cardiomegaly. 3. Aortic atherosclerosis. Electronically Signed   By: Trudie Reed M.D.   On: 07/28/2022 07:21   DG Foot Complete Left  Result Date: 07/24/2022 CLINICAL DATA:  Unstageable pressure wound to the heel EXAM: LEFT FOOT - COMPLETE 3+ VIEW COMPARISON:  None Available.  FINDINGS: No cortical erosion or destruction. There is no evidence of fracture or dislocation. Old healed fifth digit proximal phalanx fracture. Likely similar finding of the third digit proximal phalanx. First digit metatarsophalangeal joint mild to moderate degenerative changes. Posterior and plantar calcaneal spur. Question exostosis arising from the third digit metatarsal neck/distal body. Soft tissues are unremarkable. IMPRESSION: 1.  No acute displaced fracture or dislocation. 2. No radiographic findings to suggest osteomyelitis. Limited evaluation due to overlapping osseous structures and overlying soft tissues. If high clinical concern, please consider MRI for further evaluation (with intravenous contrast if GFR greater than 30). 3. Question exostosis arising from the third digit metatarsal neck/distal body. Electronically Signed   By: Tish Frederickson M.D.   On: 07/24/2022 21:16   CT Cervical Spine Wo Contrast  Result Date: 07/24/2022 CLINICAL DATA:  Recent fall with headaches and neck pain, initial encounter EXAM: CT HEAD WITHOUT CONTRAST CT CERVICAL SPINE WITHOUT CONTRAST TECHNIQUE: Multidetector CT imaging of the head and cervical spine was performed following the standard protocol without intravenous contrast. Multiplanar CT image reconstructions of the cervical spine were also generated. RADIATION DOSE REDUCTION: This exam was performed according to the departmental dose-optimization program which includes automated exposure control, adjustment of the mA and/or kV according to patient size and/or use of iterative reconstruction technique. COMPARISON:  06/30/2022 FINDINGS: CT HEAD FINDINGS Brain: Chronic atrophic changes and white matter ischemic changes are noted. 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 No acute hemorrhage or acute infarction is seen. No space-occupying mass lesion is noted. Vascular: No hyperdense vessel or unexpected calcification. Skull: Normal. Negative for  fracture or focal lesion. Sinuses/Orbits: Mucosal thickening is noted within the sphenoid sinus on the left Other: None. CT CERVICAL SPINE FINDINGS Alignment: Within normal limits. Skull base and vertebrae: 7 cervical segments are well visualized. Anterior flowing osteophytes are noted from C5-C7. Facet hypertrophic changes are noted. No acute fracture or acute facet abnormality is noted. The odontoid is within normal limits. Soft tissues and spinal canal: Surrounding soft tissue structures are within normal limits. No focal hematoma is noted. Diffuse vascular calcifications are seen. Upper chest: Visualized lung apices are unremarkable. Other: None IMPRESSION: CT of the head: Chronic atrophic and ischemic changes without acute abnormality. CT of the cervical spine: Multilevel degenerative change without acute abnormality. Electronically Signed   By: Alcide Clever M.D.   On: 07/24/2022 21:14   CT Head Wo Contrast  Result Date: 07/24/2022 CLINICAL DATA:  Recent fall with headaches and neck pain, initial encounter EXAM: CT HEAD WITHOUT CONTRAST CT CERVICAL SPINE WITHOUT CONTRAST TECHNIQUE: Multidetector CT imaging of the head and cervical spine was performed following the standard protocol without intravenous contrast. Multiplanar CT image reconstructions of the cervical spine were also generated. RADIATION DOSE REDUCTION: This exam was performed according to the departmental dose-optimization program which includes automated exposure control, adjustment of the mA and/or kV according to patient size and/or use of iterative reconstruction technique. COMPARISON:  06/30/2022 FINDINGS: CT HEAD FINDINGS Brain: Chronic atrophic changes and white matter ischemic changes are noted. 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 No acute hemorrhage or acute infarction is seen. No space-occupying mass lesion is noted. Vascular: No hyperdense vessel or unexpected calcification. Skull: Normal. Negative for fracture or  focal lesion. Sinuses/Orbits: Mucosal thickening is noted within the sphenoid sinus on the left Other: None. CT CERVICAL SPINE FINDINGS Alignment: Within normal limits. Skull base and vertebrae: 7 cervical segments are well visualized. Anterior flowing osteophytes are  noted from C5-C7. Facet hypertrophic changes are noted. No acute fracture or acute facet abnormality is noted. The odontoid is within normal limits. Soft tissues and spinal canal: Surrounding soft tissue structures are within normal limits. No focal hematoma is noted. Diffuse vascular calcifications are seen. Upper chest: Visualized lung apices are unremarkable. Other: None IMPRESSION: CT of the head: Chronic atrophic and ischemic changes without acute abnormality. CT of the cervical spine: Multilevel degenerative change without acute abnormality. Electronically Signed   By: Alcide Clever M.D.   On: 07/24/2022 21:14   DG Foot Complete Right  Result Date: 07/24/2022 CLINICAL DATA:  Unstageable pressure wound to the heel EXAM: RIGHT FOOT COMPLETE - 3+ VIEW COMPARISON:  None Available. FINDINGS: No cortical erosion or destruction. There is no evidence of fracture or dislocation. First digit metatarsophalangeal joint moderate degenerative changes. Plantar and posterior calcaneal spur. Soft tissues are unremarkable. IMPRESSION: 1. No acute displaced fracture or dislocation. 2. No radiographic findings to suggest osteomyelitis. Limited evaluation due to overlapping osseous structures and overlying soft tissues. If high clinical concern, please consider MRI for further evaluation (with intravenous contrast if GFR greater than 30). Electronically Signed   By: Tish Frederickson M.D.   On: 07/24/2022 21:14   US Abdomen Limited RUQ (LIVER/GB)  Result Date: 07/05/2022 CLINICAL DATA:  Elevated liver function tests. EXAM: ULTRASOUND ABDOMEN LIMITED RIGHT UPPER QUADRANT COMPARISON:  Abdomen and pelvis CT dated 08/15/2007. FINDINGS: Gallbladder: No gallstones or  wall thickening visualized. No sonographic Murphy sign noted by sonographer. Common bile duct: Diameter: 3.6 mm Liver: No focal lesion identified. Within normal limits in parenchymal echogenicity. Portal vein is patent on color Doppler imaging with normal direction of blood flow towards the liver. Other: None. IMPRESSION: Normal examination. Electronically Signed   By: Beckie Salts M.D.   On: 07/05/2022 16:15   DG CHEST PORT 1 VIEW  Result Date: 07/04/2022 CLINICAL DATA:  77 year old male with hypoxia. Encephalopathy. Fever. EXAM: PORTABLE CHEST 1 VIEW COMPARISON:  Portable chest 06/30/2022 and earlier. FINDINGS: Portable AP semi upright view at 0623 hours. Enteric feeding tube courses to the abdomen, tip not included. Mildly lower lung volumes. Ongoing patchy nonspecific left lower lung opacity superimposed on cardiomegaly. But the left hemidiaphragm now is visible. Mildly lower lung volumes and increased interstitial opacity. Trace pleural fluid in the right minor fissure. No pneumothorax. No definite effusion. No acute osseous abnormality identified. IMPRESSION: 1. Lower lung volumes with increased generalized interstitial opacity. Cannot exclude mild or developing interstitial edema. 2. Patchy left lung base opacity although ventilation there appears improved since 06/30/2022. Favor atelectasis over other considerations. Electronically Signed   By: Odessa Fleming M.D.   On: 07/04/2022 09:53   VAS [place] LOWER EXTREMITY VENOUS (DVT)  Result Date: 07/02/2022  Lower Venous DVT Study Patient Name:  VADHIR MCNAY [facility]  Date of Exam:   07/02/2022 Medical Rec #: [id number]             Accession #:    [id number] Date of Birth: [birth year]            Patient Gender: M Patient Age:   28 years Exam Location:  [facility] Procedure:      VAS [place] LOWER EXTREMITY VENOUS (DVT) Referring Phys: Karie Fetch --------------------------------------------------------------------------------  Indications: Left lower extremity  erythema, pain, mild swelling.  Limitations: Body habitus. Comparison Study: No prior studies. Performing Technologist: Jean Rosenthal RDMS, RVT  Examination Guidelines: A complete evaluation includes B-mode imaging, spectral Doppler, color Doppler, and power Doppler  as needed of all accessible portions of each vessel. Bilateral testing is considered an integral part of a complete examination. Limited examinations for reoccurring indications may be performed as noted. The reflux portion of the exam is performed with the patient in reverse Trendelenburg.  +-----+---------------+---------+-----------+----------+--------------+ RIGHTCompressibilityPhasicitySpontaneityPropertiesThrombus Aging +-----+---------------+---------+-----------+----------+--------------+ CFV  Full           Yes      Yes                                 +-----+---------------+---------+-----------+----------+--------------+   +---------+---------------+---------+-----------+----------+--------------+ LEFT     CompressibilityPhasicitySpontaneityPropertiesThrombus Aging +---------+---------------+---------+-----------+----------+--------------+ CFV      Full           Yes      Yes                                 +---------+---------------+---------+-----------+----------+--------------+ SFJ      Full                                                        +---------+---------------+---------+-----------+----------+--------------+ FV Prox  Full                                                        +---------+---------------+---------+-----------+----------+--------------+ FV Mid   Full                                                        +---------+---------------+---------+-----------+----------+--------------+ FV DistalFull                                                        +---------+---------------+---------+-----------+----------+--------------+ PFV      Full                                                         +---------+---------------+---------+-----------+----------+--------------+ POP      Full           Yes      Yes                                 +---------+---------------+---------+-----------+----------+--------------+ PTV      Full                                                        +---------+---------------+---------+-----------+----------+--------------+ PERO  Full                                                        +---------+---------------+---------+-----------+----------+--------------+     Summary: RIGHT: - No evidence of common femoral vein obstruction.  LEFT: - There is no evidence of deep vein thrombosis in the lower extremity.  - No cystic structure found in the popliteal fossa.  *See table(s) above for measurements and observations. Electronically signed by Sherald Hess MD on 07/02/2022 at 2:21:59 PM.    Final    EEG adult  Result Date: 07/01/2022 Charlsie Quest, MD     07/01/2022  7:58 AM Patient Name: Dima Ferrufino MRN: [id number] Epilepsy Attending: Charlsie Quest Referring Physician/Provider: Lynnae January, NP Date: 07/01/2022 Duration: 30.58 mins Patient history: 77yo M with ams. EEG to evaluate for seizure Level of alertness: Awake AEDs during EEG study: VPA Technical aspects: This EEG study was done with scalp electrodes positioned according to the 10-20 International system of electrode placement. Electrical activity was reviewed with band pass filter of 1-70Hz , sensitivity of 7 uV/mm, display speed of 36mm/sec with a 60Hz  notched filter applied as appropriate. EEG data were recorded continuously and digitally stored.  Video monitoring was available and reviewed as appropriate. Description: The posterior dominant rhythm consists of 8 Hz activity of moderate voltage (25-35 uV) seen predominantly in posterior head regions, symmetric and reactive to eye opening and eye closing. EEG showed intermittent generalized 3 to  6 Hz theta-delta slowing. Physiologic photic driving was not seen during photic stimulation.  Hyperventilation was not performed.   ABNORMALITY - Intermittent slow, generalized IMPRESSION: This study is suggestive of mild diffuse encephalopathy, nonspecific etiology. No seizures or epileptiform discharges were seen throughout the recording. Priyanka Annabelle Harman   CT ANGIO HEAD NECK W WO CM (CODE STROKE)  Result Date: 06/30/2022 CLINICAL DATA:  Neuro deficit, acute, stroke suspected. Unresponsive. EXAM: CT ANGIOGRAPHY HEAD AND NECK TECHNIQUE: Multidetector CT imaging of the head and neck was performed using the standard protocol during bolus administration of intravenous contrast. Multiplanar CT image reconstructions and MIPs were obtained to evaluate the vascular anatomy. Carotid stenosis measurements (when applicable) are obtained utilizing NASCET criteria, using the distal internal carotid diameter as the denominator. RADIATION DOSE REDUCTION: This exam was performed according to the departmental dose-optimization program which includes automated exposure control, adjustment of the mA and/or kV according to patient size and/or use of iterative reconstruction technique. CONTRAST:  60mL OMNIPAQUE IOHEXOL 350 MG/ML SOLN COMPARISON:  None Available. FINDINGS: CTA NECK FINDINGS Aortic arch: Standard 3 vessel aortic arch with calcified atherosclerotic plaque. Mixed calcified and soft plaque in the proximal left subclavian artery resulting in less than 50% stenosis. Right carotid system: Patent with prominent calcified plaque at the carotid bifurcation. 65% stenosis of the distal common carotid artery. Less than 50% stenosis of the ICA origin. Left carotid system: Patent with scattered calcified and soft plaque throughout the common carotid artery not resulting in significant stenosis. Heavily calcified plaque in the proximal ICA resulting in 55% stenosis. Vertebral arteries: Patent and codominant. Atherosclerotic plaque  at the vertebral origins results in mild-to-moderate stenosis on the right and severe stenosis on the left. Skeleton: Cervical spondylosis with solid bridging vertebral osteophytes at C5-6 and C6-7. Other neck: No evidence of cervical lymphadenopathy or mass. Upper chest:  Mild dependent atelectasis and subpleural reticulation in the included upper lobes of the lungs. Partially visualized feeding tube. Review of the MIP images confirms the above findings CTA HEAD FINDINGS Anterior circulation: The internal carotid arteries are patent from skull base to carotid termini with calcified plaque resulting in mild-to-moderate bilateral paraclinoid stenoses. ACAs and MCAs are patent. There are mild right and severe left M1 stenoses, and there is moderate irregularity and attenuation of branch vessels bilaterally. No aneurysm is identified. Posterior circulation: The intracranial vertebral arteries are patent to the basilar with severe right and mild left V4 segment stenoses. Patent PICA and SCA origins are seen bilaterally. The basilar artery is widely patent. Posterior communicating arteries are diminutive or absent. There are severe bilateral P2 stenoses with asymmetrically severe attenuation of more distal right PCA branch vessels. No aneurysm is identified. Venous sinuses: Patent. Anatomic variants: None. Review of the MIP images confirms the above findings These results were communicated to Dr. Amada Jupiter at 5:18 pm on 06/30/2022 by text page via the [facility] messaging system. IMPRESSION: 1. No emergent large vessel occlusion. 2. Advanced atherosclerosis in the head and neck. 3. Severe left M1, right V4, and bilateral P2 stenoses. 4. 65% distal right common carotid artery stenosis. 5. 55% proximal left ICA stenosis. 6. Severe left and mild-to-moderate right vertebral artery origin stenoses. 7.  Aortic Atherosclerosis (ICD10-I70.0). Electronically Signed   By: Sebastian Ache M.D.   On: 06/30/2022 17:32   CT HEAD CODE STROKE  WO CONTRAST`  Result Date: 06/30/2022 CLINICAL DATA:  Code stroke.  Encephalopathy.  Slurred speech. EXAM: CT HEAD WITHOUT CONTRAST TECHNIQUE: Contiguous axial images were obtained from the base of the skull through the vertex without intravenous contrast. RADIATION DOSE REDUCTION: This exam was performed according to the departmental dose-optimization program which includes automated exposure control, adjustment of the mA and/or kV according to patient size and/or use of iterative reconstruction technique. COMPARISON:  Head CT 06/28/2022 FINDINGS: The study is mildly to moderately motion degraded despite repeat imaging. Brain: No definite acute infarct, intracranial hemorrhage, mass, midline shift, or extra-axial fluid collection is identified, with assessment limited by motion artifact (particularly at the vertex). Chronic infarcts are again noted in the high right frontal lobe and right cerebellar hemisphere. There is mild cerebral atrophy. Mild hypodensities in the cerebral white matter bilaterally are unchanged and nonspecific but compatible with chronic small vessel ischemic disease. Vascular: Calcified atherosclerosis at the skull base. No hyperdense vessel. Skull: No acute fracture or suspicious osseous lesion. Sinuses/Orbits: Minimal mucosal thickening in the left maxillary sinus. Clear mastoid air cells. Unremarkable orbits. Other: None. ASPECTS [facility] Stroke Program Early CT Score) - Ganglionic level infarction (caudate, lentiform nuclei, internal capsule, insula, M1-M3 cortex): 7 - Supraganglionic infarction (M4-M6 cortex): 3 Total score (0-10 with 10 being normal): 10 IMPRESSION: 1. No evidence of an acute intracranial abnormality within limitations of motion. 2. 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 These results were communicated to Dr. Amada Jupiter at 4:25 pm on 06/30/2022 by text page via the [facility] messaging system. Electronically Signed   By: Sebastian Ache M.D.   On:  06/30/2022 16:25      Subjective: No significant events overnight as discussed with staff, daughter at bedside report patient comfortable, with no acute issues overnight.  Discharge Exam: Vitals:   07/30/22 0500 07/30/22 0800  BP: (!) 142/62   Pulse: (!) 101   Resp: 17 18  Temp: 97.9 F (36.6 C)   SpO2:     Vitals:  07/29/22 1543 07/30/22 0028 07/30/22 0500 07/30/22 0800  BP: 130/88 133/70 (!) 142/62   Pulse: 92 99 (!) 101   Resp: (!) 29 20 17 18   Temp: 97.6 F (36.4 C) 99 F (37.2 C) 97.9 F (36.6 C)   TempSrc: Oral  Axillary   SpO2: 100% 99%      Appears to be comfortable, no apparent distress, respond to verbal stimuli Symmetrical Chest wall movement, Good air movement bilaterally RRR,No Gallops +ve B.Sounds, Abd Soft No Cyanosis, Clubbing or edema    The results of significant diagnostics from this hospitalization (including imaging, microbiology, ancillary and laboratory) are listed below for reference.     Microbiology: Recent Results (from the past 240 hour(s))  Resp panel by RT-PCR (RSV, Flu A&B, Covid) Anterior Nasal Swab     Status: None   Collection Time: 07/28/22  6:06 AM   Specimen: Anterior Nasal Swab  Result Value Ref Range Status   SARS Coronavirus 2 by RT PCR NEGATIVE NEGATIVE Final   Influenza A by PCR NEGATIVE NEGATIVE Final   Influenza B by PCR NEGATIVE NEGATIVE Final    Comment: (NOTE) The Xpert Xpress SARS-CoV-2/FLU/RSV plus assay is intended as an aid in the diagnosis of influenza from Nasopharyngeal swab specimens and should not be used as a sole basis for treatment. Nasal washings and aspirates are unacceptable for Xpert Xpress SARS-CoV-2/FLU/RSV testing.  Fact Sheet for Patients: BloggerCourse.com  Fact Sheet for Healthcare Providers: SeriousBroker.it  This test is not yet approved or cleared by the [place] FDA and has been authorized for detection and/or diagnosis of  SARS-CoV-2 by FDA under an Emergency Use Authorization (EUA). This EUA will remain in effect (meaning this test can be used) for the duration of the COVID-19 declaration under Section 564(b)(1) of the Act, 21 U.S.C. section 360bbb-3(b)(1), unless the authorization is terminated or revoked.     Resp Syncytial Virus by PCR NEGATIVE NEGATIVE Final    Comment: (NOTE) Fact Sheet for Patients: BloggerCourse.com  Fact Sheet for Healthcare Providers: SeriousBroker.it  This test is not yet approved or cleared by the [place] FDA and has been authorized for detection and/or diagnosis of SARS-CoV-2 by FDA under an Emergency Use Authorization (EUA). This EUA will remain in effect (meaning this test can be used) for the duration of the COVID-19 declaration under Section 564(b)(1) of the Act, 21 U.S.C. section 360bbb-3(b)(1), unless the authorization is terminated or revoked.  Performed at [facility], [address] [place], [place] [address]   Culture, blood (Routine X 2) w Reflex to ID Panel     Status: None (Preliminary result)   Collection Time: 07/29/22 10:36 AM   Specimen: BLOOD LEFT ARM  Result Value Ref Range Status   Specimen Description BLOOD LEFT ARM  Final   Special Requests   Final    BOTTLES DRAWN AEROBIC AND ANAEROBIC Blood Culture adequate volume   Culture   Final    NO GROWTH 1 DAY Performed at [facility], [address] [place], [place] [address]    Report Status PENDING  Incomplete  Culture, blood (Routine X 2) w Reflex to ID Panel     Status: None (Preliminary result)   Collection Time: 07/29/22 10:36 AM   Specimen: BLOOD LEFT ARM  Result Value Ref Range Status   Specimen Description BLOOD LEFT ARM  Final   Special Requests   Final    BOTTLES DRAWN AEROBIC AND ANAEROBIC Blood Culture adequate volume   Culture  Final    NO GROWTH 1 DAY Performed at [facility], [address],  [place], [place] [address]    Report Status PENDING  Incomplete  Urine Culture     Status: None   Collection Time: 07/29/22 12:55 PM   Specimen: Urine, Catheterized  Result Value Ref Range Status   Specimen Description URINE, CATHETERIZED  Final   Special Requests NONE Reflexed from [id number]  Final   Culture   Final    NO GROWTH Performed at [facility], [address] [place], [place] [address]    Report Status 07/30/2022 FINAL  Final     Labs: BNP (last 3 results) Recent Labs    06/20/22 0245 07/28/22 0558  BNP 431.6* 229.7*   Basic Metabolic Panel: Recent Labs  Lab 07/24/22 1955 07/28/22 0558 07/28/22 0624 07/29/22 0544  NA 138 135 136 139  K 3.7 3.1* 3.1* 3.1*  CL 98 97*  --  102  CO2 25 23  --  26  GLUCOSE 101* 110*  --  96  BUN 26* 25*  --  14  CREATININE 1.52* 1.56*  --  1.11  CALCIUM 8.9 7.9*  --  7.8*   Liver Function Tests: Recent Labs  Lab 07/24/22 1955 07/28/22 0558  AST 24 22  ALT 25 21  ALKPHOS 74 84  BILITOT 1.2 0.9  PROT 6.9 5.8*  ALBUMIN 2.7* 2.3*   No results for input(s): "LIPASE", "AMYLASE" in the last 168 hours. Recent Labs  Lab 07/28/22 1514  AMMONIA 18   CBC: Recent Labs  Lab 07/24/22 1955 07/28/22 0558 07/28/22 0624 07/29/22 0544  WBC 14.7* 19.9*  --  18.4*  NEUTROABS 11.5* 16.2*  --   --   HGB 12.2* 10.6* 10.9* 10.8*  HCT 36.6* 31.3* 32.0* 33.2*  MCV 101.4* 98.7  --  101.8*  PLT 241 226  --  268   Cardiac Enzymes: No results for input(s): "CKTOTAL", "CKMB", "CKMBINDEX", "TROPONINI" in the last 168 hours. BNP: Invalid input(s): "POCBNP" CBG: Recent Labs  Lab 07/28/22 2355 07/29/22 0357 07/29/22 0745 07/29/22 1140 07/29/22 1541  GLUCAP 90 97 95 96 102*   D-Dimer Recent Labs    07/28/22 0558  DDIMER 2.78*   Hgb A1c No results for input(s): "HGBA1C" in the last 72 hours. Lipid Profile No results for input(s): "CHOL", "HDL", "LDLCALC", "TRIG", "CHOLHDL", "LDLDIRECT" in the last 72 hours. Thyroid  function studies No results for input(s): "TSH", "T4TOTAL", "T3FREE", "THYROIDAB" in the last 72 hours.  Invalid input(s): "FREET3" Anemia work up No results for input(s): "VITAMINB12", "FOLATE", "FERRITIN", "TIBC", "IRON", "RETICCTPCT" in the last 72 hours. Urinalysis    Component Value Date/Time   COLORURINE YELLOW 07/29/2022 1255   APPEARANCEUR CLOUDY (A) 07/29/2022 1255   LABSPEC 1.017 07/29/2022 1255   PHURINE 5.0 07/29/2022 1255   GLUCOSEU NEGATIVE 07/29/2022 1255   HGBUR LARGE (A) 07/29/2022 1255   BILIRUBINUR NEGATIVE 07/29/2022 1255   KETONESUR 5 (A) 07/29/2022 1255   PROTEINUR 100 (A) 07/29/2022 1255   UROBILINOGEN 0.2 08/15/2007 1011   NITRITE NEGATIVE 07/29/2022 1255   LEUKOCYTESUR SMALL (A) 07/29/2022 1255   Sepsis Labs Recent Labs  Lab 07/24/22 1955 07/28/22 0558 07/29/22 0544  WBC 14.7* 19.9* 18.4*   Microbiology Recent Results (from the past 240 hour(s))  Resp panel by RT-PCR (RSV, Flu A&B, Covid) Anterior Nasal Swab     Status: None   Collection Time: 07/28/22  6:06 AM   Specimen: Anterior Nasal Swab  Result  Value Ref Range Status   SARS Coronavirus 2 by RT PCR NEGATIVE NEGATIVE Final   Influenza A by PCR NEGATIVE NEGATIVE Final   Influenza B by PCR NEGATIVE NEGATIVE Final    Comment: (NOTE) The Xpert Xpress SARS-CoV-2/FLU/RSV plus assay is intended as an aid in the diagnosis of influenza from Nasopharyngeal swab specimens and should not be used as a sole basis for treatment. Nasal washings and aspirates are unacceptable for Xpert Xpress SARS-CoV-2/FLU/RSV testing.  Fact Sheet for Patients: BloggerCourse.com  Fact Sheet for Healthcare Providers: SeriousBroker.it  This test is not yet approved or cleared by the [place] FDA and has been authorized for detection and/or diagnosis of SARS-CoV-2 by FDA under an Emergency Use Authorization (EUA). This EUA will remain in effect (meaning this test  can be used) for the duration of the COVID-19 declaration under Section 564(b)(1) of the Act, 21 U.S.C. section 360bbb-3(b)(1), unless the authorization is terminated or revoked.     Resp Syncytial Virus by PCR NEGATIVE NEGATIVE Final    Comment: (NOTE) Fact Sheet for Patients: BloggerCourse.com  Fact Sheet for Healthcare Providers: SeriousBroker.it  This test is not yet approved or cleared by the [place] FDA and has been authorized for detection and/or diagnosis of SARS-CoV-2 by FDA under an Emergency Use Authorization (EUA). This EUA will remain in effect (meaning this test can be used) for the duration of the COVID-19 declaration under Section 564(b)(1) of the Act, 21 U.S.C. section 360bbb-3(b)(1), unless the authorization is terminated or revoked.  Performed at [facility], [address], [place], [place] [address]   Culture, blood (Routine X 2) w Reflex to ID Panel     Status: None (Preliminary result)   Collection Time: 07/29/22 10:36 AM   Specimen: BLOOD LEFT ARM  Result Value Ref Range Status   Specimen Description BLOOD LEFT ARM  Final   Special Requests   Final    BOTTLES DRAWN AEROBIC AND ANAEROBIC Blood Culture adequate volume   Culture   Final    NO GROWTH 1 DAY Performed at [facility], [address] [place], [place] [address]    Report Status PENDING  Incomplete  Culture, blood (Routine X 2) w Reflex to ID Panel     Status: None (Preliminary result)   Collection Time: 07/29/22 10:36 AM   Specimen: BLOOD LEFT ARM  Result Value Ref Range Status   Specimen Description BLOOD LEFT ARM  Final   Special Requests   Final    BOTTLES DRAWN AEROBIC AND ANAEROBIC Blood Culture adequate volume   Culture   Final    NO GROWTH 1 DAY Performed at [facility], [address] [place], [place] [address]    Report Status PENDING  Incomplete  Urine Culture     Status: None   Collection Time:  07/29/22 12:55 PM   Specimen: Urine, Catheterized  Result Value Ref Range Status   Specimen Description URINE, CATHETERIZED  Final   Special Requests NONE Reflexed from [id number]  Final   Culture   Final    NO GROWTH Performed at [facility], [address], [place], [place] [address]    Report Status 07/30/2022 FINAL  Final     Time coordinating discharge: Over 30 minutes  SIGNED:   Huey Bienenstock, MD  [facility] 07/30/2022, 11:44 AM Pager   If 7PM-7AM, please contact night-coverage [email or web address]

## 2022-07-30 NOTE — Discharge Instructions (Signed)
Management per [facility]

## 2022-07-31 LAB — CULTURE, BLOOD (ROUTINE X 2)

## 2022-08-01 LAB — CULTURE, BLOOD (ROUTINE X 2)
Special Requests: ADEQUATE
Special Requests: ADEQUATE

## 2022-08-03 LAB — CULTURE, BLOOD (ROUTINE X 2): Culture: NO GROWTH

## 2022-08-29 DEATH — deceased

## 2022-11-02 ENCOUNTER — Ambulatory Visit: Payer: Non-veteran care | Admitting: Cardiology
